# Patient Record
Sex: Female | Born: 1955 | Race: Black or African American | Hispanic: No | Marital: Single | Smoking: Former smoker
Health system: Southern US, Community
[De-identification: ages and names within clinical notes are randomized; demographics above are authoritative.]

## PROBLEM LIST (undated history)

## (undated) DIAGNOSIS — M1990 Unspecified osteoarthritis, unspecified site: Secondary | ICD-10-CM

## (undated) DIAGNOSIS — Z87898 Personal history of other specified conditions: Secondary | ICD-10-CM

## (undated) DIAGNOSIS — I509 Heart failure, unspecified: Secondary | ICD-10-CM

## (undated) DIAGNOSIS — I10 Essential (primary) hypertension: Secondary | ICD-10-CM

## (undated) DIAGNOSIS — I4891 Unspecified atrial fibrillation: Secondary | ICD-10-CM

## (undated) DIAGNOSIS — J449 Chronic obstructive pulmonary disease, unspecified: Secondary | ICD-10-CM

## (undated) DIAGNOSIS — G5600 Carpal tunnel syndrome, unspecified upper limb: Secondary | ICD-10-CM

## (undated) DIAGNOSIS — E876 Hypokalemia: Secondary | ICD-10-CM

## (undated) DIAGNOSIS — F329 Major depressive disorder, single episode, unspecified: Secondary | ICD-10-CM

## (undated) DIAGNOSIS — D696 Thrombocytopenia, unspecified: Secondary | ICD-10-CM

## (undated) HISTORY — DX: Carpal tunnel syndrome, unspecified upper limb: G56.00

## (undated) HISTORY — DX: Unspecified osteoarthritis, unspecified site: M19.90

## (undated) HISTORY — DX: Personal history of other specified conditions: Z87.898

## (undated) HISTORY — PX: CARDIAC CATHETERIZATION: SHX172

## (undated) HISTORY — DX: Chronic obstructive pulmonary disease, unspecified: J44.9

## (undated) HISTORY — DX: Thrombocytopenia, unspecified: D69.6

## (undated) HISTORY — DX: Major depressive disorder, single episode, unspecified: F32.9

## (undated) HISTORY — DX: Unspecified atrial fibrillation: I48.91

## (undated) HISTORY — PX: PITUITARY SURGERY: SHX203

## (undated) HISTORY — DX: Hypokalemia: E87.6

## (undated) HISTORY — DX: Heart failure, unspecified: I50.9

## (undated) HISTORY — DX: Essential (primary) hypertension: I10

## (undated) HISTORY — PX: UTERINE FIBROID SURGERY: SHX826

---

## 2002-02-27 LAB — HM MAMMOGRAPHY

## 2004-07-15 ENCOUNTER — Emergency Department: Payer: Self-pay | Admitting: Emergency Medicine

## 2004-07-24 ENCOUNTER — Emergency Department: Payer: Self-pay | Admitting: Emergency Medicine

## 2006-05-26 ENCOUNTER — Inpatient Hospital Stay: Payer: Self-pay | Admitting: Internal Medicine

## 2006-05-26 ENCOUNTER — Other Ambulatory Visit: Payer: Self-pay

## 2007-10-03 ENCOUNTER — Inpatient Hospital Stay: Payer: Self-pay | Admitting: *Deleted

## 2007-10-03 ENCOUNTER — Other Ambulatory Visit: Payer: Self-pay

## 2007-10-16 ENCOUNTER — Ambulatory Visit: Payer: Self-pay | Admitting: Family

## 2007-10-29 ENCOUNTER — Other Ambulatory Visit: Payer: Self-pay

## 2007-10-29 ENCOUNTER — Emergency Department: Payer: Self-pay | Admitting: Emergency Medicine

## 2008-04-22 ENCOUNTER — Ambulatory Visit: Payer: Self-pay

## 2008-09-20 ENCOUNTER — Ambulatory Visit: Payer: Self-pay

## 2008-12-31 ENCOUNTER — Ambulatory Visit: Payer: Self-pay | Admitting: Cardiology

## 2009-01-01 ENCOUNTER — Ambulatory Visit: Payer: Self-pay | Admitting: Cardiology

## 2009-01-16 ENCOUNTER — Ambulatory Visit: Payer: Self-pay | Admitting: Specialist

## 2009-01-23 ENCOUNTER — Ambulatory Visit: Payer: Self-pay | Admitting: Specialist

## 2009-08-22 ENCOUNTER — Ambulatory Visit: Payer: Self-pay

## 2009-09-21 ENCOUNTER — Ambulatory Visit: Payer: Self-pay

## 2009-10-03 ENCOUNTER — Ambulatory Visit: Payer: Self-pay

## 2009-10-10 ENCOUNTER — Ambulatory Visit: Payer: Self-pay

## 2010-06-13 ENCOUNTER — Emergency Department: Payer: Self-pay | Admitting: Emergency Medicine

## 2010-09-14 ENCOUNTER — Inpatient Hospital Stay: Payer: Self-pay | Admitting: Internal Medicine

## 2010-09-21 ENCOUNTER — Inpatient Hospital Stay: Payer: Self-pay | Admitting: Specialist

## 2010-11-20 ENCOUNTER — Ambulatory Visit: Payer: Self-pay | Admitting: Specialist

## 2011-02-09 ENCOUNTER — Ambulatory Visit: Payer: Self-pay | Admitting: Internal Medicine

## 2011-02-15 ENCOUNTER — Ambulatory Visit: Payer: Self-pay | Admitting: Internal Medicine

## 2011-02-22 ENCOUNTER — Ambulatory Visit: Payer: Self-pay | Admitting: Internal Medicine

## 2012-01-07 DIAGNOSIS — Z8742 Personal history of other diseases of the female genital tract: Secondary | ICD-10-CM | POA: Insufficient documentation

## 2012-01-07 DIAGNOSIS — R9389 Abnormal findings on diagnostic imaging of other specified body structures: Secondary | ICD-10-CM | POA: Insufficient documentation

## 2012-01-07 DIAGNOSIS — Z9889 Other specified postprocedural states: Secondary | ICD-10-CM | POA: Insufficient documentation

## 2012-01-07 DIAGNOSIS — I429 Cardiomyopathy, unspecified: Secondary | ICD-10-CM | POA: Insufficient documentation

## 2012-02-07 ENCOUNTER — Ambulatory Visit: Payer: Self-pay | Admitting: Cardiology

## 2012-02-07 LAB — APTT: Activated PTT: 32 secs (ref 23.6–35.9)

## 2012-02-07 LAB — BASIC METABOLIC PANEL
Anion Gap: 7 (ref 7–16)
BUN: 5 mg/dL — ABNORMAL LOW (ref 7–18)
Calcium, Total: 8.7 mg/dL (ref 8.5–10.1)
EGFR (African American): >60
EGFR (Non-African Amer.): >60
Glucose: 91 mg/dL (ref 65–99)
Potassium: 4.1 mmol/L (ref 3.5–5.1)
Sodium: 134 mmol/L — ABNORMAL LOW (ref 136–145)

## 2012-02-07 LAB — URINALYSIS, COMPLETE
Bacteria: NONE SEEN
Glucose,UR: NEGATIVE mg/dL (ref 0–75)
Leukocyte Esterase: NEGATIVE
Nitrite: NEGATIVE
RBC,UR: 1 /HPF (ref 0–5)
Specific Gravity: 1.005 (ref 1.003–1.030)
WBC UR: NONE SEEN /HPF (ref 0–5)

## 2012-02-07 LAB — CBC WITH DIFFERENTIAL/PLATELET
Basophil %: 0.6 %
Eosinophil #: 0.1 10*3/uL (ref 0.0–0.7)
Eosinophil %: 3.6 %
HGB: 14.6 g/dL (ref 12.0–16.0)
MCH: 36.7 pg — ABNORMAL HIGH (ref 26.0–34.0)
MCV: 108 fL — ABNORMAL HIGH (ref 80–100)
Monocyte #: 0.5 x10 3/mm (ref 0.2–0.9)
Neutrophil #: 1.9 10*3/uL (ref 1.4–6.5)
Neutrophil %: 47.8 %
RBC: 3.99 10*6/uL (ref 3.80–5.20)

## 2012-02-07 LAB — PROTIME-INR: Prothrombin Time: 15.7 secs — ABNORMAL HIGH (ref 11.5–14.7)

## 2012-03-25 ENCOUNTER — Observation Stay: Payer: Self-pay | Admitting: Internal Medicine

## 2012-03-25 LAB — TROPONIN I
Troponin-I: 0.08 ng/mL — ABNORMAL HIGH
Troponin-I: 0.08 ng/mL — ABNORMAL HIGH

## 2012-03-25 LAB — COMPREHENSIVE METABOLIC PANEL
Albumin: 3.7 g/dL (ref 3.4–5.0)
Alkaline Phosphatase: 71 U/L (ref 50–136)
Bilirubin,Total: 0.6 mg/dL (ref 0.2–1.0)
Calcium, Total: 8.7 mg/dL (ref 8.5–10.1)
Chloride: 107 mmol/L (ref 98–107)
Co2: 24 mmol/L (ref 21–32)
Creatinine: 0.59 mg/dL — ABNORMAL LOW (ref 0.60–1.30)
EGFR (Non-African Amer.): >60
Potassium: 4.2 mmol/L (ref 3.5–5.1)
SGOT(AST): 29 U/L (ref 15–37)
SGPT (ALT): 16 U/L (ref 12–78)
Sodium: 141 mmol/L (ref 136–145)
Total Protein: 7.8 g/dL (ref 6.4–8.2)

## 2012-03-25 LAB — DRUG SCREEN, URINE
Amphetamines, Ur Screen: NEGATIVE (ref ?–1000)
Barbiturates, Ur Screen: NEGATIVE (ref ?–200)
MDMA (Ecstasy)Ur Screen: NEGATIVE (ref ?–500)
Methadone, Ur Screen: NEGATIVE (ref ?–300)
Opiate, Ur Screen: NEGATIVE (ref ?–300)
Tricyclic, Ur Screen: NEGATIVE (ref ?–1000)

## 2012-03-25 LAB — CBC
MCH: 37.7 pg — ABNORMAL HIGH (ref 26.0–34.0)
MCHC: 34.4 g/dL (ref 32.0–36.0)
MCV: 110 fL — ABNORMAL HIGH (ref 80–100)
Platelet: 167 10*3/uL (ref 150–440)
RDW: 16.4 % — ABNORMAL HIGH (ref 11.5–14.5)

## 2012-03-25 LAB — CK TOTAL AND CKMB (NOT AT ARMC)
CK, Total: 91 U/L (ref 21–215)
CK-MB: 2.4 ng/mL (ref 0.5–3.6)

## 2012-03-25 LAB — DIGOXIN LEVEL: Digoxin: 1.22 ng/mL

## 2012-03-25 LAB — URINALYSIS, COMPLETE
Bilirubin,UR: NEGATIVE
Blood: NEGATIVE
Leukocyte Esterase: NEGATIVE
Nitrite: NEGATIVE
Ph: 5 (ref 4.5–8.0)
Protein: NEGATIVE
RBC,UR: NONE SEEN /HPF (ref 0–5)
Squamous Epithelial: 26

## 2012-03-25 LAB — ETHANOL
Ethanol %: 0.287 % — ABNORMAL HIGH (ref 0.000–0.080)
Ethanol: 287 mg/dL

## 2012-03-25 LAB — LIPASE, BLOOD: Lipase: 109 U/L

## 2012-03-26 LAB — CBC WITH DIFFERENTIAL/PLATELET
Basophil %: 0.6 %
Eosinophil #: 0.1 10*3/uL (ref 0.0–0.7)
Eosinophil %: 2.6 %
HGB: 14.5 g/dL (ref 12.0–16.0)
Lymphocyte #: 2.2 10*3/uL (ref 1.0–3.6)
Lymphocyte %: 48.6 %
Monocyte #: 0.4 x10 3/mm (ref 0.2–0.9)
Monocyte %: 9.4 %
Neutrophil #: 1.8 10*3/uL (ref 1.4–6.5)
Neutrophil %: 38.8 %
Platelet: 136 10*3/uL — ABNORMAL LOW (ref 150–440)
RBC: 3.95 10*6/uL (ref 3.80–5.20)

## 2012-03-26 LAB — BASIC METABOLIC PANEL
Anion Gap: 8 (ref 7–16)
BUN: 9 mg/dL (ref 7–18)
Calcium, Total: 8.7 mg/dL (ref 8.5–10.1)
Chloride: 109 mmol/L — ABNORMAL HIGH (ref 98–107)
Creatinine: 0.63 mg/dL (ref 0.60–1.30)
EGFR (African American): >60
EGFR (Non-African Amer.): >60
Glucose: 90 mg/dL (ref 65–99)
Potassium: 3.6 mmol/L (ref 3.5–5.1)

## 2012-03-26 LAB — CK TOTAL AND CKMB (NOT AT ARMC): CK-MB: 0.9 ng/mL (ref 0.5–3.6)

## 2012-03-26 LAB — MAGNESIUM: Magnesium: 1.6 mg/dL — ABNORMAL LOW

## 2012-03-26 LAB — LIPID PANEL
Cholesterol: 161 mg/dL (ref 0–200)
Ldl Cholesterol, Calc: 73 mg/dL (ref 0–100)
Triglycerides: 149 mg/dL (ref 0–200)

## 2013-03-02 ENCOUNTER — Emergency Department: Payer: Self-pay | Admitting: Emergency Medicine

## 2013-03-02 LAB — CBC
HGB: 14.1 g/dL (ref 12.0–16.0)
MCH: 36.8 pg — ABNORMAL HIGH (ref 26.0–34.0)
MCHC: 34.2 g/dL (ref 32.0–36.0)
RBC: 3.84 10*6/uL (ref 3.80–5.20)

## 2013-03-02 LAB — BASIC METABOLIC PANEL
Calcium, Total: 8.9 mg/dL (ref 8.5–10.1)
Chloride: 106 mmol/L (ref 98–107)
Co2: 23 mmol/L (ref 21–32)
Creatinine: 0.53 mg/dL — ABNORMAL LOW (ref 0.60–1.30)
EGFR (African American): >60
Osmolality: 272 (ref 275–301)

## 2013-09-21 ENCOUNTER — Ambulatory Visit
Admit: 2013-09-21 | Disposition: A | Discharge status: Home / Self Care | Payer: Self-pay | Attending: Nurse Practitioner | Admitting: Nurse Practitioner

## 2013-09-21 ENCOUNTER — Ambulatory Visit: Payer: Self-pay | Admitting: Internal Medicine

## 2013-09-25 ENCOUNTER — Inpatient Hospital Stay: Payer: Self-pay | Admitting: Internal Medicine

## 2013-09-25 LAB — URINALYSIS, COMPLETE
BILIRUBIN, UR: NEGATIVE
Blood: NEGATIVE
Glucose,UR: NEGATIVE mg/dL (ref 0–75)
Hyaline Cast: 42
Ketone: NEGATIVE
Leukocyte Esterase: NEGATIVE
Nitrite: NEGATIVE
Ph: 5 (ref 4.5–8.0)
Protein: 100
SPECIFIC GRAVITY: 1.015 (ref 1.003–1.030)
Squamous Epithelial: 2
WBC UR: 4 /HPF (ref 0–5)

## 2013-09-25 LAB — TROPONIN I
Troponin-I: 0.08 ng/mL — ABNORMAL HIGH
Troponin-I: 0.09 ng/mL — ABNORMAL HIGH
Troponin-I: 0.06 ng/mL — ABNORMAL HIGH

## 2013-09-25 LAB — COMPREHENSIVE METABOLIC PANEL
ALT: 289 U/L — AB (ref 12–78)
ANION GAP: 12 (ref 7–16)
AST: 463 U/L — AB (ref 15–37)
Albumin: 4.1 g/dL (ref 3.4–5.0)
Alkaline Phosphatase: 118 U/L — ABNORMAL HIGH
BUN: 25 mg/dL — AB (ref 7–18)
Bilirubin,Total: 5.6 mg/dL — ABNORMAL HIGH (ref 0.2–1.0)
CALCIUM: 9.6 mg/dL (ref 8.5–10.1)
CHLORIDE: 88 mmol/L — AB (ref 98–107)
CREATININE: 1.57 mg/dL — AB (ref 0.60–1.30)
Co2: 19 mmol/L — ABNORMAL LOW (ref 21–32)
EGFR (African American): 41 — ABNORMAL LOW
EGFR (Non-African Amer.): 35 — ABNORMAL LOW
Glucose: 74 mg/dL (ref 65–99)
Osmolality: 243 (ref 275–301)
POTASSIUM: 4.4 mmol/L (ref 3.5–5.1)
SODIUM: 119 mmol/L — AB (ref 136–145)
Total Protein: 7.5 g/dL (ref 6.4–8.2)

## 2013-09-25 LAB — CBC
HCT: 40.9 % (ref 35.0–47.0)
HGB: 13.6 g/dL (ref 12.0–16.0)
MCH: 36 pg — ABNORMAL HIGH (ref 26.0–34.0)
MCHC: 33.3 g/dL (ref 32.0–36.0)
MCV: 108 fL — ABNORMAL HIGH (ref 80–100)
Platelet: 77 10*3/uL — ABNORMAL LOW (ref 150–440)
RBC: 3.79 10*6/uL — AB (ref 3.80–5.20)
RDW: 18.2 % — ABNORMAL HIGH (ref 11.5–14.5)
WBC: 4.4 10*3/uL (ref 3.6–11.0)

## 2013-09-25 LAB — SODIUM
Sodium: 121 mmol/L — ABNORMAL LOW (ref 136–145)
Sodium: 120 mmol/L — CL (ref 136–145)

## 2013-09-26 LAB — TSH: THYROID STIMULATING HORM: 1.73 u[IU]/mL

## 2013-09-26 LAB — COMPREHENSIVE METABOLIC PANEL
ALK PHOS: 98 U/L
Albumin: 3.2 g/dL — ABNORMAL LOW (ref 3.4–5.0)
Anion Gap: 10 (ref 7–16)
BUN: 23 mg/dL — ABNORMAL HIGH (ref 7–18)
Bilirubin,Total: 4.2 mg/dL — ABNORMAL HIGH (ref 0.2–1.0)
CALCIUM: 9 mg/dL (ref 8.5–10.1)
CHLORIDE: 92 mmol/L — AB (ref 98–107)
Co2: 20 mmol/L — ABNORMAL LOW (ref 21–32)
Creatinine: 1.54 mg/dL — ABNORMAL HIGH (ref 0.60–1.30)
EGFR (African American): 42 — ABNORMAL LOW
GFR CALC NON AF AMER: 36 — AB
Glucose: 104 mg/dL — ABNORMAL HIGH (ref 65–99)
OSMOLALITY: 250 (ref 275–301)
Potassium: 3.7 mmol/L (ref 3.5–5.1)
SGOT(AST): 258 U/L — ABNORMAL HIGH (ref 15–37)
SGPT (ALT): 200 U/L — ABNORMAL HIGH (ref 12–78)
Sodium: 122 mmol/L — ABNORMAL LOW (ref 136–145)
TOTAL PROTEIN: 6.2 g/dL — AB (ref 6.4–8.2)

## 2013-09-26 LAB — PROTIME-INR
INR: 1.7
Prothrombin Time: 19.9 secs — ABNORMAL HIGH (ref 11.5–14.7)

## 2013-09-26 LAB — CBC WITH DIFFERENTIAL/PLATELET
BASOS ABS: 0 10*3/uL (ref 0.0–0.1)
BASOS PCT: 1.1 %
EOS PCT: 1.8 %
Eosinophil #: 0.1 10*3/uL (ref 0.0–0.7)
HCT: 37.6 % (ref 35.0–47.0)
HGB: 12.6 g/dL (ref 12.0–16.0)
Lymphocyte #: 0.4 10*3/uL — ABNORMAL LOW (ref 1.0–3.6)
Lymphocyte %: 10.8 %
MCH: 36.1 pg — AB (ref 26.0–34.0)
MCHC: 33.6 g/dL (ref 32.0–36.0)
MCV: 108 fL — AB (ref 80–100)
MONOS PCT: 10.9 %
Monocyte #: 0.4 x10 3/mm (ref 0.2–0.9)
NEUTROS ABS: 2.5 10*3/uL (ref 1.4–6.5)
Neutrophil %: 75.4 %
Platelet: 63 10*3/uL — ABNORMAL LOW (ref 150–440)
RBC: 3.49 10*6/uL — ABNORMAL LOW (ref 3.80–5.20)
RDW: 17.8 % — AB (ref 11.5–14.5)
WBC: 3.3 10*3/uL — ABNORMAL LOW (ref 3.6–11.0)

## 2013-09-26 LAB — SODIUM
Sodium: 123 mmol/L — ABNORMAL LOW (ref 136–145)
Sodium: 125 mmol/L — ABNORMAL LOW (ref 136–145)

## 2013-09-26 LAB — MAGNESIUM: MAGNESIUM: 1.6 mg/dL — AB

## 2013-09-27 LAB — COMPREHENSIVE METABOLIC PANEL
ALK PHOS: 95 U/L
ALT: 160 U/L — AB (ref 12–78)
ANION GAP: 9 (ref 7–16)
Albumin: 3 g/dL — ABNORMAL LOW (ref 3.4–5.0)
BILIRUBIN TOTAL: 2.9 mg/dL — AB (ref 0.2–1.0)
BUN: 25 mg/dL — ABNORMAL HIGH (ref 7–18)
CO2: 20 mmol/L — AB (ref 21–32)
Calcium, Total: 8.7 mg/dL (ref 8.5–10.1)
Chloride: 93 mmol/L — ABNORMAL LOW (ref 98–107)
Creatinine: 1.52 mg/dL — ABNORMAL HIGH (ref 0.60–1.30)
EGFR (African American): 43 — ABNORMAL LOW
EGFR (Non-African Amer.): 37 — ABNORMAL LOW
Glucose: 100 mg/dL — ABNORMAL HIGH (ref 65–99)
OSMOLALITY: 250 (ref 275–301)
Potassium: 3.6 mmol/L (ref 3.5–5.1)
SGOT(AST): 149 U/L — ABNORMAL HIGH (ref 15–37)
Sodium: 122 mmol/L — ABNORMAL LOW (ref 136–145)
Total Protein: 6.2 g/dL — ABNORMAL LOW (ref 6.4–8.2)

## 2013-09-27 LAB — URINE CULTURE

## 2013-09-27 LAB — SODIUM: SODIUM: 122 mmol/L — AB (ref 136–145)

## 2013-09-28 LAB — BASIC METABOLIC PANEL
ANION GAP: 8 (ref 7–16)
Anion Gap: 11 (ref 7–16)
BUN: 24 mg/dL — AB (ref 7–18)
BUN: 23 mg/dL — ABNORMAL HIGH (ref 7–18)
CALCIUM: 9 mg/dL (ref 8.5–10.1)
CO2: 21 mmol/L (ref 21–32)
CREATININE: 1.51 mg/dL — AB (ref 0.60–1.30)
Calcium, Total: 8.7 mg/dL (ref 8.5–10.1)
Chloride: 91 mmol/L — ABNORMAL LOW (ref 98–107)
Chloride: 92 mmol/L — ABNORMAL LOW (ref 98–107)
Co2: 24 mmol/L (ref 21–32)
Creatinine: 1.34 mg/dL — ABNORMAL HIGH (ref 0.60–1.30)
EGFR (African American): 43 — ABNORMAL LOW
EGFR (Non-African Amer.): 43 — ABNORMAL LOW
EGFR (Non-African Amer.): 37 — ABNORMAL LOW
GFR CALC AF AMER: 50 — AB
Glucose: 88 mg/dL (ref 65–99)
Glucose: 107 mg/dL — ABNORMAL HIGH (ref 65–99)
Osmolality: 251 (ref 275–301)
Osmolality: 254 (ref 275–301)
POTASSIUM: 3.5 mmol/L (ref 3.5–5.1)
Potassium: 3.7 mmol/L (ref 3.5–5.1)
SODIUM: 123 mmol/L — AB (ref 136–145)
Sodium: 124 mmol/L — ABNORMAL LOW (ref 136–145)

## 2013-09-28 LAB — SODIUM: SODIUM: 124 mmol/L — AB (ref 136–145)

## 2013-09-29 LAB — BASIC METABOLIC PANEL
ANION GAP: 11 (ref 7–16)
BUN: 25 mg/dL — AB (ref 7–18)
CALCIUM: 8.8 mg/dL (ref 8.5–10.1)
CO2: 23 mmol/L (ref 21–32)
CREATININE: 1.31 mg/dL — AB (ref 0.60–1.30)
Chloride: 93 mmol/L — ABNORMAL LOW (ref 98–107)
EGFR (African American): 51 — ABNORMAL LOW
EGFR (Non-African Amer.): 44 — ABNORMAL LOW
GLUCOSE: 94 mg/dL (ref 65–99)
Osmolality: 259 (ref 275–301)
Potassium: 3.4 mmol/L — ABNORMAL LOW (ref 3.5–5.1)
SODIUM: 127 mmol/L — AB (ref 136–145)

## 2013-09-29 LAB — CBC WITH DIFFERENTIAL/PLATELET
Basophil #: 0 10*3/uL (ref 0.0–0.1)
Basophil %: 0.6 %
EOS PCT: 1.9 %
Eosinophil #: 0.1 10*3/uL (ref 0.0–0.7)
HCT: 40.1 % (ref 35.0–47.0)
HGB: 13.1 g/dL (ref 12.0–16.0)
LYMPHS ABS: 0.8 10*3/uL — AB (ref 1.0–3.6)
LYMPHS PCT: 15.2 %
MCH: 35.4 pg — ABNORMAL HIGH (ref 26.0–34.0)
MCHC: 32.8 g/dL (ref 32.0–36.0)
MCV: 108 fL — ABNORMAL HIGH (ref 80–100)
MONO ABS: 0.8 x10 3/mm (ref 0.2–0.9)
MONOS PCT: 14.2 %
NEUTROS ABS: 3.7 10*3/uL (ref 1.4–6.5)
Neutrophil %: 68.1 %
PLATELETS: 70 10*3/uL — AB (ref 150–440)
RBC: 3.71 10*6/uL — AB (ref 3.80–5.20)
RDW: 18.8 % — AB (ref 11.5–14.5)
WBC: 5.4 10*3/uL (ref 3.6–11.0)

## 2013-09-29 LAB — SODIUM: Sodium: 127 mmol/L — ABNORMAL LOW (ref 136–145)

## 2013-09-30 LAB — BASIC METABOLIC PANEL
Anion Gap: 9 (ref 7–16)
BUN: 24 mg/dL — ABNORMAL HIGH (ref 7–18)
CALCIUM: 8.6 mg/dL (ref 8.5–10.1)
Chloride: 94 mmol/L — ABNORMAL LOW (ref 98–107)
Co2: 26 mmol/L (ref 21–32)
Creatinine: 1.01 mg/dL (ref 0.60–1.30)
EGFR (African American): >60
EGFR (Non-African Amer.): >60
Glucose: 136 mg/dL — ABNORMAL HIGH (ref 65–99)
Osmolality: 265 (ref 275–301)
Potassium: 2.8 mmol/L — ABNORMAL LOW (ref 3.5–5.1)
Sodium: 129 mmol/L — ABNORMAL LOW (ref 136–145)

## 2013-09-30 LAB — CULTURE, BLOOD (SINGLE)

## 2013-09-30 LAB — SODIUM
SODIUM: 128 mmol/L — AB (ref 136–145)
SODIUM: 128 mmol/L — AB (ref 136–145)

## 2013-10-01 LAB — BASIC METABOLIC PANEL
ANION GAP: 7 (ref 7–16)
BUN: 26 mg/dL — AB (ref 7–18)
CALCIUM: 8.9 mg/dL (ref 8.5–10.1)
CREATININE: 0.92 mg/dL (ref 0.60–1.30)
Chloride: 94 mmol/L — ABNORMAL LOW (ref 98–107)
Co2: 26 mmol/L (ref 21–32)
Glucose: 92 mg/dL (ref 65–99)
Osmolality: 260 (ref 275–301)
POTASSIUM: 4.7 mmol/L (ref 3.5–5.1)
SODIUM: 127 mmol/L — AB (ref 136–145)

## 2013-10-01 LAB — PROTEIN ELECTROPHORESIS(ARMC)

## 2013-10-01 LAB — SODIUM: SODIUM: 126 mmol/L — AB (ref 136–145)

## 2013-10-01 LAB — UR PROT ELECTROPHORESIS, URINE RANDOM

## 2013-10-02 LAB — BASIC METABOLIC PANEL
Anion Gap: 6 — ABNORMAL LOW (ref 7–16)
BUN: 27 mg/dL — AB (ref 7–18)
CREATININE: 1.28 mg/dL (ref 0.60–1.30)
Calcium, Total: 8.9 mg/dL (ref 8.5–10.1)
Chloride: 94 mmol/L — ABNORMAL LOW (ref 98–107)
Co2: 26 mmol/L (ref 21–32)
EGFR (African American): 53 — ABNORMAL LOW
GFR CALC NON AF AMER: 45 — AB
GLUCOSE: 106 mg/dL — AB (ref 65–99)
Osmolality: 259 (ref 275–301)
POTASSIUM: 5.1 mmol/L (ref 3.5–5.1)
Sodium: 126 mmol/L — ABNORMAL LOW (ref 136–145)

## 2013-10-02 LAB — CBC WITH DIFFERENTIAL/PLATELET
Basophil #: 0 10*3/uL (ref 0.0–0.1)
Basophil %: 0.4 %
EOS ABS: 0.1 10*3/uL (ref 0.0–0.7)
EOS PCT: 1.4 %
HCT: 38.4 % (ref 35.0–47.0)
HGB: 12.9 g/dL (ref 12.0–16.0)
Lymphocyte #: 1.1 10*3/uL (ref 1.0–3.6)
Lymphocyte %: 23 %
MCH: 36.2 pg — AB (ref 26.0–34.0)
MCHC: 33.6 g/dL (ref 32.0–36.0)
MCV: 108 fL — ABNORMAL HIGH (ref 80–100)
MONO ABS: 0.6 x10 3/mm (ref 0.2–0.9)
MONOS PCT: 13.3 %
Neutrophil #: 2.8 10*3/uL (ref 1.4–6.5)
Neutrophil %: 61.9 %
PLATELETS: 74 10*3/uL — AB (ref 150–440)
RBC: 3.56 10*6/uL — ABNORMAL LOW (ref 3.80–5.20)
RDW: 18.8 % — ABNORMAL HIGH (ref 11.5–14.5)
WBC: 4.6 10*3/uL (ref 3.6–11.0)

## 2013-10-02 LAB — SODIUM: Sodium: 127 mmol/L — ABNORMAL LOW (ref 136–145)

## 2013-10-03 LAB — BASIC METABOLIC PANEL
Anion Gap: 6 — ABNORMAL LOW (ref 7–16)
BUN: 30 mg/dL — AB (ref 7–18)
CO2: 26 mmol/L (ref 21–32)
Calcium, Total: 8.9 mg/dL (ref 8.5–10.1)
Chloride: 93 mmol/L — ABNORMAL LOW (ref 98–107)
Creatinine: 1.2 mg/dL (ref 0.60–1.30)
EGFR (African American): 57 — ABNORMAL LOW
GFR CALC NON AF AMER: 49 — AB
Glucose: 86 mg/dL (ref 65–99)
Osmolality: 257 (ref 275–301)
POTASSIUM: 5.3 mmol/L — AB (ref 3.5–5.1)
Sodium: 125 mmol/L — ABNORMAL LOW (ref 136–145)

## 2013-10-04 LAB — BASIC METABOLIC PANEL
Anion Gap: 7 (ref 7–16)
BUN: 33 mg/dL — AB (ref 7–18)
CREATININE: 1.23 mg/dL (ref 0.60–1.30)
Calcium, Total: 9 mg/dL (ref 8.5–10.1)
Chloride: 92 mmol/L — ABNORMAL LOW (ref 98–107)
Co2: 26 mmol/L (ref 21–32)
EGFR (African American): 55 — ABNORMAL LOW
EGFR (Non-African Amer.): 48 — ABNORMAL LOW
Glucose: 99 mg/dL (ref 65–99)
Osmolality: 259 (ref 275–301)
POTASSIUM: 4.5 mmol/L (ref 3.5–5.1)
SODIUM: 125 mmol/L — AB (ref 136–145)

## 2013-10-05 LAB — BASIC METABOLIC PANEL
Anion Gap: 7 (ref 7–16)
BUN: 34 mg/dL — AB (ref 7–18)
CALCIUM: 9.1 mg/dL (ref 8.5–10.1)
Chloride: 93 mmol/L — ABNORMAL LOW (ref 98–107)
Co2: 26 mmol/L (ref 21–32)
Creatinine: 1.2 mg/dL (ref 0.60–1.30)
EGFR (African American): 57 — ABNORMAL LOW
EGFR (Non-African Amer.): 49 — ABNORMAL LOW
GLUCOSE: 84 mg/dL (ref 65–99)
Osmolality: 260 (ref 275–301)
POTASSIUM: 4.4 mmol/L (ref 3.5–5.1)
SODIUM: 126 mmol/L — AB (ref 136–145)

## 2013-10-06 LAB — BASIC METABOLIC PANEL
Anion Gap: 6 — ABNORMAL LOW (ref 7–16)
BUN: 36 mg/dL — ABNORMAL HIGH (ref 7–18)
CALCIUM: 9.3 mg/dL (ref 8.5–10.1)
CHLORIDE: 93 mmol/L — AB (ref 98–107)
CREATININE: 1.34 mg/dL — AB (ref 0.60–1.30)
Co2: 28 mmol/L (ref 21–32)
EGFR (Non-African Amer.): 43 — ABNORMAL LOW
GFR CALC AF AMER: 50 — AB
GLUCOSE: 93 mg/dL (ref 65–99)
Osmolality: 263 (ref 275–301)
POTASSIUM: 4.1 mmol/L (ref 3.5–5.1)
Sodium: 127 mmol/L — ABNORMAL LOW (ref 136–145)

## 2013-10-07 LAB — BASIC METABOLIC PANEL
Anion Gap: 8 (ref 7–16)
BUN: 37 mg/dL — AB (ref 7–18)
CHLORIDE: 93 mmol/L — AB (ref 98–107)
CO2: 28 mmol/L (ref 21–32)
CREATININE: 1.34 mg/dL — AB (ref 0.60–1.30)
Calcium, Total: 9.3 mg/dL (ref 8.5–10.1)
EGFR (African American): 50 — ABNORMAL LOW
GFR CALC NON AF AMER: 43 — AB
Glucose: 70 mg/dL (ref 65–99)
Osmolality: 266 (ref 275–301)
Potassium: 3.8 mmol/L (ref 3.5–5.1)
SODIUM: 129 mmol/L — AB (ref 136–145)

## 2013-10-07 LAB — CLOSTRIDIUM DIFFICILE(ARMC)

## 2013-10-08 LAB — BASIC METABOLIC PANEL
Anion Gap: 7 (ref 7–16)
BUN: 39 mg/dL — ABNORMAL HIGH (ref 7–18)
CREATININE: 1.35 mg/dL — AB (ref 0.60–1.30)
Calcium, Total: 9.2 mg/dL (ref 8.5–10.1)
Chloride: 93 mmol/L — ABNORMAL LOW (ref 98–107)
Co2: 30 mmol/L (ref 21–32)
EGFR (African American): 49 — ABNORMAL LOW
EGFR (Non-African Amer.): 43 — ABNORMAL LOW
Glucose: 98 mg/dL (ref 65–99)
Osmolality: 270 (ref 275–301)
Potassium: 3.8 mmol/L (ref 3.5–5.1)
Sodium: 130 mmol/L — ABNORMAL LOW (ref 136–145)

## 2013-10-09 LAB — CBC WITH DIFFERENTIAL/PLATELET
BASOS PCT: 1 %
Basophil #: 0 10*3/uL (ref 0.0–0.1)
EOS ABS: 0.1 10*3/uL (ref 0.0–0.7)
Eosinophil %: 2.7 %
HCT: 33.6 % — AB (ref 35.0–47.0)
HGB: 11.4 g/dL — ABNORMAL LOW (ref 12.0–16.0)
Lymphocyte #: 0.9 10*3/uL — ABNORMAL LOW (ref 1.0–3.6)
Lymphocyte %: 21.6 %
MCH: 36.4 pg — AB (ref 26.0–34.0)
MCHC: 34 g/dL (ref 32.0–36.0)
MCV: 107 fL — ABNORMAL HIGH (ref 80–100)
MONOS PCT: 8.7 %
Monocyte #: 0.4 x10 3/mm (ref 0.2–0.9)
NEUTROS PCT: 66 %
Neutrophil #: 2.7 10*3/uL (ref 1.4–6.5)
PLATELETS: 68 10*3/uL — AB (ref 150–440)
RBC: 3.13 10*6/uL — AB (ref 3.80–5.20)
RDW: 18 % — AB (ref 11.5–14.5)
WBC: 4.1 10*3/uL (ref 3.6–11.0)

## 2013-10-09 LAB — BASIC METABOLIC PANEL
Anion Gap: 4 — ABNORMAL LOW (ref 7–16)
BUN: 38 mg/dL — ABNORMAL HIGH (ref 7–18)
CREATININE: 1.17 mg/dL (ref 0.60–1.30)
Calcium, Total: 9.2 mg/dL (ref 8.5–10.1)
Chloride: 92 mmol/L — ABNORMAL LOW (ref 98–107)
Co2: 33 mmol/L — ABNORMAL HIGH (ref 21–32)
EGFR (African American): 59 — ABNORMAL LOW
GFR CALC NON AF AMER: 51 — AB
GLUCOSE: 81 mg/dL (ref 65–99)
OSMOLALITY: 267 (ref 275–301)
POTASSIUM: 4.2 mmol/L (ref 3.5–5.1)
SODIUM: 129 mmol/L — AB (ref 136–145)

## 2013-10-09 LAB — ALBUMIN: Albumin: 4.2 g/dL (ref 3.4–5.0)

## 2013-10-11 LAB — URINALYSIS, COMPLETE
BILIRUBIN, UR: NEGATIVE
GLUCOSE, UR: NEGATIVE mg/dL (ref 0–75)
KETONE: NEGATIVE
Nitrite: NEGATIVE
Ph: 6 (ref 4.5–8.0)
Protein: NEGATIVE
Specific Gravity: 1.008 (ref 1.003–1.030)

## 2013-10-11 LAB — CBC WITH DIFFERENTIAL/PLATELET
BASOS ABS: 0 10*3/uL (ref 0.0–0.1)
BASOS PCT: 1.1 %
EOS PCT: 2.4 %
Eosinophil #: 0.1 10*3/uL (ref 0.0–0.7)
HCT: 31.9 % — AB (ref 35.0–47.0)
HGB: 10.8 g/dL — ABNORMAL LOW (ref 12.0–16.0)
LYMPHS ABS: 0.8 10*3/uL — AB (ref 1.0–3.6)
Lymphocyte %: 19.7 %
MCH: 35.9 pg — AB (ref 26.0–34.0)
MCHC: 33.7 g/dL (ref 32.0–36.0)
MCV: 107 fL — ABNORMAL HIGH (ref 80–100)
Monocyte #: 0.4 x10 3/mm (ref 0.2–0.9)
Monocyte %: 8.9 %
NEUTROS ABS: 2.9 10*3/uL (ref 1.4–6.5)
Neutrophil %: 67.9 %
Platelet: 64 10*3/uL — ABNORMAL LOW (ref 150–440)
RBC: 3 10*6/uL — ABNORMAL LOW (ref 3.80–5.20)
RDW: 18.3 % — ABNORMAL HIGH (ref 11.5–14.5)
WBC: 4.3 10*3/uL (ref 3.6–11.0)

## 2013-10-11 LAB — BASIC METABOLIC PANEL
ANION GAP: 8 (ref 7–16)
BUN: 38 mg/dL — ABNORMAL HIGH (ref 7–18)
CO2: 32 mmol/L (ref 21–32)
CREATININE: 1.07 mg/dL (ref 0.60–1.30)
Calcium, Total: 9.4 mg/dL (ref 8.5–10.1)
Chloride: 95 mmol/L — ABNORMAL LOW (ref 98–107)
EGFR (Non-African Amer.): 56 — ABNORMAL LOW
GLUCOSE: 79 mg/dL (ref 65–99)
OSMOLALITY: 278 (ref 275–301)
Potassium: 3.5 mmol/L (ref 3.5–5.1)
Sodium: 135 mmol/L — ABNORMAL LOW (ref 136–145)

## 2013-10-12 LAB — CBC WITH DIFFERENTIAL/PLATELET
Basophil #: 0.1 10*3/uL (ref 0.0–0.1)
Basophil %: 1.5 %
Eosinophil #: 0.1 10*3/uL (ref 0.0–0.7)
Eosinophil %: 1.7 %
HCT: 34.7 % — ABNORMAL LOW (ref 35.0–47.0)
HGB: 11.4 g/dL — ABNORMAL LOW (ref 12.0–16.0)
Lymphocyte #: 0.7 10*3/uL — ABNORMAL LOW (ref 1.0–3.6)
Lymphocyte %: 14.1 %
MCH: 35.4 pg — ABNORMAL HIGH (ref 26.0–34.0)
MCHC: 32.8 g/dL (ref 32.0–36.0)
MCV: 108 fL — ABNORMAL HIGH (ref 80–100)
MONOS PCT: 9 %
Monocyte #: 0.4 x10 3/mm (ref 0.2–0.9)
Neutrophil #: 3.5 10*3/uL (ref 1.4–6.5)
Neutrophil %: 73.7 %
Platelet: 79 10*3/uL — ABNORMAL LOW (ref 150–440)
RBC: 3.22 10*6/uL — AB (ref 3.80–5.20)
RDW: 17.9 % — AB (ref 11.5–14.5)
WBC: 4.8 10*3/uL (ref 3.6–11.0)

## 2013-10-12 LAB — BASIC METABOLIC PANEL
Anion Gap: 6 — ABNORMAL LOW (ref 7–16)
BUN: 34 mg/dL — ABNORMAL HIGH (ref 7–18)
CHLORIDE: 99 mmol/L (ref 98–107)
CREATININE: 1.18 mg/dL (ref 0.60–1.30)
Calcium, Total: 9.1 mg/dL (ref 8.5–10.1)
Co2: 32 mmol/L (ref 21–32)
EGFR (African American): 58 — ABNORMAL LOW
EGFR (Non-African Amer.): 50 — ABNORMAL LOW
Glucose: 72 mg/dL (ref 65–99)
Osmolality: 280 (ref 275–301)
POTASSIUM: 3.6 mmol/L (ref 3.5–5.1)
SODIUM: 137 mmol/L (ref 136–145)

## 2013-10-13 LAB — URINE CULTURE

## 2013-10-16 LAB — HM PAP SMEAR

## 2013-10-22 ENCOUNTER — Ambulatory Visit
Admit: 2013-10-22 | Disposition: A | Discharge status: Home / Self Care | Payer: Self-pay | Attending: Nurse Practitioner | Admitting: Nurse Practitioner

## 2013-10-22 ENCOUNTER — Ambulatory Visit: Payer: Self-pay | Admitting: Internal Medicine

## 2013-10-31 IMAGING — CR DG CHEST 2V
1 series · 2 of 2 positions shown · non-contrast
Comparison: none

REASON FOR EXAM: Chest Pain
COMMENTS:

PROCEDURE:     DXR - DXR CHEST PA (OR AP) AND LATERAL  - March 02, 2013  [DATE]
RESULT:     Comparison: None

[Series 1: w chest pa · 0.14mm/px · 2 of 2 slices shown]
[im 1/2]
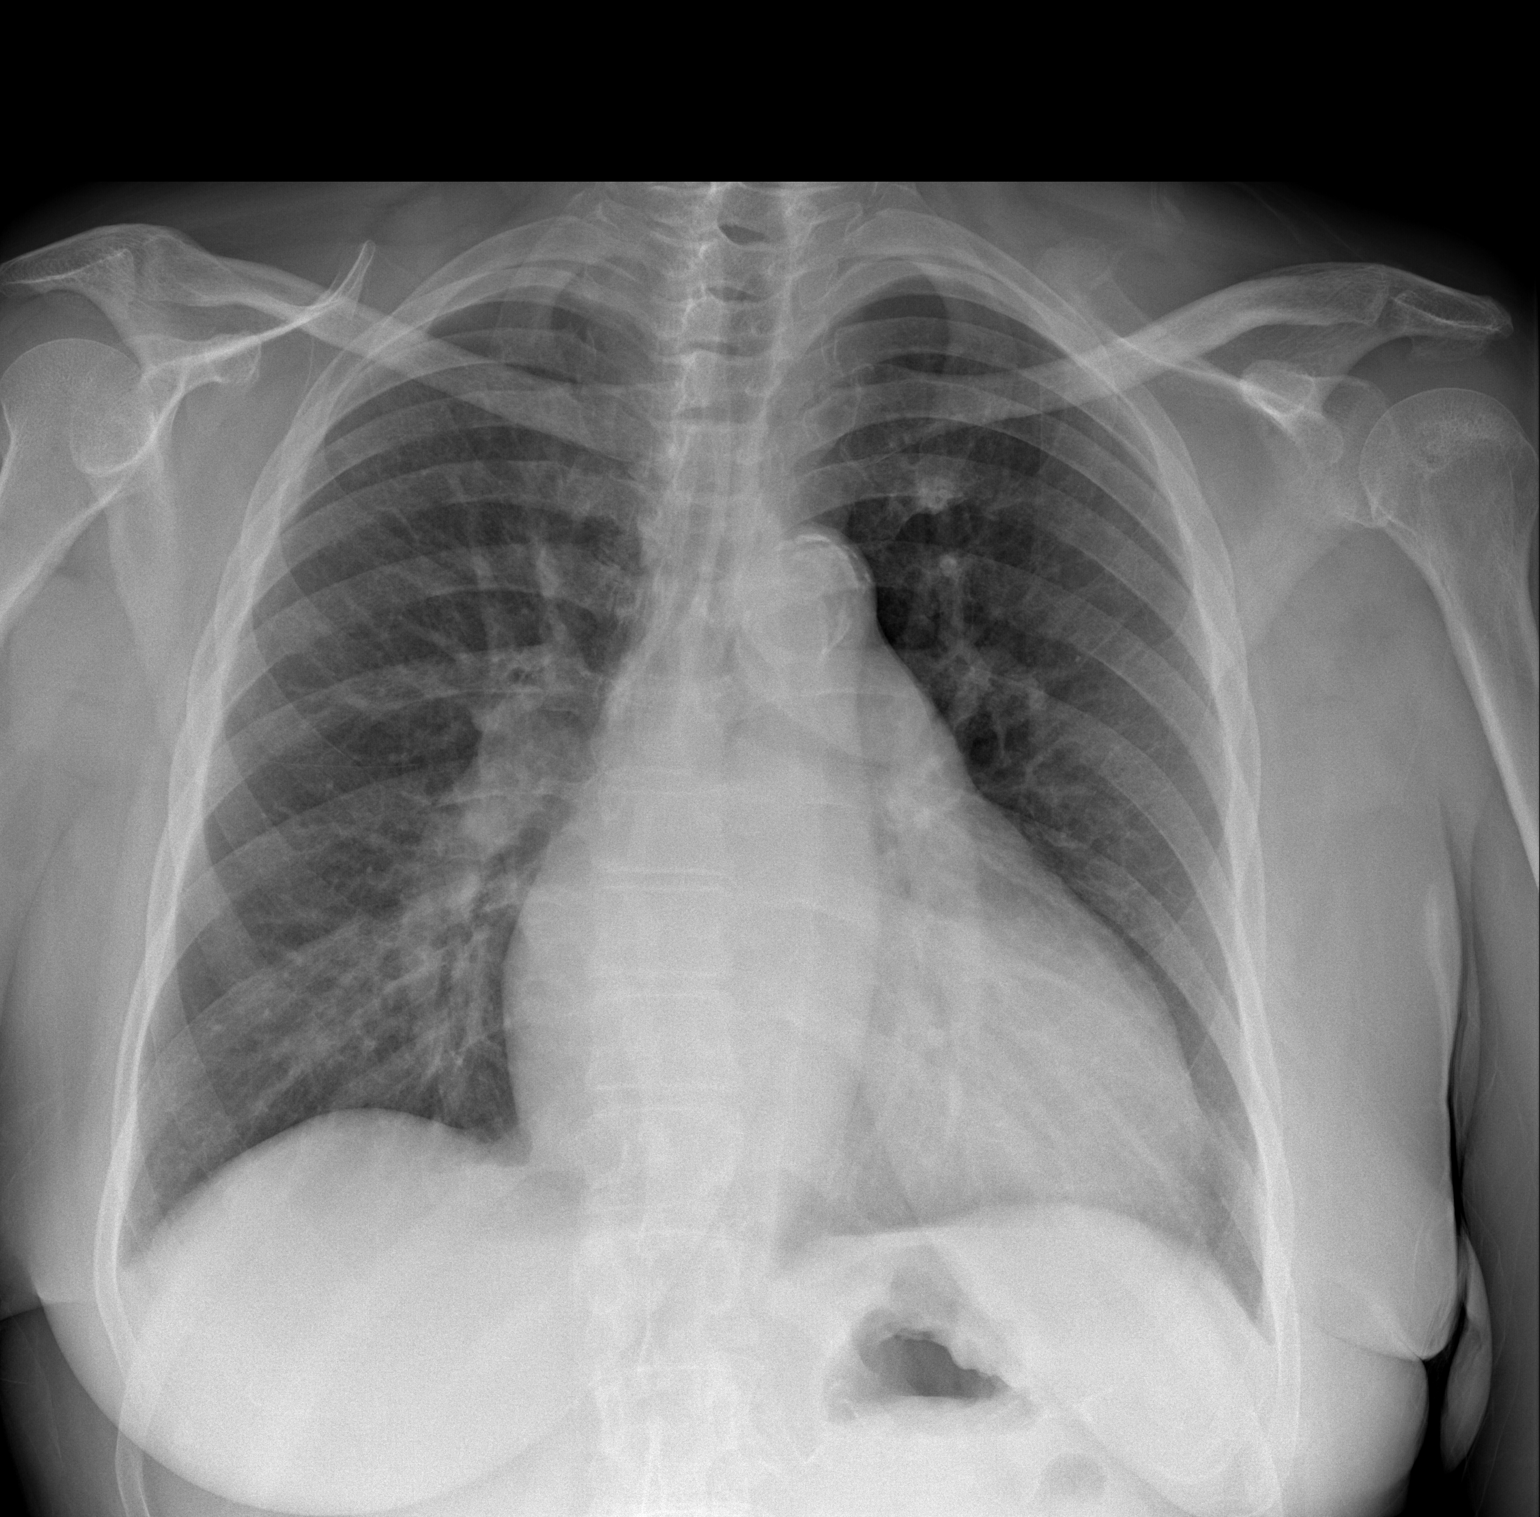
[im 2/2]
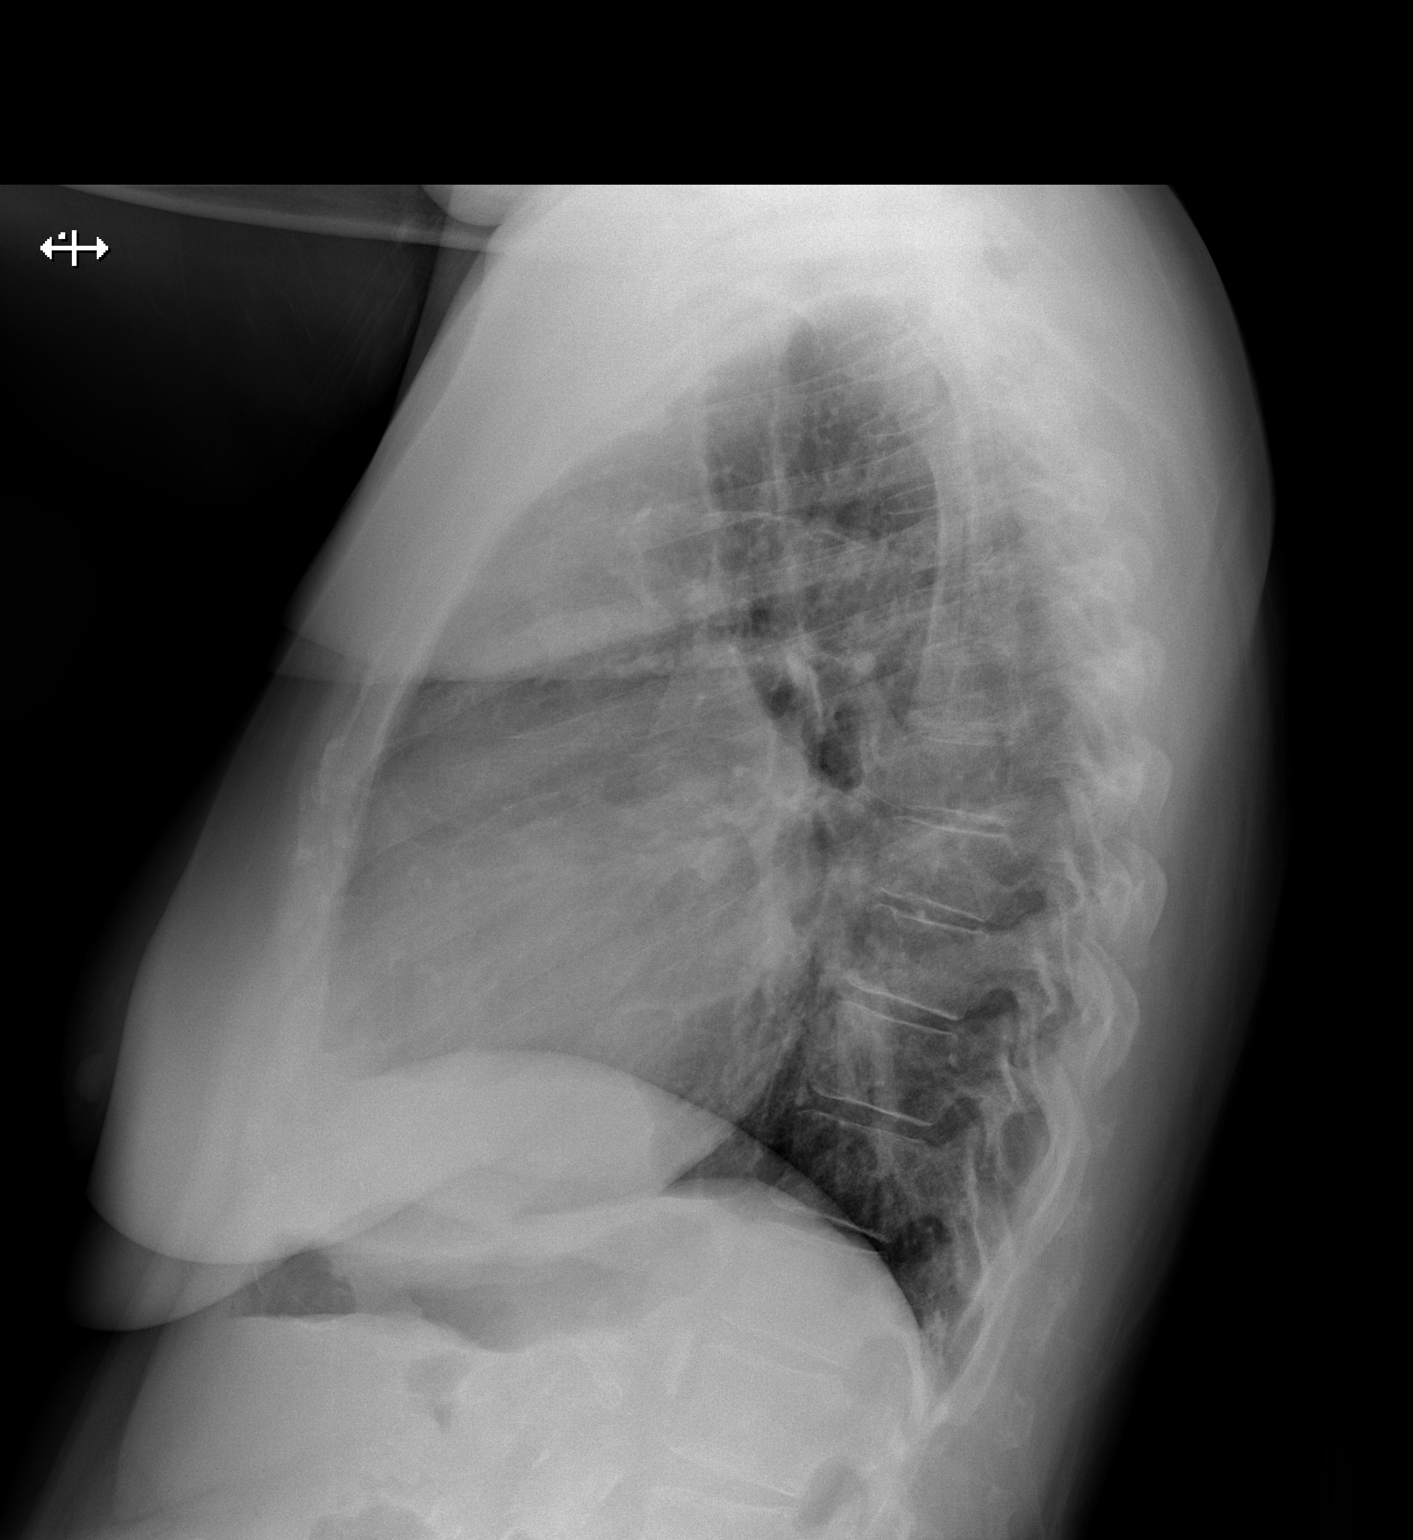

[2 of 2 positions shown; findings below may reference images not displayed]

FINDINGS: PA and lateral chest radiographs are provided.  There is no focal
parenchymal opacity, pleural effusion, or pneumothorax. The heart size is
enlarged..  The osseous structures are unremarkable.
IMPRESSION: No acute disease of the che[REDACTED]

## 2014-07-16 DIAGNOSIS — I480 Paroxysmal atrial fibrillation: Secondary | ICD-10-CM | POA: Diagnosis not present

## 2014-07-16 DIAGNOSIS — I10 Essential (primary) hypertension: Secondary | ICD-10-CM | POA: Diagnosis not present

## 2014-07-16 DIAGNOSIS — I429 Cardiomyopathy, unspecified: Secondary | ICD-10-CM | POA: Diagnosis not present

## 2014-08-07 DIAGNOSIS — E876 Hypokalemia: Secondary | ICD-10-CM | POA: Diagnosis not present

## 2014-08-07 DIAGNOSIS — J449 Chronic obstructive pulmonary disease, unspecified: Secondary | ICD-10-CM | POA: Diagnosis not present

## 2014-08-07 DIAGNOSIS — I10 Essential (primary) hypertension: Secondary | ICD-10-CM | POA: Diagnosis not present

## 2014-08-07 DIAGNOSIS — I4891 Unspecified atrial fibrillation: Secondary | ICD-10-CM | POA: Diagnosis not present

## 2014-08-07 LAB — BASIC METABOLIC PANEL
BUN: 10 mg/dL (ref 4–21)
CREATININE: 0.6 mg/dL (ref 0.5–1.1)
Glucose: 88 mg/dL
POTASSIUM: 4 mmol/L (ref 3.4–5.3)
Sodium: 140 mmol/L (ref 137–147)

## 2014-08-07 LAB — CBC AND DIFFERENTIAL
HEMATOCRIT: 37 % (ref 36–46)
Hemoglobin: 12.2 g/dL (ref 12.0–16.0)
PLATELETS: 152 10*3/uL (ref 150–399)
WBC: 6.7 10^3/mL

## 2014-08-07 LAB — HEPATIC FUNCTION PANEL
ALT: 5 U/L — AB (ref 7–35)
AST: 13 U/L (ref 13–35)

## 2014-08-07 LAB — LIPID PANEL
CHOLESTEROL: 192 mg/dL (ref 0–200)
HDL: 43 mg/dL (ref 35–70)
LDL Cholesterol: 108 mg/dL
Triglycerides: 207 mg/dL — AB (ref 40–160)

## 2014-08-07 LAB — TSH: TSH: 2.92 u[IU]/mL (ref 0.41–5.90)

## 2014-09-10 NOTE — Discharge Summary (Signed)
PATIENT NAME:  Angelica, Strickland MR#:  [id number] DATE OF BIRTH:  May 13, 1956  DATE OF ADMISSION:  03/25/2012 DATE OF DISCHARGE:  03/26/2012  PRIMARY CARE PHYSICIAN:  Dr. Venia Minks CARDIOLOGIST: Dr. Ubaldo Glassing   FINAL DIAGNOSES:  1. Elevated troponin. This is not a myocardial infarction.  2. Cardiomyopathy with no signs of congestive heart failure on this admission.  3. Atrial fibrillation.  4. Alcohol abuse.  5. Tobacco abuse.  6. Hypomagnesemia.   MEDICATIONS ON DISCHARGE:  1. Aldactone 25 mg daily.  2. Aspirin 325 mg daily.  3. Lanoxin 250 mcg daily.  4. Lasix 20 mg, 1/2 tablet twice a day.  5. Lisinopril 5 mg daily.  6. Coreg 6.25 mg, 1/2 tablet twice a day. 7. K-Dur 20 mEq daily.  8. Magnesium oxide 400 mg daily.  9. Nicotine patch,14 mg/24 hour transdermal film, one patch daily.   DIET: Low sodium diet, regular consistency.   ACTIVITY: Activity as tolerated.   FOLLOWUP: 1. Follow up with Dr. Ubaldo Glassing in 3 to 4 weeks. 2. Follow up with Dr. Venia Minks in 1 to 2 weeks.  REASON FOR ADMISSION: The patient was admitted as an observation 03/25/2012 and discharged 03/26/2012. The patient came in with a fall.   HISTORY OF PRESENT ILLNESS: 59 year old female with history of cardiomyopathy, hypertension, alcohol abuse, tobacco abuse, and osteoarthritis presented with a fall. She had 4 to 5 beers, 2 drinks of vodka with Ruby Red cranberry juice.  She had a fall after a feeling of weakness in her knees. She had an elevated alcohol level and an elevated troponin at 0.07. Because of the elevated troponin, hospitalist services were contacted for evaluation. The patient had no chest pain or shortness of breath. The patient was admitted as an observation and had no complaints the entire hospital stay.   LABORATORY AND RADIOLOGICAL DATA: TSH 1.32. Digoxin 1.22. Urine toxicology negative. Urinalysis negative, troponin 0.07, lipase 109. White blood cell count 6.1, hemoglobin and hematocrit 15.7 and 45.6, platelet  count 167. Ethanol level 287, glucose 88, BUN 5, creatinine 0.59, sodium 141, potassium 4.2, chloride 107, CO2 24, calcium 8.7. Liver function tests normal range. Chest x-ray:  Stable cardiomegaly. Troponin 0.08. Next troponin 0.08.   Echocardiogram showed an ejection fraction of 35% to 45%. No thrombus. Left ventricular systolic function moderately reduced. Global hypokinesis of the left ventricle.   LDL 73, HDL 58, triglycerides 149. Magnesium upon discharge 1.6, platelets on discharge 136, hemoglobin 14.5. White count 4.5, glucose 90, BUN 9, creatinine 0.63, sodium 143, potassium 3.6, chloride 109, CO2 26.  HOSPITAL COURSE PER PROBLEM LIST:  1. Elevated troponin: This was watched serially. This is not a myocardial infarction. The patient does not have any signs of a heart attack. No chest pain or shortness of breath. Unclear why a troponin was drawn in the first place in the Emergency Room. The patient was watched on telemetry and discharged home in stable condition. Elevated troponin was most likely secondary to the patient's cardiomyopathy.  2. Alcoholic cardiomyopathy: There were no signs of congestive heart failure on this admission. Again, I repeat that. No signs of congestive heart failure on this admission. She was kept on her usual cardiac medications- aspirin, Coreg, lisinopril, and Lasix.  3. Atrial fibrillation: Her rate was controlled with Lanoxin and Coreg. Since the patient does drink alcohol we will not anticoagulate with Coumadin, Xarelto, or Pradaxa. Too high of a risk. Aspirin only for anticoagulation.  4. Alcohol abuse: She was put on CIWA protocol initially. No signs  of tremor during the hospitalization. Advised to stop drinking at all costs. This could be the problem with her balance and falls.  5. Tobacco abuse: Smoking cessation counseling done three minutes by me during the hospital course. Given a nicotine patch.  6. Hypomagnesemia: This was replaced IV prior to discharge and  orally upon discharge.  TIME SPENT ON DISCHARGE: 35 minutes.    ____________________________ Tana Conch. Leslye Peer, MD rjw:bjt D: 03/26/2012 13:39:53 ET T: 03/27/2012 10:55:09 ET JOB#: [id number]  cc: Tana Conch. Leslye Peer, MD, <Dictator> Javier Docker. Ubaldo Glassing, MD Jerrell Belfast, MD Marisue Brooklyn MD ELECTRONICALLY SIGNED 04/01/2012 17:51

## 2014-09-10 NOTE — H&P (Signed)
Past Med/Surgical Hx:  Cardiomyopathy:   Palpitations:   Thrombocytopenia:   congestive heart failure:   arthritis:   Atrial Fibrillation:   hypertension:   Surgery for Pituitary Tumor:   fibroid tumor removed from uterus:   ALLERGIES:  Clonidine: Rash  Coumadin: Bleeding  Alpha 2 Adrenergic Agonists: Unknown    Assessment/Admission Diagnosis 59 yo female with PMHx of HTN, EtOH abuse, OA, Afib, had accidental fall, now with elevated troponin  Elevated troponin - patient with no chest pain, sob, or significant doe - mild elevation 0.07, given her hx of continued EtOH abuse, I suspect that she has trop leak and this maybe her new basline - trend CEs - cont with beta blocker, asa and bp control - consult cards, she does have an appt on 11/19 for pacemaker evaluation (given EF <25%)  EtOh abuse - elevated etoh level - MVI bag, thiamine, CIWA  - counseled pateint on etoh cessation  Cardiomyopathy - non ischemic, most likely alcoholic (given significant history) - last ECHO 08/2010 with moderate LA\RA dilation, LV dilation, Mod MR, and EF <25% - patient currently being evaluated for ICD placement  HTN - cont with lasix, coreg, aldactone - follow up lytes  Tobacco abuse - patient counseled on tobacco cessation  FULL CODE PMD - Dr. Venia Minks  Time spent evaluating patient = 45 minutes [id number]   Electronic Signatures: Vilinda Boehringer (MD)  (Signed [phone] 06:15)  Authored: PAST MEDICAL/SURGIAL HISTORY, ALLERGIES, HOME MEDICATIONS, ASSESSMENT AND PLAN   Last Updated: 02-Nov-13 06:15 by Vilinda Boehringer (MD)

## 2014-09-10 NOTE — H&P (Signed)
PATIENT NAME:  Angelica Strickland, Angelica Strickland MR#:  [id number] DATE OF BIRTH:  February 05, 1956  DATE OF ADMISSION:  03/25/2012  ED REFERRING PHYSICIAN: Dr. Conni Slipper.  PRIMARY CARE PHYSICIAN: Dr. Venia Minks.  PRIMARY CARDIOLOGIST: Dr. Ubaldo Glassing.  CHIEF COMPLAINT: I fell.   INDICATION FOR ADMISSION: Accidental fall with elevated troponins.   HISTORY OF PRESENT ILLNESS:  This is a 59 year old African American female with past medical history significant for cardiomyopathy, ejection fraction less than 25%, hypertension, alcohol abuse, tobacco abuse, and osteoarthritis that has presented with the above chief complaint. The patient stated earlier tonight she had about 4 to 5 beers, two drinks of vodka and Ruby Red cranberry juice. She was around her house walking around and she just kind of felt weak in her knees and had an accidental controlled fall to the ground associated with some lightheadedness. At that point she decided to come to the ED for further work-up and evaluation. In the ED she had alcohol level checked that was 0.287. Her troponin was noted to be at 0.07, elevated from 0.04. She did not have any imaging studies done, but because of the elevated troponin and low ejection fraction, hospitalist services were consulted for further inpatient work-up and management.   PAST MEDICAL HISTORY:  1. Nonischemic cardiomyopathy with ejection fraction 25%. The patient was admitted April of last year for acute systolic congestive heart failure, nonsustained ventricular tachycardia, demand ischemia. She had a cardiac catheterization done that did not show any evidence of coronary artery disease on 09/18/2010. Her echocardiogram showed ejection fraction of less than 25%, moderate mitral regurgitation, right atrium moderately dilated, left atrium moderately dilated and mild to moderate tricuspid regurgitation with mild to moderate LV dilation.  2. History of pituitary tumor status post resection.  3. History of uterine  fibroids. 4. Osteoarthritis.  PAST SURGICAL HISTORY: As above.   ALLERGIES: Clonidine.   HOME MEDICATIONS:  1. Aldactone 25 mg, one tab daily. 2. Aspirin 325, one tab daily. 3. Coreg 6.25, 0.5 tablet b.i.d.  4. K-Dur 1 tab once a day. 5. Lanoxin 250 mcg once a day in the morning. 6. Lasix 20 mg, 1/2 tablet twice a day. 7. Lisinopril 5 mg once a day in the morning.   FAMILY HISTORY: Mother died with Alzheimer's dementia. Father had diabetes.  SOCIAL HISTORY: She lives in [place] with a friend. Currently still smoking tobacco about 1/2 pack a day since she was a teenager. She is also still drinking 4 to 5 beers per day since she was a teenager. She does have a history of alcoholism. She denies any other drugs, such as heroin, meth or cocaine. She is currently not employed as she is on disability. She used to work as a Regulatory affairs officer and developed carpal tunnel syndrome.   REVIEW OF SYSTEMS: No fever or chills. She reports just some mild weakness in her knees tonight. EYES: No inflammation, glaucoma or cataracts. ENT: No ear pain, hearing loss, or epistaxis. RESPIRATORY: Chronic cough and intermittent wheezing, nothing changed from baseline. No hemoptysis or shortness of breath, some mild dyspnea on exertion after moderate amount of walking. CARDIOVASCULAR: No chest pain, orthopnea, or edema. She endorses intermittent palpitations at times. GASTROINTESTINAL: No nausea, vomiting or diarrhea. No abdominal pain. GU: No dysuria or hematuria. ENDOCRINE: No heat or cold intolerance. HEMATOLOGIC: No easy bleeding, bruising or swollen glands. SKIN: No rash or ulcers. MUSCULOSKELETAL: She has occasional back pain and joint pain. Currently, she has some weakness in her knees. NEUROLOGICAL: No weakness or numbness. No  history of strokes or seizures. PSYCH: Denies any suicidal ideation, but does endorse some depression since the loss of her job.   PHYSICAL EXAMINATION:    VITAL SIGNS IN THE ED: Temperature  98.1, pulse 78, respirations 17, blood pressure 104/68.   GENERAL: No acute distress, sitting up in bed, giving appropriate history.   HEENT: Normocephalic, atraumatic. Pupils equal, round, and symmetric to light. Nares are without discharge. Oropharynx is without erythema or exudate. She has moist mucous membranes.   NECK: Soft and supple. No adenopathy. No JVD.   CARDIOVASCULAR: S1 and S2 auscultated. No murmurs. Irregularly, irregular. No rubs, gallops or clicks.   LUNGS: No use of accessory muscles. No increased respiratory effort. No crackles or wheezing on exam.   ABDOMEN: Soft, nontender, nondistended. Positive bowel sounds. No masses appreciated.   EXTREMITIES:  Some trace pitting edema up to the ankles. Otherwise, pulses are intact.   MUSCULOSKELETAL: No joint effusion. Good range of motion.   SKIN: No rash, ulcers or nodules.   NEUROLOGIC: No dysarthria or aphasia. Symmetrical strength. Cranial nerves II to XII intact.   PSYCH: She is alert and oriented. The patient is cooperative.  LABORATORY, RADIOLOGICAL AND DIAGNOSTIC DATA: Sodium 141 potassium 4.2, chloride 107, bicarbonate 24, BUN 5, creatinine 0.59, troponin 0.07, AST 29, ALT 16. Digoxin level 1.22. Negative urinalysis. White cell count 6.1, hemoglobin 15.7, hematocrit 45.6, platelet count 167, MCV 110. EKG showed normal sinus rhythm, rate of about 80. No acute ST-T wave changes.   ASSESSMENT AND PLAN: 59 year old female with past medical history of hypertension, alcohol abuse, osteoarthritis, atrial fibrillation, accidental fall, tobacco abuse, now an elevated troponin.  1. Elevated troponin. The patient is with no chest pain or significant dyspnea on exertion. She does not endorse any chest tightness or pressure at this time. At this time she does have a mild elevation of 0.07. Given her history of continued alcohol abuse I suspect she does have some troponin leak and this may be her new baseline. We will continue to  trend her cardiac enzymes. Continue beta blocker, aspirin, and blood pressure control. Will consult cardiology. She does have an appointment on 11/19 for pacemaker evaluation given that she has ejection fraction of 25% with Dr. Ubaldo Glassing.  2. Alcohol abuse, elevated alcohol level at 0.287. We will start her on a multivitamin bag, thiamin, CIWA protocol. Counsel the patient on alcohol cessation also.  3. Cardiomyopathy, nonischemic, most likely alcoholic cardiomyopathy given significant history of alcoholic abuse. Last echo on April 2012 showed moderate LA/RA dilation, LV dilation, moderate MR and ejection fraction of less than 25%. The patient currently is being evaluated for implantable cardiac defibrillator placement.  4. Hypertension. Continue with Lasix, Coreg, Aldactone. Follow up on lytes, monitor blood pressure closely at this time.  5. Atrial fibrillation. She is currently on Coreg and digoxin. Her level is at 1.22. She is rate controlled right now also. Given her alcoholic abuse and elevated alcohol level, will hold her current dose of digoxin and restart this afternoon.   The patient is a FULL CODE.   TIME SPENT DICTATING AND EVALUATING THE PATIENT: 45 minutes.   ____________________________ Vilinda Boehringer, MD vm:ap D: 03/25/2012 06:15:00 ET T: 03/25/2012 10:39:50 ET JOB#: [id number]  cc: Vilinda Boehringer, MD, <Dictator> Jerrell Belfast, MD Vilinda Boehringer MD ELECTRONICALLY SIGNED 03/25/2012 13:09

## 2014-09-10 NOTE — Consult Note (Signed)
PATIENT NAME:  Angelica Strickland, Angelica Strickland MR#:  [id number] DATE OF BIRTH:  1955-09-12  DATE OF CONSULTATION:  03/25/2012  REFERRING PHYSICIAN:  Margarita Rana, MD CONSULTING PHYSICIAN:  Dwayne D. Clayborn Bigness, MD  PRIMARY CARDIOLOGIST: Bartholome Bill, MD  INDICATION: Elevated troponins, possible non-Q-wave myocardial infarction.   HISTORY OF PRESENT ILLNESS: Angelica Strickland is a 59 year old African American female with past history of cardiomyopathy with ejection fraction of 25%, hypertension, alcohol abuse, tobacco abuse, and osteoarthritis. She had been drinking beer and vodka. She was walking around her house and felt weak in her knees and fell. She was lightheaded. She was brought to the Emergency Room and found to have an elevated alcohol level of 0.29. Troponins were elevated at 0.07. Because of her depressed LV function, cardiomyopathy, and elevated troponin cardiology consultation was recommended.   REVIEW OF SYSTEMS: She has had occasional weakness, fatigue, and blackout spells. Occasional syncope. Alcohol abuse. No chest pain. No weight loss or weight gain. No hemoptysis or hematemesis. No bright red blood per rectum.   PAST MEDICAL HISTORY:  1. Nonischemic cardiomyopathy.  2. Pituitary tumor resection. 3. Uterine fibroids. 4. Alcohol abuse. 5. Osteoarthritis.   PAST SURGICAL HISTORY:  1. AICD placement. 2. Pituitary tumor resection.   ALLERGIES: Clonidine.     MEDICATIONS: 1. Aldactone 25 mg a day.  2. Aspirin 325 mg a day.  3. Coreg 6.25 mg half tablet twice a day.  4. K-Dur one daily. 5. Lanoxin 0.25 mg once a day.  6. Lasix 20 mg one half twice a day.  7. Lisinopril 5 mg a day.   FAMILY HISTORY: Alzheimer's and diabetes.   SOCIAL HISTORY: Lives in [place]. Still smoking. Still actively abusing alcohol. Denies any drug use. Unemployed and on disability.   PHYSICAL EXAMINATION:   VITAL SIGNS: Blood pressure 105/70, pulse 75, respiratory rate 16, and afebrile.   HEENT:  Normocephalic, atraumatic. Pupils are equally round and reactive to light.   NECK: Supple. No significant JVD, bruits, or adenopathy.   PULMONARY: Clear to auscultation and percussion. No significant wheezing, rhonchi, or rales.   CARDIOVASCULAR: Positive S3. Positive S4. Systolic ejection murmur at left sternal border. PMI nondisplaced.   ABDOMEN: Examination is benign.   EXTREMITIES: Examination is within normal limits.   NEURO: Examination is intact.  SKIN: Examination is normal.  LABORATORY/DIAGNOSTIC: Sodium 141, potassium 4.2, chloride 107, bicarbonate 24, BUN 5, and creatinine 0.59. LFTs negative. Digoxin 1.22.  Urinalysis negative.   White count 6.1, hemoglobin 15.7, hematocrit 45.6, and platelet count 167.   EKG: Normal sinus rhythm, rate of 80, nonspecific findings.   ASSESSMENT:  1. Cardiomyopathy. 2. History of congestive heart failure.  3. Alcohol abuse. 4. Falls.  5. Possible near syncope. 6. Elevated troponin. 7. History of atrial fibrillation.  8. Hypertension.   PLAN: Agree with admit. Rule out for myocardial infarction. Place on telemetry. Follow up cardiac enzymes. Agree with followup of heart function. We will continue current therapy for cardiomyopathy and hypertension. I do not recommend invasive cardiac evaluation. I suspect most of her symptoms are related to alcoholism and that should be addressed with CIWA protocol and possibly treatment. Advised the patient to quit smoking. Continue blood pressure control. She should be evaluated for defibrillator as well because of the cardiomyopathy. Atrial fibrillation should be continued be controlled with Coreg and digoxin. Do not recommend long-term anticoagulation because of her fall risk. ____________________________ Loran Senters. Clayborn Bigness, MD ddc:slb D: 03/26/2012 07:53:00 ET T: 03/27/2012 08:38:27 ET JOB#: [id number]  cc: Dwayne D.  Clayborn Bigness, MD, <Dictator> Yolonda Kida MD ELECTRONICALLY SIGNED 05/04/2012  11:21

## 2014-09-14 NOTE — Consult Note (Signed)
Brief Consult Note: Diagnosis: CHF.   Patient was seen by consultant.   Consult note dictated.   Recommend further assessment or treatment.   Comments: No evidence of acute chole, pt has never had abd pain. Has gallstones and thickened GB wall likely sec to anasarca. Also is thrombocytopenic. No surgical plans, will follow.  Electronic Signatures: Florene Glen (MD)  (Signed 06-May-15 20:47)  Authored: Brief Consult Note   Last Updated: 06-May-15 20:47 by Florene Glen (MD)

## 2014-09-14 NOTE — Consult Note (Signed)
   Comments   I called and spoke with pt's sister. She seems to understand that significance of pt's health issues. Sister says she thinks patient is drinking herself to death and understands that her health problems could be life limiting. We talked about hospice involvement once patient returns home. She would be interested in helping to take care of her sister but feels that pt will first need to go to rehab and be ambulatory.  requests that she meet with [place] on Friday and will plan on meeting at [place]: Borders, Kirt Boys (NP)  (Signed 20-May-15 16:01)  Authored: Palliative Care Phifer, Izora Gala (MD)  (Signed 20-May-15 16:49)  Authored: Palliative Care   Last Updated: 20-May-15 16:49 by Phifer, Izora Gala (MD)

## 2014-09-14 NOTE — Consult Note (Signed)
PATIENT NAME:  Angelica Strickland, PUTNAM MR#:  [id number] DATE OF BIRTH:  [birth year]  DATE OF CONSULTATION:  09/28/2013  REFERRING PHYSICIAN:  Epifanio Lesches, MD CONSULTING PHYSICIAN:  Hiram Mciver Lilian Kapur, MD  REASON FOR CONSULTATION: Acute renal failure in the setting of hyponatremia and severe congestive heart failure.   HISTORY OF PRESENT ILLNESS: The patient is a 59 year old African American female with past medical history of nonischemic cardiomyopathy, chronic systolic heart failure with ejection fraction less than 20%, thrombocytopenia, paroxysmal atrial fibrillation, hypertension, and history of pituitary tumor who presented to [facility] with weakness, shortness of breath and increasing lower extremity edema. The patient states that over the past several weeks she has been having increasing shortness of breath, orthopnea and increasing lower extremity edema. She had a 2-D echocardiogram performed this admission which showed a very low ejection fraction of less than 20%. We are asked to see her for the evaluation and management of acute renal failure and hyponatremia. Upon presentation, serum sodium was quite low at 119. With diuresis serum sodium has come up to 124. Serum potassium is normal at 3.7. Creatinine is currently 1.54. The patient has a normal baseline creatinine of 0.53 from October of 2014. She reports that it has been some time since she has had physician follow-up. She has improving LFTs at present. She also is noted as having chronic thrombocytopenia and platelet count is currently 63,000. Urinalysis shows urine protein of 100 mg/dL, 1 RBC per high-power field, and 4 WBCs per high-power field.   PAST MEDICAL HISTORY: 1.  Nonischemic cardiomyopathy.  2.  Thrombocytopenia.  3.  Chronic systolic heart failure with ejection fraction less than 20%.  4.  Paroxysmal atrial fibrillation.  5.  Hypertension.  6.  Pituitary tumor status post surgical intervention.  7.   Fibroid tumor of the uterus.   ALLERGIES: ALPHA-2 ADRENERGIC AGONIST, CLONIDINE, COUMADIN.  CURRENT INPATIENT MEDICATIONS: Include:  1.  Acetaminophen 650 mg p.o. q. 4 hours p.r.n.  2.  Lasix 40 mg IV q. 12 hours. 3.  Lisinopril 5 mg p.o. daily.  4.  Magnesium oxide 400 mg p.o. daily. 5.  Zofran 4 mg IV q. 4 hours p.r.n.  6.  Pantoprazole 40 mg p.o. daily.  7.  Senna 1 tablet p.o. b.i.d.  8.  Coreg 3.125 mg p.o. b.i.d.   FAMILY HISTORY: Significant for diabetes mellitus and Alzheimer's dementia.   SOCIAL HISTORY: The patient lives in [place]. She reports that her husband is currently in a nursing home. She smokes at least 1/2 pack of cigarettes a day sometimes a whole pack. She also has a significant history of alcohol abuse and drinks 4 to 5 beers per day. She used to work as a Regulatory affairs officer in the past.   REVIEW OF SYSTEMS: CONSTITUTIONAL: The patient denies fevers, chills, or weight loss.  EYES: Denies diplopia or blurry vision.  HEENT: Denies headaches or hearing loss. Denies epistaxis.  CARDIOVASCULAR: Denies chest pain. Has history of known congestive heart failure and has increasing lower extremity edema as well as orthopnea.  RESPIRATORY: Denies cough or hemoptysis, but does endorse shortness of breath particularly with exertion.  GASTROINTESTINAL: Denies nausea, vomiting or bloody stools.  GENITOURINARY: Denies frequency, urgency, or dysuria.  MUSCULOSKELETAL: Denies joint pain, swelling or redness.  INTEGUMENTARY: Denies skin rashes or lesions.  NEUROLOGIC: Denies focal extremity numbness, weakness or tingling.  PSYCHIATRIC: Denies depression, bipolar disorder. Has history of alcoholism.  ENDOCRINE: Denies polyuria, polydipsia.  HEMATOLOGIC AND LYMPHATIC: Denies easy bruisability, bleeding, or  swollen lymph nodes.  ALLERGY AND IMMUNOLOGIC: Denies seasonal allergies or history of immunodeficiency.   PHYSICAL EXAMINATION: VITAL SIGNS: Temperature 98.2, pulse 64,  respirations 16, blood pressure 90/74, pulse ox 96%.  GENERAL: Well-developed, well-nourished African American female who appears her stated age, currently in no acute distress.  HEENT: Normocephalic, atraumatic. Extraocular movements are intact. There was some pupillary asymmetry with the left pupil being larger than the right but both were found to be reactive. There was slight scleral icterus noted. Conjunctivae are pink. No epistaxis noted. Gross hearing intact. Oral mucosa moist.  NECK: Supple and without lymphadenopathy.  LUNGS: Demonstrate bibasilar rales with normal respiratory effort.  HEART: S1 and S2, 2/6 systolic ejection murmur heard.  ABDOMEN: Soft, nontender, nondistended. Bowel sounds positive. No rebound or guarding. No gross organomegaly appreciated.  EXTREMITIES: 3+ pitting edema noted in bilateral lower extremities.  NEUROLOGIC: The patient is awake, alert, and oriented to time, person, and place. Strength is 5/5 in both upper and lower extremities.  GENITOURINARY: No suprapubic tenderness noted at this time.  PSYCHIATRIC: The patient with appropriate affect and appears to have some insight into her current illness.  SKIN: Warm and dry. No rashes noted.  MUSCULOSKELETAL: No joint redness, swelling or tenderness appreciated   DIAGNOSTIC DATA: Sodium 124, potassium 3.7, chloride 92, CO2 24, BUN 23, creatinine 1.5, glucose 107. LFTs shows total protein 6.2, albumin 3, total bilirubin 2.9, alkaline phosphatase 95, AST 149, ALT 160. TSH 1.73. CBC shows WBC 3.3, hemoglobin 12.6, hematocrit 37, platelets 63,000. INR 1.7. Urinalysis shows urine protein 100 mg/dL, 1 RBC per high-power field, 4 WBCs per high-power field.  A 2-D echocardiogram reveals ejection fraction less than 20%, severely decreased global left ventricular systolic function with moderately increased left ventricular internal cavity size.   IMPRESSION: This is a 59 year old African American female with past medical  history of nonischemic cardiomyopathy, thrombocytopenia, chronic systolic heart failure with ejection fraction less than 20%, paroxysmal atrial fibrillation, and hypertension who presented to [facility] with shortness of breath, orthopnea and found to have acute systolic heart failure exacerbation.  1.  Acute renal failure, likely due to cardiorenal syndrome with contributions from contrast.  2.  Hyponatremia.  3.  Acute systolic heart failure exacerbation.  4.  Alcohol abuse.   PLAN: The patient presents primarily with an acute systolic heart failure exacerbation, which is likely due to nonadherence with medications and ongoing alcohol abuse. The patient was counseled extensively on the dangers of continued alcohol abuse. In regards to her renal failure, this is multifactorial as she was exposed to contrast and likely is also suffering from a cardiorenal syndrome. I agree with administering Lasix 40 mg IV every 12 hours for now. We could potentially consider conivaptan; however, we will hold off for now as the sodium has come up with diuresis. We recommend ongoing monitoring of renal function and serum sodium over the next several days. We will proceed with further work-up including SPEP, UPEP and ANA. No need to obtain renal ultrasound as CT scan of the abdomen and pelvis was performed and there is no sign of hydronephrosis based upon that study. I would like to thank Dr. Vianne Bulls for this kind referral. Further plan as the patient progresses.  ____________________________ Tama High, MD mnl:sb D: 09/28/2013 15:44:37 ET T: 09/28/2013 16:19:24 ET JOB#: [id number]  cc: Tama High, MD, <Dictator> Mariah Milling Raisha Brabender MD ELECTRONICALLY SIGNED 10/11/2013 9:50

## 2014-09-14 NOTE — Discharge Summary (Signed)
PATIENT NAME:  Angelica Strickland, FIRESTINE MR#:  [id number] DATE OF BIRTH:  [birth year]  DATE OF ADMISSION:  09/25/2013 DATE OF DISCHARGE:  10/12/2013  DISCHARGE DIAGNOSES: 1. 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 She was diuresed very well on Lasix drip.  2.  Severe hyponatremia due to heart failure and cardiorenal syndrome, now resolved.  3.  Acute renal failure, likely prerenal etiology, now resolved.  4. Atrial fibrillation with rapid ventricular response, rate well controlled. She is auto-anticoagulated due to cirrhosis of liver with elevated INR of 1.7. She will be on full dose aspirin. SHE IS ALLERGIC TO COUMADIN, so would not need to start any other anticoagulation at this time.  5. Vaginal bleeding, postmenopausal, with pelvic ultrasound showing fibroids. Will need outpatient OB/GYN followup.  6.  Generalized weakness, likely multifactorial, needs rehab.  7.  Mild urinary tract infection, on antibiotics.  8.  Diaper dermatitis on zinc ointment.     SECONDARY DIAGNOSES: 1.  Nonischemic cardiomyopathy.  2.  Thrombocytopenia.  3.  Paroxysmal atrial fibrillation.  4.  Hypertension.   CONSULTATIONS:  1.  Cardiology, Dr. Bartholome Bill.  2.  Physical therapy.  3.  Surgery, Dr. Phoebe Perch.  4.  Nephrology, Dr. Candiss Norse.  5.  Palliative care, Dr. Ermalinda Memos.   PROCEDURES AND RADIOLOGY:  Please refer to interim discharge summary dictated on 12th of May by Dr. Tressia Miners. Please note there were no new procedures or radiology obtained after the last interim discharge summary on the 12th of May by Dr. Tressia Miners.   HISTORY AND SHORT HOSPITAL COURSE: The patient is a 59 year old female with the above-mentioned medical problems who was admitted for acute on chronic systolic heart failure with severe cardiomyopathy with EF of 20%. She also had severe hyponatremia with sodium of 119 on admission. Please see Dr. Trena Platt dictated history and physical for  further details. Please note Dr. Wyatt Portela interim discharge summary on 12th of May for detailed course of admission until 12th of May. The patient was started on Lasix and was continued on Lasix drip with close monitoring of sodium and fluid status by nephrology. She was diuresed very well and her sodium had normalized by the 22nd of May with a value of 137. Her creatinine was back to normal. She was feeling significantly better, although she remained weak. Was followed by physical therapy and was recommended rehab placement, for which she was not quite agreeable, but after a long discussion with family and palliative care, she was in agreement to go to rehab with palliative care following there.  She will be discharged today in fair condition to rehab with palliative care following there with the hope that if she does end up going home and continues to be weak and worse, she may be followed by hospice at home.   PERTINENT PHYSICAL EXAMINATION ON THE DATE OF DISCHARGE:  VITAL SIGNS:  Temperature 98.6, heart rate 101, pulmonary respirations 20 per minute, blood pressure 106/75 mmHg.  She is saturating 98% on room air.  CARDIOVASCULAR: S1, S2 normal. No murmurs, rubs or gallop.  LUNGS: Clear to auscultation bilaterally. No wheezing, rales, rhonchi or crepitation.  ABDOMEN: Soft, benign.  NEUROLOGIC: Nonfocal examination. All other physical examination remained at baseline.   DISCHARGE MEDICATIONS: 1.  Aldactone 25 mg p.o. daily.  2.  Aspirin 325 mg p.o. daily.  3.  Lanoxin 250 mcg p.o. daily.  4.  Lisinopril 2.5 mg p.o. daily.  5.  Coreg 3.125 mg p.o. b.i.d.  6.  Vitamin A and D topical to affected area twice a day for 7 more days.  7.  Levaquin 250 mg p.o. daily for 3 more days.  8.  Torsemide 20 mg 2 tablets p.o. daily.   DISCHARGE DIET:  Renal diet.   DISCHARGE ACTIVITY:  As tolerated.   DISCHARGE INSTRUCTIONS AND FOLLOWUP: The patient was instructed to follow up with her primary care  physician, Dr. Margarita Rana, in 1 to 2 weeks. She will need followup with Dr. Murlean Iba from nephrology in 2 to 4 weeks, with [facility] cardiology, Dr. Bartholome Bill, in 4 to 6 weeks, and OB/GYN doctors in [place] in 2 to 4 weeks for her uterine fibroids and vaginal bleed. Please note, her vaginal bleed has stopped. She is hemodynamically stable. She was instructed to get palliative care to follow her at [facility] where she is being discharged, and if she continues to get worse, consider involving hospice services. May be home with hospice from there.   She remains at very high risk for readmission and worsening clinical condition. Please note, her code status is DO NOT RESUSCITATE, and this was confirmed with the patient and her sister, who are in agreement.   TOTAL TIME DISCHARGING THIS PATIENT:  55 minutes.    ____________________________ Lucina Mellow. Manuella Ghazi, MD vss:dmm D: 10/12/2013 11:36:14 ET T: 10/12/2013 11:59:08 ET JOB#: [id number]  cc: Dalila Arca S. Manuella Ghazi, MD, <Dictator> Jerrell Belfast, MD Javier Docker. Ubaldo Glassing, MD Tama High, MD Jerrol Banana. Burt Knack, MD Efraim Kaufmann, MD Zimmerman MD ELECTRONICALLY SIGNED 10/16/2013 11:47

## 2014-09-14 NOTE — Consult Note (Signed)
   Present Illness patient is a 59 year old female with history with an ejection fraction of approximately 25%. She had been treated with lisinopril, furosemide, Lanoxin, carvedilol and Aldactone. She has beennnoncompliant with her medications for at least 6 months. She cannot remember the last time she took them. She denies chest pain. She is a mild serum troponin elevation. This is likely secondary to demand ischemia. She states she is has shortness of breath her sometime in this regard to the point where she no longer stay at home and presented to the hospital.   Physical Exam:  GEN obese   HEENT PERRL, hearing intact to voice   NECK supple   RESP no use of accessory muscles  rhonchi  crackles   CARD Normal, S1, S2  Murmur   Murmur Systolic   Systolic Murmur axilla   ABD denies tenderness  no hernia  normal BS   LYMPH negative neck, negative axillae   EXTR negative cyanosis/clubbing, positive edema   SKIN normal to palpation   NEURO cranial nerves intact, motor/sensory function intact   PSYCH A+O to time, place, person   Review of Systems:  Subjective/Chief Complaint shortness of breath and swelling   General: Fatigue  Weakness   Skin: No Complaints   ENT: No Complaints   Eyes: No Complaints   Respiratory: Short of breath  Wheezing   Cardiovascular: Dyspnea  Edema   Gastrointestinal: No Complaints   Genitourinary: No Complaints   Vascular: No Complaints   Musculoskeletal: No Complaints   Neurologic: No Complaints   Hematologic: No Complaints   Endocrine: No Complaints   Psychiatric: No Complaints   Review of Systems: All other systems were reviewed and found to be negative   Medications/Allergies Reviewed Medications/Allergies reviewed   Family & Social History:  Family and Social History:  Family History Non-Contributory   Social History negative tobacco, negative Illicit drugs   Place of Living Home    Clonidine: Rash  Coumadin:  Bleeding  Alpha 2 Adrenergic Agonists: Unknown   Impression 59-year-old female withhas a cardiomyopathy was admitted with this acute on chronic systolic heart failure secondary to medication noncompliance. Patient has taken none of her medications for the last 6 months. She has noted weight gain peripheral edema and shortness of breath. Chest x-ray revealed pulmonary edema. She is a mild serum troponin elevation which appears to be secondary to demand ischemia she had had unremarkable coronary disease by cardiac catheterization in the past. Will resume her home medications Vanceril importance of compliance and placed on low-sodium diet.   Plan 1. Will resume Aldactone 25 mg daily, carvedilol 12.5 mg twice daily, Lanoxin, Lasix and lisinopril 2. Importance of medication compliance with stress 3. We not proceed left cardiac catheterization  given the patient's history of idiopathic current myopathy and most likely demand ischemia 4. Will closely follow her acute on chronic systolic heart failure. She has New York Heart Association class IV symptoms.   Electronic Signatures: Teodoro Spray (MD)  (Signed [phone] 15:45)  Authored: General Aspect/Present Illness, History and Physical Exam, Review of System, Family & Social History, Allergies, Impression/Plan   Last Updated: 05-May-15 15:45 by Teodoro Spray (MD)

## 2014-09-14 NOTE — Consult Note (Signed)
PATIENT NAME:  Angelica Strickland, Angelica Strickland MR#:  [id number] DATE OF BIRTH:  02/21/1956  DATE OF CONSULTATION:  09/26/2013  REFERRING PHYSICIAN:   CONSULTING PHYSICIAN:  Jerrol Banana. Burt Knack, MD  CHIEF COMPLAINT:  Congestive heart failure.   HISTORY OF PRESENT ILLNESS:  This patient was admitted to the hospital with a diagnosis of severe CHF with anasarca and hyponatremia and weakness secondary to medical noncompliance. Her workup in the Emergency Room demonstrated elevated liver function tests, and with those findings, an ultrasound and CT scan were obtained, which showed the presence of gallstones and a thickened gallbladder wall.   I was asked to see the patient for possible gallbladder disease in a patient with known gallstones.   The patient denies any abdominal pain. She has not had any abdominal pain today, has not had any abdominal pain in the prior several weeks, and in fact has never had abdominal pain; eats whatever she wants, although she has had a poor appetite recently. She denies nausea, vomiting; no fatty food intolerance; no diarrhea.   PAST MEDICAL HISTORY:  Cardiomyopathy with CHF, thrombocytopenia, atrial fibrillation, hypertension.   PAST SURGICAL HISTORY:  Pituitary tumor excision and fibroid tumor.   ALLERGIES:  CLONIDINE and COUMADIN.   MEDICATIONS:  Multiple; see chart. Note that the patient has been noncompliant with her medications and has not taken them for several weeks.   FAMILY HISTORY:  Noncontributory.   SOCIAL HISTORY:  The patient continues to smoke and drinks alcohol, and is currently unemployed.   REVIEW OF SYSTEMS:  A 10-system review was performed and negative, and is documented in the chart.   PHYSICAL EXAMINATION:  GENERAL:  Morbidly obese female patient with a BMI of 35. Temperature 97.3, pulse of 53, respirations 16, blood pressure 84/60, and 97% room air sat.  HEENT:  No scleral icterus.  NECK:  No palpable neck nodes.  CHEST:  Clear to auscultation with mild  rhonchi bilaterally.  CARDIAC:  Regular rate and rhythm.  ABDOMEN:  Soft, nondistended. Multiple ventral hernias on the midline are noted. There is an infraumbilical paramedian incision present. The right upper quadrant is completely nontender with a negative Murphy's sign.  EXTREMITIES:  Moderate edema.  NEUROLOGIC:  Grossly intact.  INTEGUMENT:  No jaundice.   LABORATORY VALUES:  Elevated transaminases with a normal alkaline phosphatase and an elevated bilirubin of 4.2. Serum sodium is 122, was 119; with a creatinine of 1.5. White blood cell count is 3, H and H of 12 and 37, and a platelet count of 63.   CT scan is personally reviewed, as is ultrasound. Gallstones are present, mild gallbladder wall thickening. There is a positive sonographic Murphy's sign (this is not confirmed with physical exam findings).   ASSESSMENT AND PLAN:  This is a patient, who I was asked to see for gallbladder disease. She has no clinical findings of acute cholecystitis. She does have gallstones, but denies any abdominal pain and is completely nontender in the right upper quadrant. She does not warrant surgical intervention, but can be observed at this point. She is profoundly thrombocytopenic and has anasarca with a low albumin and low sodium level. I would recommend observation in this patient only.     ____________________________ Jerrol Banana Burt Knack, MD rec:ms D: 09/26/2013 20:53:41 ET T: 09/26/2013 23:03:34 ET JOB#: [id number]  cc: Jerrol Banana. Burt Knack, MD, <Dictator> Florene Glen MD ELECTRONICALLY SIGNED 09/26/2013 23:25

## 2014-09-14 NOTE — H&P (Signed)
PATIENT NAME:  Angelica Strickland, Angelica Strickland MR#:  [id number] DATE OF BIRTH:  1956/03/01  DATE OF ADMISSION:  09/25/2013  PRIMARY CARE PHYSICIAN: Margarita Rana, MD  CARDIOLOGIST: Bartholome Bill, MD  REQUESTING PHYSICIAN: Carrie Mew, MD  CHIEF COMPLAINT: Weakness.   HISTORY OF PRESENT ILLNESS: The patient is a 59 year old female with a known history of nonischemic cardiomyopathy status post cardiac catheterization in 2012, has not been taking her medications for several months, at least 4 to 6 months. The last she can recall is anywhere from November or December of last year. She has not seen her doctors either. She has been unable to get up for the last 3 to 4 weeks and having difficulty walking. Her friend who lives across the street was helping her to get around some with her cane. That has also been getting gradually difficult and she cannot hold the cane. She has not been eating or drinking well and has been very hungry. She fell last week and feels like she is going to fall more often because of weakness. While in the ED, she was found to have sodium of 119 and creatinine 1.57. Her LFTs were also increased along with troponin, and she is being admitted for further evaluation and management. She also reports feeling very short of breath along with getting her legs being more and more swollen, and her abdominal ultrasound was performed as she was complaining of minimal abdominal pain, which showed acute cholecystitis. She is being admitted for further evaluation and management.   PAST MEDICAL HISTORY: 1.  Nonischemic cardiomyopathy.  2.  Thrombocytopenia.  3.  CHF. 4.  Paroxysmal atrial fibrillation.  5.  Hypertension.   PAST SURGICAL HISTORY: 1.  Surgery for pituitary tumor.  2.  Fibroid tumor removed from uterus.   ALLERGIES: ALPHA AGONIST, CLONIDINE AND COUMADIN.   HOME MEDICATIONS: She has not taken any medications for at least the last 4 to 6 months, could be longer. She is not a good  historian. Before that she asked to take the following medications: 1.  Aldactone 25 mg p.o. daily.  2.  Aspirin 325 mg p.o. daily.  3.  Coreg 6.25 mg p.o. b.i.d.  4.  Lasix 20 mg 2 tablets p.o. daily. 5.  K-Dur 20 once daily.  6.  Lanoxin 0.25 mg p.o. daily.  7.  Lisinopril 5 mg p.o. daily.  8.  Magnesium oxide 400 mg p.o. daily.   FAMILY HISTORY: Mother died of Alzheimer's dementia. Father had diabetes.   SOCIAL HISTORY: She lives in [place] with her friend who lives nearby. She is a former smoker. I think she still smokes about 1/2 pack a day since her teenage years. Drinks about 4 to 5 beers a day since she was a teenager. She does have a history of alcoholism. No drugs. She is unemployed, is on disability. She used to work as a Regulatory affairs officer and developed carpal tunnel syndrome.   REVIEW OF SYSTEMS: CONSTITUTIONAL: No fever. Positive for fatigue and weakness. No weight loss or weight gain. EYES: No blurred or double vision.  ENT: No tinnitus or ear pain.  RESPIRATORY: No cough, wheezing, hemoptysis.  CARDIOVASCULAR: Positive for dyspnea on exertion, intermittent palpitations. No chest pain. Positive for lower extremity edema.  GENITOURINARY: No dysuria or hematuria.  ENDOCRINE: No polyuria or nocturia.  HEMATOLOGY: No anemia or easy bruising.  SKIN: No rash or lesion.  MUSCULOSKELETAL: Occasional back pain, joint pain, and also pain in the knees. Also limited activity, mainly uses cane but that is  also getting difficult recently.  NEUROLOGIC: No tingling, numbness. Positive for difficulty walking with generalized weakness.  PSYCHIATRY: No history of anxiety. Positive for depression. No suicidal ideation.   PHYSICAL EXAMINATION: VITAL SIGNS: Temperature 97.4, heart rate 122 per minute, respiration 30 per minute, blood pressure 118/81 mmHg and she is saturating 98% on 2 liters oxygen via nasal cannula.  GENERAL: The patient is a 59 year old female lying in the bed in no acute  distress.  EYES: Pupils equal, round, and reactive to light and accommodation. She does seem to have some scleral icterus and/or pallor. NECK: Supple. No jugular venous distention. No thyroid enlargement or tenderness.  LUNGS: Decreased breath sounds at bases. No wheezing, rales, rhonchi, or crepitation.  CARDIOVASCULAR: S1, S2 normal. No murmurs, rubs, or gallops.  ABDOMEN: Soft, minimally tender in the right upper quadrant. Also has some tenderness around the mid umbilical region and I was able to feel some kind of cord in the middle of the abdomen around periumbilical region, something underlying tumor kind of feeling. She does have midline surgical vertical scar which is well healed. No signs of infection.  MUSCULOSKELETAL: No joint effusion. Good range of motion. SKIN: No obvious rash, lesion, ulcer. NEUROLOGIC: Cranial nerves III through XII intact. Muscle strength 4/5 in all extremities. Sensation is intact. Gait not checked.  PSYCHIATRIC: The patient seems alert and oriented x3. She is cooperative but at times feels very tired and does not want to communicate.   DIAGNOSTIC DATA: CBC within normal limits, except platelet count 77. BMP showed sodium 119, potassium 4.4, chloride 88, CO2 19, BUN 25, creatinine 1.57. LFTs showed AST of 463, ALT 289, alkaline phosphorous 118, total bilirubin 5.6. Cardiac enzymes show troponin of 0.08. UA showed trace bacteria, 4 WBCs, otherwise negative.   Chest x-ray in the ED showed moderate to markedly enlarged cardiac silhouette as well as pulmonary vascular congestion.   Abdominal ultrasound showed findings consistent with cholelithiasis and probable acute cholecystitis. No choledocholithiasis. Visualized pancreas was unremarkable.  EKG showed atrial fibrillation with rapid ventricular response, rate of 132 per minute. No major ST-T changes.   IMPRESSION AND PLAN: 1.  Acute on chronic systolic heart failure exacerbation. We will consult cardiology. Obtain  strict ins and outs and daily weights. Start her on IV Lasix along with ACE inhibitor. Recheck her echo. Her last echo showed ejection fraction of 35% to 45% and had a nonischemic cardiomyopathy. We will consult cardiology for further evaluation.  2.  Acute cholecystitis based on the abdominal ultrasound. Will get surgical consult. We will also obtain CT of the abdomen and pelvis for further evaluation.  3.  Severe hyponatremia, likely from dehydration. We will start her on gentle hydration and monitor her sodium level. 4.  Elevated troponin, likely due to supply/demand ischemia. We will consult cardiology. Start on aspirin and beta blocker. Obtain serial cardiac enzymes and monitor on telemetry.  5.  Elevated liver function tests, likely from underlying abdominal process. Will obtain an abdominal CT for further evaluation and recheck her LFTs. Avoid any hepatotoxins. 6.  Acute renal failure, likely prerenal etiology. Will monitor her on IV fluids. Recheck her creatinine. 7.  Atrial fibrillation with rapid ventricular response. We will continue her Coreg and digoxin and consider moderate controlling agents if needed. Will leave anticoagulation decision up to cardiology. She is allergic to Coumadin.   We will consult physical therapy for gait evaluation and strength as she may need placement. We will consult social worker and care management also.  TOTAL TIME TAKING CARE OF THIS PATIENT: 55 minutes.   CODE STATUS: FULL.  ____________________________ Kalyna Paolella S. Manuella Ghazi, MD vss:sb D: 09/25/2013 14:00:25 ET T: 09/25/2013 14:48:35 ET JOB#: [id number]  cc: Shakiara Lukic S. Manuella Ghazi, MD, <Dictator> Jerrell Belfast, MD Javier Docker. Ubaldo Glassing, [place] MD ELECTRONICALLY SIGNED 10/01/2013 0:38

## 2014-10-17 DIAGNOSIS — Z01419 Encounter for gynecological examination (general) (routine) without abnormal findings: Secondary | ICD-10-CM | POA: Diagnosis not present

## 2014-10-17 DIAGNOSIS — Z124 Encounter for screening for malignant neoplasm of cervix: Secondary | ICD-10-CM | POA: Diagnosis not present

## 2014-10-17 DIAGNOSIS — Z0000 Encounter for general adult medical examination without abnormal findings: Secondary | ICD-10-CM | POA: Diagnosis not present

## 2014-11-07 ENCOUNTER — Telehealth: Payer: Self-pay

## 2014-11-07 ENCOUNTER — Other Ambulatory Visit: Payer: Self-pay

## 2014-11-07 NOTE — Telephone Encounter (Signed)
Pt scheduled for a colonoscopy at [facility] on 01-21-15. Instructs/rx mailed.

## 2014-11-07 NOTE — Telephone Encounter (Signed)
-----   Message from Otilio Jefferson sent at 10/28/2014 11:09 AM EDT ----- Regarding: triage From: Ulyses Southward Sent: 10/18/2014  please contact for colonoscopy triage

## 2014-11-11 ENCOUNTER — Telehealth: Payer: Self-pay

## 2014-11-11 DIAGNOSIS — F329 Major depressive disorder, single episode, unspecified: Secondary | ICD-10-CM

## 2014-11-11 DIAGNOSIS — M1990 Unspecified osteoarthritis, unspecified site: Secondary | ICD-10-CM

## 2014-11-11 DIAGNOSIS — I10 Essential (primary) hypertension: Secondary | ICD-10-CM

## 2014-11-11 DIAGNOSIS — F32A Depression, unspecified: Secondary | ICD-10-CM

## 2014-11-11 DIAGNOSIS — J449 Chronic obstructive pulmonary disease, unspecified: Secondary | ICD-10-CM

## 2014-11-11 DIAGNOSIS — G5600 Carpal tunnel syndrome, unspecified upper limb: Secondary | ICD-10-CM

## 2014-11-11 DIAGNOSIS — I4891 Unspecified atrial fibrillation: Secondary | ICD-10-CM

## 2014-11-11 DIAGNOSIS — E876 Hypokalemia: Secondary | ICD-10-CM

## 2014-11-11 DIAGNOSIS — D696 Thrombocytopenia, unspecified: Secondary | ICD-10-CM

## 2014-11-11 DIAGNOSIS — Z87898 Personal history of other specified conditions: Secondary | ICD-10-CM

## 2014-11-11 DIAGNOSIS — I509 Heart failure, unspecified: Secondary | ICD-10-CM | POA: Insufficient documentation

## 2014-11-11 HISTORY — DX: Hypokalemia: E87.6

## 2014-11-11 HISTORY — DX: Personal history of other specified conditions: Z87.898

## 2014-11-11 HISTORY — DX: Essential (primary) hypertension: I10

## 2014-11-11 HISTORY — DX: Carpal tunnel syndrome, unspecified upper limb: G56.00

## 2014-11-11 HISTORY — DX: Unspecified osteoarthritis, unspecified site: M19.90

## 2014-11-11 HISTORY — DX: Unspecified atrial fibrillation: I48.91

## 2014-11-11 HISTORY — DX: Chronic obstructive pulmonary disease, unspecified: J44.9

## 2014-11-11 HISTORY — DX: Heart failure, unspecified: I50.9

## 2014-11-11 HISTORY — DX: Thrombocytopenia, unspecified: D69.6

## 2014-11-11 HISTORY — DX: Depression, unspecified: F32.A

## 2014-11-11 NOTE — Telephone Encounter (Signed)
Gastroenterology Pre-Procedure Review  Request Date: 01-21-15 Requesting Physician: Dr. Margarita Rana  PATIENT REVIEW QUESTIONS: The patient responded to the following health history questions as indicated:    1. Are you having any GI issues? no 2. Do you have a personal history of Polyps? no 3. Do you have a family history of Colon Cancer or Polyps? no 4. Diabetes Mellitus? no 5. Joint replacements in the past 12 months?no 6. Major health problems in the past 3 months?no 7. Any artificial heart valves, MVP, or defibrillator?no    MEDICATIONS & ALLERGIES:    Patient reports the following regarding taking any anticoagulation/antiplatelet therapy:   Plavix, Coumadin, Eliquis, Xarelto, Lovenox, Pradaxa, Brilinta, or Effient? no Aspirin? yes (81mg  daily)  Patient confirms/reports the following medications:  Current Outpatient Prescriptions  Medication Sig Dispense Refill  . aspirin EC 81 MG tablet Take 81 mg by mouth daily.    . carvedilol (COREG) 12.5 MG tablet Take 12.5 mg by mouth 2 (two) times daily with a meal.    . digoxin (LANOXIN) 0.25 MG tablet Take 0.25 mg by mouth daily.    Marland Kitchen lisinopril (PRINIVIL,ZESTRIL) 2.5 MG tablet Take 2.5 mg by mouth daily.    . potassium chloride SA (K-DUR,KLOR-CON) 20 MEQ tablet Take 20 mEq by mouth daily.    Marland Kitchen spironolactone (ALDACTONE) 25 MG tablet Take 25 mg by mouth daily.    Marland Kitchen torsemide (DEMADEX) 20 MG tablet Take 20 mg by mouth daily.     No current facility-administered medications for this visit.    Patient confirms/reports the following allergies:  Allergies  Allergen Reactions  . Clonidine Derivatives Rash    No orders of the defined types were placed in this encounter.    AUTHORIZATION INFORMATION Primary Insurance: 1D#: Group #:  Secondary Insurance: 1D#: Group #:  SCHEDULE INFORMATION: Date: 01-21-15 Time: Location: [place]

## 2015-01-21 ENCOUNTER — Encounter: Admission: RE | Payer: Self-pay | Source: Ambulatory Visit

## 2015-01-21 ENCOUNTER — Ambulatory Visit: Admission: RE | Admit: 2015-01-21 | Payer: Self-pay | Source: Ambulatory Visit | Admitting: Gastroenterology

## 2015-01-21 SURGERY — COLONOSCOPY WITH PROPOFOL
Anesthesia: Choice

## 2015-04-10 DIAGNOSIS — I10 Essential (primary) hypertension: Secondary | ICD-10-CM | POA: Insufficient documentation

## 2015-04-10 DIAGNOSIS — Z6826 Body mass index (BMI) 26.0-26.9, adult: Secondary | ICD-10-CM | POA: Insufficient documentation

## 2015-04-10 DIAGNOSIS — I509 Heart failure, unspecified: Secondary | ICD-10-CM | POA: Insufficient documentation

## 2015-04-10 DIAGNOSIS — Z87898 Personal history of other specified conditions: Secondary | ICD-10-CM | POA: Insufficient documentation

## 2015-04-14 ENCOUNTER — Encounter: Payer: Self-pay | Admitting: Family Medicine

## 2015-04-14 ENCOUNTER — Ambulatory Visit (INDEPENDENT_AMBULATORY_CARE_PROVIDER_SITE_OTHER): Payer: Medicare Other | Admitting: Family Medicine

## 2015-04-14 VITALS — BP 110/70 | HR 54 | Temp 98.0°F | Resp 16 | Wt 177.6 lb

## 2015-04-14 DIAGNOSIS — J449 Chronic obstructive pulmonary disease, unspecified: Secondary | ICD-10-CM

## 2015-04-14 DIAGNOSIS — I4891 Unspecified atrial fibrillation: Secondary | ICD-10-CM | POA: Diagnosis not present

## 2015-04-14 DIAGNOSIS — I10 Essential (primary) hypertension: Secondary | ICD-10-CM | POA: Diagnosis not present

## 2015-04-14 DIAGNOSIS — I509 Heart failure, unspecified: Secondary | ICD-10-CM | POA: Diagnosis not present

## 2015-04-14 DIAGNOSIS — Z23 Encounter for immunization: Secondary | ICD-10-CM | POA: Diagnosis not present

## 2015-04-14 MED ORDER — DIGOXIN 250 MCG PO TABS: 0.2500 mg | ORAL_TABLET | Freq: Every day | ORAL | Status: DC

## 2015-04-14 NOTE — Progress Notes (Signed)
       Patient: Angelica Strickland Female    DOB: 09-01-1955   59 y.o.   MRN: [id number] Visit Date: 04/14/2015  Today's Provider: Margarita Rana, MD   Chief Complaint  Patient presents with  . Follow-up     CHF   Subjective:    HPI  Angelica Strickland is a 59 year old here to follow-up on CHF. Patient next Cardiologist appointment is February 2017.  She is feeling very well.  Takes her medication without any difficulty. No edema. Does not have any concerns today.  Has been doing remarkably well. Cardiologist very pleased with her last visit. Last labs stable.  Does not need anything refilled today.  Mood good also.        Allergies  Allergen Reactions  . 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 Clonidine Derivatives Rash   Previous Medications   ASPIRIN EC 325 MG TABLET    Take by mouth.   CARVEDILOL (COREG) 3.125 MG TABLET       DIGOXIN (LANOXIN) 0.25 MG TABLET    Take 0.25 mg by mouth daily.   LISINOPRIL (PRINIVIL,ZESTRIL) 2.5 MG TABLET    Take 2.5 mg by mouth daily.   POTASSIUM CHLORIDE SA (K-DUR,KLOR-CON) 20 MEQ TABLET    Take 20 mEq by mouth daily.   SPIRONOLACTONE (ALDACTONE) 25 MG TABLET    Take 25 mg by mouth daily.   TORSEMIDE (DEMADEX) 20 MG TABLET    Take 20 mg by mouth daily.    Review of Systems  Respiratory: Negative for chest tightness and shortness of breath.   Cardiovascular: Negative for chest pain and leg swelling.  Neurological: Negative for dizziness, syncope, light-headedness, numbness and headaches.    Social History  Substance Use Topics  . Smoking status: Former Smoker    Quit date: 08/23/2010  . Smokeless tobacco: Not on file  . Alcohol Use: No   Objective:   BP 110/70 mmHg  Pulse 54  Temp(Src) 98 F (36.7 C) (Oral)  Resp 16  Wt 177 lb 9.6 oz (80.559 kg)  Physical Exam  Constitutional: She is oriented to person, place, and time. She appears well-developed and well-nourished.  Cardiovascular:  Normal rate.  An irregularly irregular rhythm present.  Pulmonary/Chest: Effort normal and breath sounds normal.  Musculoskeletal: She exhibits no edema.  Neurological: She is alert and oriented to person, place, and time.  Psychiatric: She has a normal mood and affect. Her behavior is normal. Judgment and thought content normal.      Assessment & Plan:      1. Atrial fibrillation, unspecified type ([place]) Condition is stable. Please continue current medication and  plan of care as noted.    2. Chronic congestive heart failure, unspecified congestive heart failure type ([place]) Stable. Rate controlled. Dig level normal last check.   - digoxin (LANOXIN) 0.25 MG tablet; Take 1 tablet (0.25 mg total) by mouth daily.  Dispense: 90 tablet; Refill: 3  3. Essential hypertension Condition is stable. Please continue current medication and  plan of care as noted.    4. Chronic obstructive pulmonary disease, unspecified COPD type ([place]) Stable. No change.    5. Need for influenza vaccination Given today.   - Flu Vaccine QUAD 36+ mos IM        Margarita Rana, MD  [facility]

## 2015-10-21 ENCOUNTER — Encounter: Payer: Self-pay | Admitting: Family Medicine

## 2015-10-21 ENCOUNTER — Ambulatory Visit (INDEPENDENT_AMBULATORY_CARE_PROVIDER_SITE_OTHER): Payer: Medicare Other | Admitting: Family Medicine

## 2015-10-21 ENCOUNTER — Encounter: Payer: Medicare Other | Admitting: Family Medicine

## 2015-10-21 VITALS — BP 100/58 | HR 80 | Temp 98.1°F | Resp 16 | Ht 67.0 in | Wt 184.0 lb

## 2015-10-21 DIAGNOSIS — I10 Essential (primary) hypertension: Secondary | ICD-10-CM | POA: Diagnosis not present

## 2015-10-21 DIAGNOSIS — Z1239 Encounter for other screening for malignant neoplasm of breast: Secondary | ICD-10-CM

## 2015-10-21 DIAGNOSIS — I4891 Unspecified atrial fibrillation: Secondary | ICD-10-CM

## 2015-10-21 DIAGNOSIS — Z1211 Encounter for screening for malignant neoplasm of colon: Secondary | ICD-10-CM

## 2015-10-21 DIAGNOSIS — Z0000 Encounter for general adult medical examination without abnormal findings: Secondary | ICD-10-CM

## 2015-10-21 NOTE — Patient Instructions (Signed)
Please call the [facility] at [facility] to schedule this at [phone]   

## 2015-10-21 NOTE — Progress Notes (Signed)
Patient ID: Angelica Strickland, female   DOB: 11-08-1955, 60 y.o.   MRN: [id number]       Patient: Angelica Strickland, Female    DOB: Jan 01, 1956, 60 y.o.   MRN: [id number] Visit Date: 10/21/2015  Today's Provider: Margarita Rana, MD   Chief Complaint  Patient presents with  . Annual Exam   Subjective:    Annual physical exam Angelica Strickland is a 60 y.o. female who presents today for health maintenance and complete physical. She feels well. She reports exercising active with daily activities. She reports she is sleeping well. 10/17/14 AWE 10/17/14 Pap-WNL 02/27/02 Mammogram-BI-RADS 2  -----------------------------------------------------------------   Review of Systems  Constitutional: Negative.   HENT: Negative.   Eyes: Negative.   Respiratory: Negative.   Cardiovascular: Negative.   Gastrointestinal: Negative.   Endocrine: Negative.   Genitourinary: Negative.   Musculoskeletal: Negative.   Skin: Negative.   Allergic/Immunologic: Negative.   Neurological: Negative.   Hematological: Negative.   Psychiatric/Behavioral: Negative.     Social History      She  reports that she quit smoking about 5 years ago. She has never used smokeless tobacco. She reports that she does not drink alcohol or use illicit drugs.       Social History   Social History  . Marital Status: Single    Spouse Name: N/A  . Number of Children: N/A  . Years of Education: N/A   Social History Main Topics  . Smoking status: Former Smoker    Quit date: 08/23/2010  . Smokeless tobacco: Never Used  . Alcohol Use: No  . Drug Use: No  . Sexual Activity: Not Asked   Other Topics Concern  . None   Social History Narrative    Past Medical History  Diagnosis Date  . Congestive heart failure ([place]) 11/11/2014  . Atrial fibrillation ([place]) 11/11/2014  . Hypopotassemia 11/11/2014  . Depression 11/11/2014  . Essential hypertension 11/11/2014  . COPD (chronic obstructive pulmonary disease) ([place]) 11/11/2014  .  Osteoarthritis 11/11/2014  . Thrombocytopenia ([place]) 11/11/2014  . Carpal tunnel syndrome 11/11/2014  . History of pituitary tumor 11/11/2014     Patient Active Problem List   Diagnosis Date Noted  . A-fib ([place]) 04/10/2015  . Body mass index (BMI) of 26.0-26.9 in adult 04/10/2015  . CCF (congestive cardiac failure) ([place]) 04/10/2015  . CAFL (chronic airflow limitation) ([place]) 04/10/2015  . H/O neoplasm 04/10/2015  . Arthritis, degenerative 04/10/2015  . Change in blood platelet count 04/10/2015  . Atrial fibrillation ([place]) 11/11/2014  . Hypopotassemia 11/11/2014  . Depression 11/11/2014  . Essential hypertension 11/11/2014  . COPD (chronic obstructive pulmonary disease) ([place]) 11/11/2014  . Osteoarthritis 11/11/2014  . Thrombocytopenia ([place]) 11/11/2014  . Carpal tunnel syndrome 11/11/2014  . History of pituitary tumor 11/11/2014  . Abnormal CAT scan 01/07/2012  . Cardiomyopathy ([place]) 01/07/2012  . Personal history of other specified conditions 01/07/2012  . 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 History of cardiac catheterization 01/07/2012    Past Surgical History  Procedure Laterality Date  . Uterine fibroid surgery    . Cardiac catheterization    . Pituitary surgery      Family History        Family Status  Relation Status Death Age  . Mother Deceased   . Father Deceased   . Sister Alive   . Sister Alive   . Sister Alive         Her family history includes Alzheimer's disease in  her mother; Breast cancer in her sister; CAD in her father; Healthy in her sister and sister; Hypertension in her father and mother; Stroke in her mother.    Allergies  Allergen Reactions  . 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 Clonidine Derivatives Rash    Previous Medications   ASPIRIN EC 325 MG TABLET    Take by mouth.   CARVEDILOL (COREG) 3.125 MG TABLET       LISINOPRIL (PRINIVIL,ZESTRIL) 2.5 MG TABLET    Take 2.5 mg by mouth  daily.   POTASSIUM CHLORIDE SA (K-DUR,KLOR-CON) 20 MEQ TABLET    Take 20 mEq by mouth daily.   SPIRONOLACTONE (ALDACTONE) 25 MG TABLET    Take 25 mg by mouth daily.   TORSEMIDE (DEMADEX) 20 MG TABLET    Take 20 mg by mouth daily.    Patient Care Team: Margarita Rana, MD as PCP - General (Family Medicine)     Objective:   Vitals: BP 100/58 mmHg  Pulse 80  Temp(Src) 98.1 F (36.7 C) (Oral)  Resp 16  Ht 5\' 7"  (1.702 m)  Wt 184 lb (83.462 kg)  BMI 28.81 kg/m2   Physical Exam  Constitutional: She is oriented to person, place, and time. She appears well-developed and well-nourished.  HENT:  Head: Normocephalic and atraumatic.  Right Ear: Tympanic membrane, external ear and ear canal normal.  Left Ear: Tympanic membrane, external ear and ear canal normal.  Nose: Nose normal.  Mouth/Throat: Uvula is midline, oropharynx is clear and moist and mucous membranes are normal.  Eyes: Conjunctivae, EOM and lids are normal. Pupils are equal, round, and reactive to light.  Neck: Trachea normal and normal range of motion. Neck supple. Carotid bruit is not present. No thyroid mass and no thyromegaly present.  Cardiovascular: Normal rate, regular rhythm and normal heart sounds.   Pulmonary/Chest: Effort normal and breath sounds normal.  Abdominal: Soft. Normal appearance and bowel sounds are normal. There is no hepatosplenomegaly. There is no tenderness.  Genitourinary: No breast swelling, tenderness or discharge.  Musculoskeletal: Normal range of motion.  Lymphadenopathy:    She has no cervical adenopathy.    She has no axillary adenopathy.  Neurological: She is alert and oriented to person, place, and time. She has normal strength. No cranial nerve deficit.  Skin: Skin is warm, dry and intact.  Psychiatric: She has a normal mood and affect. Her speech is normal and behavior is normal. Judgment and thought content normal. Cognition and memory are normal.   Functional Status Survey: Is the  patient deaf or have difficulty hearing?: No Does the patient have difficulty seeing, even when wearing glasses/contacts?: No Does the patient have difficulty concentrating, remembering, or making decisions?: No Does the patient have difficulty walking or climbing stairs?: No Does the patient have difficulty dressing or bathing?: No Does the patient have difficulty doing errands alone such as visiting a doctor's office or shopping?: No  Fall Risk  10/21/2015  Falls in the past year? No   Depression Screen PHQ 2/9 Scores 10/21/2015  PHQ - 2 Score 0   Cognitive Testing - 6-CIT  Correct? Score   What year is it? yes 0 0 or 4  What month is it? yes 0 0 or 3  Memorize:    Pia Mau,  40,  [place],      What time is it? (within 1 hour) yes 0 0 or 3  Count  backwards from 20 yes 0 0, 2, or 4  Name the months of the year no 1 0, 2, or 4  Repeat name & address above no 2 0, 2, 4, 6, 8, or 10       TOTAL SCORE  3/28   Interpretation:  Normal  Normal (0-7) Abnormal (8-28)      Assessment & Plan:     Routine Health Maintenance and Physical Exam  Exercise Activities and Dietary recommendations Goals    . Exercise 150 minutes per week (moderate activity)       Immunization History  Administered Date(s) Administered  . Influenza,inj,Quad PF,36+ Mos 04/14/2015       1. Medicare annual wellness visit, subsequent Stable. Patient advised to continue eating healthy and exercise daily.  2. Breast cancer screening - MM DIGITAL SCREENING BILATERAL; Future  3. Colon cancer screening - Ambulatory referral to Gastroenterology  4. Essential hypertension Stable. F/U pending lab report. - CBC with Differential/Platelet - Comprehensive metabolic panel - Lipid Panel With LDL/HDL Ratio  5. Atrial fibrillation, unspecified type ([place]) Stable. Patient advised to continue current medication and plan of care. Patient was just seen by Dr. Ubaldo Glassing cardiologist, he D/C Digoxin due to  bradycardia. - TSH     Patient seen and examined by Dr. Jerrell Belfast, and note scribed by Philbert Riser. Dim[place], CMA.  I have reviewed the document for accuracy and completeness and I agree with above. Jerrell Belfast, MD   Margarita Rana, MD   --------------------------------------------------------------------

## 2015-10-22 ENCOUNTER — Telehealth: Payer: Self-pay

## 2015-10-22 ENCOUNTER — Other Ambulatory Visit: Payer: Self-pay | Admitting: Family Medicine

## 2015-10-22 DIAGNOSIS — I509 Heart failure, unspecified: Secondary | ICD-10-CM

## 2015-10-22 LAB — COMPREHENSIVE METABOLIC PANEL
ALT: 6 IU/L (ref 0–32)
AST: 11 IU/L (ref 0–40)
Albumin/Globulin Ratio: 1.6 (ref 1.2–2.2)
Albumin: 4.5 g/dL (ref 3.6–4.8)
Alkaline Phosphatase: 81 IU/L (ref 39–117)
BILIRUBIN TOTAL: 0.3 mg/dL (ref 0.0–1.2)
BUN/Creatinine Ratio: 22 (ref 12–28)
BUN: 19 mg/dL (ref 8–27)
CALCIUM: 9.7 mg/dL (ref 8.7–10.3)
CO2: 26 mmol/L (ref 18–29)
Chloride: 101 mmol/L (ref 96–106)
Creatinine, Ser: 0.86 mg/dL (ref 0.57–1.00)
GFR, EST AFRICAN AMERICAN: 84 mL/min/{1.73_m2} (ref >59)
GFR, EST NON AFRICAN AMERICAN: 73 mL/min/{1.73_m2} (ref >59)
GLUCOSE: 82 mg/dL (ref 65–99)
Globulin, Total: 2.8 g/dL (ref 1.5–4.5)
Potassium: 4.2 mmol/L (ref 3.5–5.2)
Sodium: 144 mmol/L (ref 134–144)
TOTAL PROTEIN: 7.3 g/dL (ref 6.0–8.5)

## 2015-10-22 LAB — CBC WITH DIFFERENTIAL/PLATELET
BASOS: 1 %
Basophils Absolute: 0 10*3/uL (ref 0.0–0.2)
EOS (ABSOLUTE): 0.3 10*3/uL (ref 0.0–0.4)
EOS: 4 %
HEMATOCRIT: 39 % (ref 34.0–46.6)
HEMOGLOBIN: 12.7 g/dL (ref 11.1–15.9)
Immature Grans (Abs): 0 10*3/uL (ref 0.0–0.1)
Immature Granulocytes: 0 %
LYMPHS ABS: 3.3 10*3/uL — AB (ref 0.7–3.1)
Lymphs: 50 %
MCH: 31.6 pg (ref 26.6–33.0)
MCHC: 32.6 g/dL (ref 31.5–35.7)
MCV: 97 fL (ref 79–97)
MONOCYTES: 7 %
MONOS ABS: 0.5 10*3/uL (ref 0.1–0.9)
NEUTROS ABS: 2.5 10*3/uL (ref 1.4–7.0)
Neutrophils: 38 %
PLATELETS: 144 10*3/uL — AB (ref 150–379)
RBC: 4.02 x10E6/uL (ref 3.77–5.28)
RDW: 14.5 % (ref 12.3–15.4)
WBC: 6.6 10*3/uL (ref 3.4–10.8)

## 2015-10-22 LAB — LIPID PANEL WITH LDL/HDL RATIO
Cholesterol, Total: 169 mg/dL (ref 100–199)
HDL: 44 mg/dL (ref >39)
LDL CALC: 95 mg/dL (ref 0–99)
LDL/HDL RATIO: 2.2 ratio (ref 0.0–3.2)
TRIGLYCERIDES: 148 mg/dL (ref 0–149)
VLDL CHOLESTEROL CAL: 30 mg/dL (ref 5–40)

## 2015-10-22 LAB — TSH: TSH: 3 u[IU]/mL (ref 0.450–4.500)

## 2015-10-22 NOTE — Telephone Encounter (Signed)
-----   Message from Margarita Rana, MD sent at 10/22/2015 10:31 AM EDT ----- Labs stable. Please notify patient. Thanks.

## 2015-10-22 NOTE — Telephone Encounter (Signed)
Pt advised as directed below.   Thanks,   -Laura  

## 2015-10-30 ENCOUNTER — Other Ambulatory Visit: Payer: Self-pay | Admitting: Family Medicine

## 2015-11-05 ENCOUNTER — Telehealth: Payer: Self-pay

## 2015-11-05 ENCOUNTER — Other Ambulatory Visit: Payer: Self-pay

## 2015-11-05 NOTE — Telephone Encounter (Signed)
Gastroenterology Pre-Procedure Review  Request Date: 12/23/15 Requesting Physician: Dr. Venia Minks  PATIENT REVIEW QUESTIONS: The patient responded to the following health history questions as indicated:    1. Are you having any GI issues? no 2. Do you have a personal history of Polyps? no 3. Do you have a family history of Colon Cancer or Polyps? no 4. Diabetes Mellitus? no 5. Joint replacements in the past 12 months?no 6. Major health problems in the past 3 months?no 7. Any artificial heart valves, MVP, or defibrillator?no    MEDICATIONS & ALLERGIES:    Patient reports the following regarding taking any anticoagulation/antiplatelet therapy:   Plavix, Coumadin, Eliquis, Xarelto, Lovenox, Pradaxa, Brilinta, or Effient? no Aspirin? yes (ASA 81mg )  Patient confirms/reports the following medications:  Current Outpatient Prescriptions  Medication Sig Dispense Refill  . aspirin EC 325 MG tablet Take by mouth.    . carvedilol (COREG) 3.125 MG tablet     . lisinopril (PRINIVIL,ZESTRIL) 2.5 MG tablet Take 2.5 mg by mouth daily.    . potassium chloride SA (K-DUR,KLOR-CON) 20 MEQ tablet Take 20 mEq by mouth daily.    Marland Kitchen spironolactone (ALDACTONE) 25 MG tablet TAKE ONE TABLET BY MOUTH ONCE DAILY 90 tablet 1  . torsemide (DEMADEX) 20 MG tablet TAKE ONE TABLET BY MOUTH ONCE DAILY 90 tablet 1   No current facility-administered medications for this visit.    Patient confirms/reports the following allergies:  Allergies  Allergen Reactions  . 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 Clonidine Derivatives Rash    No orders of the defined types were placed in this encounter.    AUTHORIZATION INFORMATION Primary Insurance: 1D#: Group #:  Secondary Insurance: 1D#: Group #:  SCHEDULE INFORMATION: Date: 12/23/15 Time: Location: [place]

## 2015-11-20 ENCOUNTER — Encounter: Payer: Medicare Other | Admitting: Family Medicine

## 2015-12-18 ENCOUNTER — Telehealth: Payer: Self-pay

## 2015-12-18 NOTE — Telephone Encounter (Signed)
Patient is feeling sick so she canceled her EGD appointment. Patient will call back to reschedule at a later time.

## 2015-12-23 ENCOUNTER — Encounter: Admission: RE | Payer: Self-pay | Source: Ambulatory Visit

## 2015-12-23 ENCOUNTER — Ambulatory Visit: Admission: RE | Admit: 2015-12-23 | Payer: Medicare Other | Source: Ambulatory Visit | Admitting: Gastroenterology

## 2015-12-23 SURGERY — COLONOSCOPY WITH PROPOFOL
Anesthesia: General

## 2015-12-24 ENCOUNTER — Other Ambulatory Visit: Payer: Self-pay | Admitting: Family Medicine

## 2015-12-29 ENCOUNTER — Encounter: Payer: Self-pay | Admitting: Family Medicine

## 2016-02-02 NOTE — Telephone Encounter (Signed)
error 

## 2016-02-27 ENCOUNTER — Encounter: Payer: Self-pay | Admitting: Physician Assistant

## 2016-02-27 ENCOUNTER — Ambulatory Visit (INDEPENDENT_AMBULATORY_CARE_PROVIDER_SITE_OTHER): Payer: Medicare Other | Admitting: Physician Assistant

## 2016-02-27 VITALS — BP 114/60 | HR 94 | Temp 98.6°F | Resp 20 | Wt 185.0 lb

## 2016-02-27 DIAGNOSIS — J441 Chronic obstructive pulmonary disease with (acute) exacerbation: Secondary | ICD-10-CM | POA: Diagnosis not present

## 2016-02-27 DIAGNOSIS — J449 Chronic obstructive pulmonary disease, unspecified: Secondary | ICD-10-CM

## 2016-02-27 DIAGNOSIS — R05 Cough: Secondary | ICD-10-CM

## 2016-02-27 DIAGNOSIS — R059 Cough, unspecified: Secondary | ICD-10-CM

## 2016-02-27 MED ORDER — ALBUTEROL SULFATE HFA 108 (90 BASE) MCG/ACT IN AERS
1.0000 | INHALATION_SPRAY | Freq: Four times a day (QID) | RESPIRATORY_TRACT | 2 refills | Status: AC | PRN
Start: 2016-02-27 — End: ?

## 2016-02-27 MED ORDER — PREDNISONE 10 MG (21) PO TBPK: ORAL_TABLET | ORAL | 0 refills | Status: DC

## 2016-02-27 MED ORDER — DOXYCYCLINE HYCLATE 100 MG PO TABS: 100.0000 mg | ORAL_TABLET | Freq: Two times a day (BID) | ORAL | 0 refills | Status: AC

## 2016-02-27 MED ORDER — IPRATROPIUM-ALBUTEROL 0.5-2.5 (3) MG/3ML IN SOLN
3.0000 mL | Freq: Once | RESPIRATORY_TRACT | Status: AC
  Administered 2016-02-27: 3 mL via RESPIRATORY_TRACT

## 2016-02-27 NOTE — Patient Instructions (Signed)
Chronic Obstructive Pulmonary Disease Chronic obstructive pulmonary disease (COPD) is a common lung condition in which airflow from the lungs is limited. COPD is a general term that can be used to describe many different lung problems that limit airflow, including both chronic bronchitis and emphysema. If you have COPD, your lung function will probably never return to normal, but there are measures you can take to improve lung function and make yourself feel better. CAUSES   Smoking (common).  Exposure to secondhand smoke.  Genetic problems.  Chronic inflammatory lung diseases or recurrent infections. SYMPTOMS  Shortness of breath, especially with physical activity.  Deep, persistent (chronic) cough with a large amount of thick mucus.  Wheezing.  Rapid breaths (tachypnea).  Gray or bluish discoloration (cyanosis) of the skin, especially in your fingers, toes, or lips.  Fatigue.  Weight loss.  Frequent infections or episodes when breathing symptoms become much worse (exacerbations).  Chest tightness. DIAGNOSIS Your health care provider will take a medical history and perform a physical examination to diagnose COPD. Additional tests for COPD may include:  Lung (pulmonary) function tests.  Chest X-ray.  CT scan.  Blood tests. TREATMENT  Treatment for COPD may include:  Inhaler and nebulizer medicines. These help manage the symptoms of COPD and make your breathing more comfortable.  Supplemental oxygen. Supplemental oxygen is only helpful if you have a low oxygen level in your blood.  Exercise and physical activity. These are beneficial for nearly all people with COPD.  Lung surgery or transplant.  Nutrition therapy to gain weight, if you are underweight.  Pulmonary rehabilitation. This may involve working with a team of health care providers and specialists, such as respiratory, occupational, and physical therapists. HOME CARE INSTRUCTIONS  Take all medicines  (inhaled or pills) as directed by your health care provider.  Avoid over-the-counter medicines or cough syrups that dry up your airway (such as antihistamines) and slow down the elimination of secretions unless instructed otherwise by your health care provider.  If you are a smoker, the most important thing that you can do is stop smoking. Continuing to smoke will cause further lung damage and breathing trouble. Ask your health care provider for help with quitting smoking. He or she can direct you to community resources or hospitals that provide support.  Avoid exposure to irritants such as smoke, chemicals, and fumes that aggravate your breathing.  Use oxygen therapy and pulmonary rehabilitation if directed by your health care provider. If you require home oxygen therapy, ask your health care provider whether you should purchase a pulse oximeter to measure your oxygen level at home.  Avoid contact with individuals who have a contagious illness.  Avoid extreme temperature and humidity changes.  Eat healthy foods. Eating smaller, more frequent meals and resting before meals may help you maintain your strength.  Stay active, but balance activity with periods of rest. Exercise and physical activity will help you maintain your ability to do things you want to do.  Preventing infection and hospitalization is very important when you have COPD. Make sure to receive all the vaccines your health care provider recommends, especially the pneumococcal and influenza vaccines. Ask your health care provider whether you need a pneumonia vaccine.  Learn and use relaxation techniques to manage stress.  Learn and use controlled breathing techniques as directed by your health care provider. Controlled breathing techniques include:  Pursed lip breathing. Start by breathing in (inhaling) through your nose for 1 second. Then, purse your lips as if you were   going to whistle and breathe out (exhale) through the  pursed lips for 2 seconds.  Diaphragmatic breathing. Start by putting one hand on your abdomen just above your waist. Inhale slowly through your nose. The hand on your abdomen should move out. Then purse your lips and exhale slowly. You should be able to feel the hand on your abdomen moving in as you exhale.  Learn and use controlled coughing to clear mucus from your lungs. Controlled coughing is a series of short, progressive coughs. The steps of controlled coughing are: 1. Lean your head slightly forward. 2. Breathe in deeply using diaphragmatic breathing. 3. Try to hold your breath for 3 seconds. 4. Keep your mouth slightly open while coughing twice. 5. Spit any mucus out into a tissue. 6. Rest and repeat the steps once or twice as needed. SEEK MEDICAL CARE IF:  You are coughing up more mucus than usual.  There is a change in the color or thickness of your mucus.  Your breathing is more labored than usual.  Your breathing is faster than usual. SEEK IMMEDIATE MEDICAL CARE IF:  You have shortness of breath while you are resting.  You have shortness of breath that prevents you from:  Being able to talk.  Performing your usual physical activities.  You have chest pain lasting longer than 5 minutes.  Your skin color is more cyanotic than usual.  You measure low oxygen saturations for longer than 5 minutes with a pulse oximeter. MAKE SURE YOU:  Understand these instructions.  Will watch your condition.  Will get help right away if you are not doing well or get worse.   This information is not intended to replace advice given to you by your health care provider. Make sure you discuss any questions you have with your health care provider.   Document Released: 02/17/2005 Document Revised: 05/31/2014 Document Reviewed: 01/04/2013 Elsevier Interactive Patient Education 2016 [employer]  

## 2016-02-27 NOTE — Progress Notes (Signed)
Patient: Angelica Strickland Female    DOB: [birth year]   60 y.o.   MRN: [id number] Visit Date: 02/27/2016  Today's Provider: Trinna Post, PA-C   Chief Complaint  Patient presents with  . URI   Subjective:    URI   This is a new problem. The current episode started 1 to 4 weeks ago (x 3 weeks). The problem has been waxing and waning (was improving, but has since worsened). There has been no fever. Associated symptoms include congestion, coughing (productive with green/yellow sputum), rhinorrhea, sinus pain, sneezing and wheezing. Pertinent negatives include no abdominal pain, chest pain, ear pain, headaches, nausea, plugged ear sensation, sore throat, swollen glands or vomiting. Associated symptoms comments: Left side pain. She has tried nothing for the symptoms.  Also c/o dyspnea and loss of sleep. Is requesting something to help with sleep. States she has a H/O pneumonia, states it "might" feel similar due to left side pain, but denies fever.  Patient is a 60 year old female with PMH significant for heart failure and COPD presenting today complaining of intermittent congestions, productive cough with increased yellow sputum, and dyspnea. She denies nausea, vomiting, fever, chills. She reports wheezing.   Social History   Social History  . Marital status: Single    Spouse name: N/A  . Number of children: N/A  . Years of education: N/A   Social History Main Topics  . Smoking status: Former Smoker    Quit date: 08/23/2010  . Smokeless tobacco: Never Used  . Alcohol use No  . Drug use: No  . Sexual activity: Not Asked   Other Topics Concern  . None   Social History Narrative  . None       Allergies  Allergen Reactions  . 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 Clonidine Derivatives Rash     Current Outpatient Prescriptions:  .  aspirin EC 325 MG tablet, Take by mouth., Disp: , Rfl:  .  carvedilol (COREG) 12.5 MG  tablet, Take by mouth 2 (two) times daily with a meal. , Disp: , Rfl:  .  KLOR-CON M20 20 MEQ tablet, TAKE ONE TABLET BY MOUTH ONCE DAILY, Disp: 90 tablet, Rfl: 3 .  lisinopril (PRINIVIL,ZESTRIL) 2.5 MG tablet, Take 2.5 mg by mouth daily., Disp: , Rfl:  .  spironolactone (ALDACTONE) 25 MG tablet, TAKE ONE TABLET BY MOUTH ONCE DAILY, Disp: 90 tablet, Rfl: 1 .  torsemide (DEMADEX) 20 MG tablet, TAKE ONE TABLET BY MOUTH ONCE DAILY, Disp: 90 tablet, Rfl: 1  Review of Systems  HENT: Positive for congestion, rhinorrhea and sneezing. Negative for ear pain and sore throat.   Respiratory: Positive for cough (productive with green/yellow sputum), shortness of breath and wheezing.   Cardiovascular: Negative for chest pain.  Gastrointestinal: Negative for abdominal pain, nausea and vomiting.  Neurological: Negative for headaches.    Social History  Substance Use Topics  . Smoking status: Former Smoker    Quit date: 08/23/2010  . Smokeless tobacco: Never Used  . Alcohol use No   Objective:   BP 114/60 (BP Location: Left Arm, Patient Position: Sitting, Cuff Size: Large)   Pulse 94 Comment: irregular  Temp 98.6 F (37 C) (Oral)   Resp 20   Wt 185 lb (83.9 kg)   SpO2 98%   BMI 28.98 kg/m   Physical Exam  Constitutional: She is oriented to person, place, and time. She  appears well-developed and well-nourished. No distress.  HENT:  Right Ear: Tympanic membrane normal.  Left Ear: Tympanic membrane and external ear normal.  Mouth/Throat: Oropharynx is clear and moist. No oropharyngeal exudate.  Eyes: Left eye exhibits no discharge.  Cardiovascular: Normal rate and normal heart sounds.   Pulmonary/Chest: No accessory muscle usage. Tachypnea noted. No respiratory distress. She has wheezes in the right lower field and the left lower field. She has rhonchi in the right upper field, the right middle field, the right lower field, the left upper field, the left middle field and the left lower field. She  has no rales.  Patient SOB in exam room, pausing for breaths in between words.   Musculoskeletal: She exhibits no edema.  Lymphadenopathy:    She has no cervical adenopathy.  Neurological: She is alert and oriented to person, place, and time.  Skin: Skin is warm and dry. She is not diaphoretic.  Psychiatric: She has a normal mood and affect. Her behavior is normal.        Assessment & Plan:     COPD exacerbation (HCC)  Cough - Plan: DG Chest 2 View, doxycycline (VIBRA-TABS) 100 MG tablet, predniSONE (STERAPRED UNI-PAK 21 TAB) 10 MG (21) TBPK tablet, ipratropium-albuterol (DUONEB) 0.5-2.5 (3) MG/3ML nebulizer solution 3 mL  Chronic obstructive pulmonary disease, unspecified COPD type ([place]) - Plan: albuterol (PROVENTIL HFA;VENTOLIN HFA) 108 (90 Base) MCG/ACT inhaler  Patient is 60 year old woman presenting today with a COPD exacerbation, less concerned for sinusitis and pneumonia. Administered in office DuoNeb which resulted in marked improvement in patient's dyspnea, as well as decreased ronchi and wheezing. Gave patient Rx for doxycycline 100 mg BID x 10 days, prednisone 30 mg x 5 days, and albuterol inhaler, 1 puff Q6H PRN. Ordered X-ray for patient to investigate possibility of pneumonia and for follow up purposes, but patient transported here via Medicaid services and they only do one location per trip. Advised patient that the antibiotic she is on now would cover her for pneumonia as well, and she may get her CXR on Monday when she was able to be transported. Advised patient to seek emergency care if severe increase in SOB, nausea, vomiting, fever.   Return in about 6 days (around 03/04/2016).  The entirety of the information documented in the History of Present Illness, Review of Systems and Physical Exam were personally obtained by me. Portions of this information were initially documented by Raquel Sarna and reviewed by me for thoroughness and accuracy.   Patient Instructions  Chronic  Obstructive Pulmonary Disease Chronic obstructive pulmonary disease (COPD) is a common lung condition in which airflow from the lungs is limited. COPD is a general term that can be used to describe many different lung problems that limit airflow, including both chronic bronchitis and emphysema. If you have COPD, your lung function will probably never return to normal, but there are measures you can take to improve lung function and make yourself feel better. CAUSES   Smoking (common).  Exposure to secondhand smoke.  Genetic problems.  Chronic inflammatory lung diseases or recurrent infections. SYMPTOMS  Shortness of breath, especially with physical activity.  Deep, persistent (chronic) cough with a large amount of thick mucus.  Wheezing.  Rapid breaths (tachypnea).  Gray or bluish discoloration (cyanosis) of the skin, especially in your fingers, toes, or lips.  Fatigue.  Weight loss.  Frequent infections or episodes when breathing symptoms become much worse (exacerbations).  Chest tightness. DIAGNOSIS Your health care provider will take a  medical history and perform a physical examination to diagnose COPD. Additional tests for COPD may include:  Lung (pulmonary) function tests.  Chest X-ray.  CT scan.  Blood tests. TREATMENT  Treatment for COPD may include:  Inhaler and nebulizer medicines. These help manage the symptoms of COPD and make your breathing more comfortable.  Supplemental oxygen. Supplemental oxygen is only helpful if you have a low oxygen level in your blood.  Exercise and physical activity. These are beneficial for nearly all people with COPD.  Lung surgery or transplant.  Nutrition therapy to gain weight, if you are underweight.  Pulmonary rehabilitation. This may involve working with a team of health care providers and specialists, such as respiratory, occupational, and physical therapists. HOME CARE INSTRUCTIONS  Take all medicines (inhaled or  pills) as directed by your health care provider.  Avoid over-the-counter medicines or cough syrups that dry up your airway (such as antihistamines) and slow down the elimination of secretions unless instructed otherwise by your health care provider.  If you are a smoker, the most important thing that you can do is stop smoking. Continuing to smoke will cause further lung damage and breathing trouble. Ask your health care provider for help with quitting smoking. He or she can direct you to community resources or hospitals that provide support.  Avoid exposure to irritants such as smoke, chemicals, and fumes that aggravate your breathing.  Use oxygen therapy and pulmonary rehabilitation if directed by your health care provider. If you require home oxygen therapy, ask your health care provider whether you should purchase a pulse oximeter to measure your oxygen level at home.  Avoid contact with individuals who have a contagious illness.  Avoid extreme temperature and humidity changes.  Eat healthy foods. Eating smaller, more frequent meals and resting before meals may help you maintain your strength.  Stay active, but balance activity with periods of rest. Exercise and physical activity will help you maintain your ability to do things you want to do.  Preventing infection and hospitalization is very important when you have COPD. Make sure to receive all the vaccines your health care provider recommends, especially the pneumococcal and influenza vaccines. Ask your health care provider whether you need a pneumonia vaccine.  Learn and use relaxation techniques to manage stress.  Learn and use controlled breathing techniques as directed by your health care provider. Controlled breathing techniques include:  Pursed lip breathing. Start by breathing in (inhaling) through your nose for 1 second. Then, purse your lips as if you were going to whistle and breathe out (exhale) through the pursed lips for 2  seconds.  Diaphragmatic breathing. Start by putting one hand on your abdomen just above your waist. Inhale slowly through your nose. The hand on your abdomen should move out. Then purse your lips and exhale slowly. You should be able to feel the hand on your abdomen moving in as you exhale.  Learn and use controlled coughing to clear mucus from your lungs. Controlled coughing is a series of short, progressive coughs. The steps of controlled coughing are: 1. Lean your head slightly forward. 2. Breathe in deeply using diaphragmatic breathing. 3. Try to hold your breath for 3 seconds. 4. Keep your mouth slightly open while coughing twice. 5. Spit any mucus out into a tissue. 6. Rest and repeat the steps once or twice as needed. SEEK MEDICAL CARE IF:  You are coughing up more mucus than usual.  There is a change in the color or thickness of  your mucus.  Your breathing is more labored than usual.  Your breathing is faster than usual. SEEK IMMEDIATE MEDICAL CARE IF:  You have shortness of breath while you are resting.  You have shortness of breath that prevents you from:  Being able to talk.  Performing your usual physical activities.  You have chest pain lasting longer than 5 minutes.  Your skin color is more cyanotic than usual.  You measure low oxygen saturations for longer than 5 minutes with a pulse oximeter. MAKE SURE YOU:  Understand these instructions.  Will watch your condition.  Will get help right away if you are not doing well or get worse.   This information is not intended to replace advice given to you by your health care provider. Make sure you discuss any questions you have with your health care provider.   Document Released: 02/17/2005 Document Revised: 05/31/2014 Document Reviewed: 01/04/2013 Elsevier Interactive Patient Education 2016 [place], [facility]

## 2016-03-01 ENCOUNTER — Ambulatory Visit
Admission: RE | Admit: 2016-03-01 | Discharge: 2016-03-01 | Disposition: A | Discharge status: Home / Self Care | Payer: Medicare Other | Source: Ambulatory Visit | Attending: Physician Assistant | Admitting: Physician Assistant

## 2016-03-01 DIAGNOSIS — R05 Cough: Secondary | ICD-10-CM

## 2016-03-01 DIAGNOSIS — I517 Cardiomegaly: Secondary | ICD-10-CM | POA: Insufficient documentation

## 2016-03-01 DIAGNOSIS — J449 Chronic obstructive pulmonary disease, unspecified: Secondary | ICD-10-CM | POA: Insufficient documentation

## 2016-03-01 DIAGNOSIS — I700 Atherosclerosis of aorta: Secondary | ICD-10-CM | POA: Diagnosis not present

## 2016-03-01 DIAGNOSIS — R059 Cough, unspecified: Secondary | ICD-10-CM

## 2016-03-02 ENCOUNTER — Telehealth: Payer: Self-pay

## 2016-03-02 NOTE — Telephone Encounter (Signed)
Pt advised.   Thanks,   -Kamara Allan  

## 2016-03-04 ENCOUNTER — Ambulatory Visit (INDEPENDENT_AMBULATORY_CARE_PROVIDER_SITE_OTHER): Payer: Medicare Other | Admitting: Physician Assistant

## 2016-03-04 ENCOUNTER — Encounter: Payer: Self-pay | Admitting: Physician Assistant

## 2016-03-04 VITALS — BP 108/62 | HR 72 | Temp 98.4°F | Resp 16 | Wt 195.0 lb

## 2016-03-04 DIAGNOSIS — J449 Chronic obstructive pulmonary disease, unspecified: Secondary | ICD-10-CM

## 2016-03-04 NOTE — Patient Instructions (Signed)
Chronic Obstructive Pulmonary Disease Chronic obstructive pulmonary disease (COPD) is a common lung condition in which airflow from the lungs is limited. COPD is a general term that can be used to describe many different lung problems that limit airflow, including both chronic bronchitis and emphysema. If you have COPD, your lung function will probably never return to normal, but there are measures you can take to improve lung function and make yourself feel better. CAUSES   Smoking (common).  Exposure to secondhand smoke.  Genetic problems.  Chronic inflammatory lung diseases or recurrent infections. SYMPTOMS  Shortness of breath, especially with physical activity.  Deep, persistent (chronic) cough with a large amount of thick mucus.  Wheezing.  Rapid breaths (tachypnea).  Gray or bluish discoloration (cyanosis) of the skin, especially in your fingers, toes, or lips.  Fatigue.  Weight loss.  Frequent infections or episodes when breathing symptoms become much worse (exacerbations).  Chest tightness. DIAGNOSIS Your health care provider will take a medical history and perform a physical examination to diagnose COPD. Additional tests for COPD may include:  Lung (pulmonary) function tests.  Chest X-ray.  CT scan.  Blood tests. TREATMENT  Treatment for COPD may include:  Inhaler and nebulizer medicines. These help manage the symptoms of COPD and make your breathing more comfortable.  Supplemental oxygen. Supplemental oxygen is only helpful if you have a low oxygen level in your blood.  Exercise and physical activity. These are beneficial for nearly all people with COPD.  Lung surgery or transplant.  Nutrition therapy to gain weight, if you are underweight.  Pulmonary rehabilitation. This may involve working with a team of health care providers and specialists, such as respiratory, occupational, and physical therapists. HOME CARE INSTRUCTIONS  Take all medicines  (inhaled or pills) as directed by your health care provider.  Avoid over-the-counter medicines or cough syrups that dry up your airway (such as antihistamines) and slow down the elimination of secretions unless instructed otherwise by your health care provider.  If you are a smoker, the most important thing that you can do is stop smoking. Continuing to smoke will cause further lung damage and breathing trouble. Ask your health care provider for help with quitting smoking. He or she can direct you to community resources or hospitals that provide support.  Avoid exposure to irritants such as smoke, chemicals, and fumes that aggravate your breathing.  Use oxygen therapy and pulmonary rehabilitation if directed by your health care provider. If you require home oxygen therapy, ask your health care provider whether you should purchase a pulse oximeter to measure your oxygen level at home.  Avoid contact with individuals who have a contagious illness.  Avoid extreme temperature and humidity changes.  Eat healthy foods. Eating smaller, more frequent meals and resting before meals may help you maintain your strength.  Stay active, but balance activity with periods of rest. Exercise and physical activity will help you maintain your ability to do things you want to do.  Preventing infection and hospitalization is very important when you have COPD. Make sure to receive all the vaccines your health care provider recommends, especially the pneumococcal and influenza vaccines. Ask your health care provider whether you need a pneumonia vaccine.  Learn and use relaxation techniques to manage stress.  Learn and use controlled breathing techniques as directed by your health care provider. Controlled breathing techniques include:  Pursed lip breathing. Start by breathing in (inhaling) through your nose for 1 second. Then, purse your lips as if you were   going to whistle and breathe out (exhale) through the  pursed lips for 2 seconds.  Diaphragmatic breathing. Start by putting one hand on your abdomen just above your waist. Inhale slowly through your nose. The hand on your abdomen should move out. Then purse your lips and exhale slowly. You should be able to feel the hand on your abdomen moving in as you exhale.  Learn and use controlled coughing to clear mucus from your lungs. Controlled coughing is a series of short, progressive coughs. The steps of controlled coughing are: 1. Lean your head slightly forward. 2. Breathe in deeply using diaphragmatic breathing. 3. Try to hold your breath for 3 seconds. 4. Keep your mouth slightly open while coughing twice. 5. Spit any mucus out into a tissue. 6. Rest and repeat the steps once or twice as needed. SEEK MEDICAL CARE IF:  You are coughing up more mucus than usual.  There is a change in the color or thickness of your mucus.  Your breathing is more labored than usual.  Your breathing is faster than usual. SEEK IMMEDIATE MEDICAL CARE IF:  You have shortness of breath while you are resting.  You have shortness of breath that prevents you from:  Being able to talk.  Performing your usual physical activities.  You have chest pain lasting longer than 5 minutes.  Your skin color is more cyanotic than usual.  You measure low oxygen saturations for longer than 5 minutes with a pulse oximeter. MAKE SURE YOU:  Understand these instructions.  Will watch your condition.  Will get help right away if you are not doing well or get worse.   This information is not intended to replace advice given to you by your health care provider. Make sure you discuss any questions you have with your health care provider.   Document Released: 02/17/2005 Document Revised: 05/31/2014 Document Reviewed: 01/04/2013 Elsevier Interactive Patient Education 2016 [employer]  

## 2016-03-04 NOTE — Progress Notes (Signed)
Patient: Angelica Strickland Female    DOB: [birth year]   60 y.o.   MRN: [id number] Visit Date: 03/04/2016  Today's Provider: Trinna Post, PA-C   Chief Complaint  Patient presents with  . COPD   Subjective:    Breathing Problem  She complains of cough, difficulty breathing and shortness of breath. There is no wheezing. This is a chronic problem. The problem has been gradually improving. The cough is non-productive. Associated symptoms include dyspnea on exertion. Pertinent negatives include no appetite change, chest pain, ear congestion, ear pain, fever, headaches, heartburn, malaise/fatigue, myalgias, nasal congestion, orthopnea, PND, postnasal drip, rhinorrhea, sneezing, sore throat, sweats, trouble swallowing or weight loss. Her symptoms are alleviated by oral steroids and steroid inhaler (Antibiotic). She reports significant (Pt reports feeling 90% better.) improvement on treatment. Her past medical history is significant for COPD.   Patient with history of COPD and exacerbation on last visit presents to clinic today for followup. She reports completing prednisone and having a few more days left of doxycycline. She reports feeling much better.    Allergies  Allergen Reactions  . 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 Clonidine Derivatives Rash     Current Outpatient Prescriptions:  .  albuterol (PROVENTIL HFA;VENTOLIN HFA) 108 (90 Base) MCG/ACT inhaler, Inhale 1 puff into the lungs every 6 (six) hours as needed for wheezing or shortness of breath., Disp: 1 Inhaler, Rfl: 2 .  aspirin EC 325 MG tablet, Take by mouth., Disp: , Rfl:  .  carvedilol (COREG) 12.5 MG tablet, Take by mouth 2 (two) times daily with a meal. , Disp: , Rfl:  .  doxycycline (VIBRA-TABS) 100 MG tablet, Take 1 tablet (100 mg total) by mouth 2 (two) times daily., Disp: 20 tablet, Rfl: 0 .  KLOR-CON M20 20 MEQ tablet, TAKE ONE TABLET BY MOUTH ONCE DAILY, Disp: 90  tablet, Rfl: 3 .  lisinopril (PRINIVIL,ZESTRIL) 2.5 MG tablet, Take 2.5 mg by mouth daily., Disp: , Rfl:  .  spironolactone (ALDACTONE) 25 MG tablet, TAKE ONE TABLET BY MOUTH ONCE DAILY, Disp: 90 tablet, Rfl: 1 .  torsemide (DEMADEX) 20 MG tablet, TAKE ONE TABLET BY MOUTH ONCE DAILY, Disp: 90 tablet, Rfl: 1  Review of Systems  Constitutional: Negative.  Negative for appetite change, fever, malaise/fatigue and weight loss.  HENT: Negative.  Negative for ear pain, postnasal drip, rhinorrhea, sneezing, sore throat and trouble swallowing.   Eyes: Negative.   Respiratory: Positive for cough and shortness of breath. Negative for apnea, choking, chest tightness, wheezing and stridor.        Pt reports a significant improvement.    Cardiovascular: Positive for dyspnea on exertion. Negative for chest pain, palpitations, leg swelling and PND.  Gastrointestinal: Negative.  Negative for heartburn.  Musculoskeletal: Negative for myalgias.  Neurological: Negative.  Negative for headaches.    Social History  Substance Use Topics  . Smoking status: Former Smoker    Quit date: 08/23/2010  . Smokeless tobacco: Never Used  . Alcohol use No   Objective:   BP 108/62 (BP Location: Left Arm, Patient Position: Sitting, Cuff Size: Large)   Pulse 72   Temp 98.4 F (36.9 C) (Oral)   Resp 16   Wt 195 lb (88.5 kg)   SpO2 98%   BMI 30.54 kg/m   Physical Exam  Constitutional: She is oriented to person, place, and time. She appears well-developed and well-nourished. No  distress.  HENT:  Head: Normocephalic.  Right Ear: External ear normal.  Left Ear: External ear normal.  Nose: Nose normal.  Mouth/Throat: Oropharynx is clear and moist.  Eyes: Conjunctivae are normal. Right eye exhibits no discharge. Left eye exhibits no discharge.  Neck: Neck supple.  Cardiovascular: Normal rate, regular rhythm and normal heart sounds.   Pulmonary/Chest: Effort normal and breath sounds normal. No respiratory distress.  She has no wheezes. She has no rales.  Lymphadenopathy:    She has no cervical adenopathy.  Neurological: She is alert and oriented to person, place, and time.  Skin: Skin is warm and dry. She is not diaphoretic.  Psychiatric: She has a normal mood and affect. Her behavior is normal.        Assessment & Plan:      Problem List Items Addressed This Visit    COPD (chronic obstructive pulmonary disease) ([place]) - Primary    Other Visit Diagnoses   None.    Patient is 60 y/o female following up for COPD exacerbation. She looks much better today. No respiratory distress or increased work of breathing today in office. Her lungs are much improved, no wheezes or ronchi. She got her chest xray earlier this week which did not show pneumonia. I have independently viewed these images. Patient may follow up as needed.      Return if symptoms worsen or fail to improve.  The entirety of the information documented in the History of Present Illness, Review of Systems and Physical Exam were personally obtained by me. Portions of this information were initially documented by Ashley Royalty, CMA and reviewed by me for thoroughness and accuracy.   Patient Instructions  Chronic Obstructive Pulmonary Disease Chronic obstructive pulmonary disease (COPD) is a common lung condition in which airflow from the lungs is limited. COPD is a general term that can be used to describe many different lung problems that limit airflow, including both chronic bronchitis and emphysema. If you have COPD, your lung function will probably never return to normal, but there are measures you can take to improve lung function and make yourself feel better. CAUSES   Smoking (common).  Exposure to secondhand smoke.  Genetic problems.  Chronic inflammatory lung diseases or recurrent infections. SYMPTOMS  Shortness of breath, especially with physical activity.  Deep, persistent (chronic) cough with a large amount of thick  mucus.  Wheezing.  Rapid breaths (tachypnea).  Gray or bluish discoloration (cyanosis) of the skin, especially in your fingers, toes, or lips.  Fatigue.  Weight loss.  Frequent infections or episodes when breathing symptoms become much worse (exacerbations).  Chest tightness. DIAGNOSIS Your health care provider will take a medical history and perform a physical examination to diagnose COPD. Additional tests for COPD may include:  Lung (pulmonary) function tests.  Chest X-ray.  CT scan.  Blood tests. TREATMENT  Treatment for COPD may include:  Inhaler and nebulizer medicines. These help manage the symptoms of COPD and make your breathing more comfortable.  Supplemental oxygen. Supplemental oxygen is only helpful if you have a low oxygen level in your blood.  Exercise and physical activity. These are beneficial for nearly all people with COPD.  Lung surgery or transplant.  Nutrition therapy to gain weight, if you are underweight.  Pulmonary rehabilitation. This may involve working with a team of health care providers and specialists, such as respiratory, occupational, and physical therapists. HOME CARE INSTRUCTIONS  Take all medicines (inhaled or pills) as directed by your health care provider.  Avoid over-the-counter medicines or cough syrups that dry up your airway (such as antihistamines) and slow down the elimination of secretions unless instructed otherwise by your health care provider.  If you are a smoker, the most important thing that you can do is stop smoking. Continuing to smoke will cause further lung damage and breathing trouble. Ask your health care provider for help with quitting smoking. He or she can direct you to community resources or hospitals that provide support.  Avoid exposure to irritants such as smoke, chemicals, and fumes that aggravate your breathing.  Use oxygen therapy and pulmonary rehabilitation if directed by your health care provider.  If you require home oxygen therapy, ask your health care provider whether you should purchase a pulse oximeter to measure your oxygen level at home.  Avoid contact with individuals who have a contagious illness.  Avoid extreme temperature and humidity changes.  Eat healthy foods. Eating smaller, more frequent meals and resting before meals may help you maintain your strength.  Stay active, but balance activity with periods of rest. Exercise and physical activity will help you maintain your ability to do things you want to do.  Preventing infection and hospitalization is very important when you have COPD. Make sure to receive all the vaccines your health care provider recommends, especially the pneumococcal and influenza vaccines. Ask your health care provider whether you need a pneumonia vaccine.  Learn and use relaxation techniques to manage stress.  Learn and use controlled breathing techniques as directed by your health care provider. Controlled breathing techniques include:  Pursed lip breathing. Start by breathing in (inhaling) through your nose for 1 second. Then, purse your lips as if you were going to whistle and breathe out (exhale) through the pursed lips for 2 seconds.  Diaphragmatic breathing. Start by putting one hand on your abdomen just above your waist. Inhale slowly through your nose. The hand on your abdomen should move out. Then purse your lips and exhale slowly. You should be able to feel the hand on your abdomen moving in as you exhale.  Learn and use controlled coughing to clear mucus from your lungs. Controlled coughing is a series of short, progressive coughs. The steps of controlled coughing are: 1. Lean your head slightly forward. 2. Breathe in deeply using diaphragmatic breathing. 3. Try to hold your breath for 3 seconds. 4. Keep your mouth slightly open while coughing twice. 5. Spit any mucus out into a tissue. 6. Rest and repeat the steps once or twice as  needed. SEEK MEDICAL CARE IF:  You are coughing up more mucus than usual.  There is a change in the color or thickness of your mucus.  Your breathing is more labored than usual.  Your breathing is faster than usual. SEEK IMMEDIATE MEDICAL CARE IF:  You have shortness of breath while you are resting.  You have shortness of breath that prevents you from:  Being able to talk.  Performing your usual physical activities.  You have chest pain lasting longer than 5 minutes.  Your skin color is more cyanotic than usual.  You measure low oxygen saturations for longer than 5 minutes with a pulse oximeter. MAKE SURE YOU:  Understand these instructions.  Will watch your condition.  Will get help right away if you are not doing well or get worse.   This information is not intended to replace advice given to you by your health care provider. Make sure you discuss any questions you have with your health  care provider.   Document Released: 02/17/2005 Document Revised: 05/31/2014 Document Reviewed: 01/04/2013 Elsevier Interactive Patient Education 2016 [place], [facility]

## 2016-04-21 ENCOUNTER — Other Ambulatory Visit: Payer: Self-pay | Admitting: Family Medicine

## 2016-04-21 DIAGNOSIS — I509 Heart failure, unspecified: Secondary | ICD-10-CM

## 2016-04-27 ENCOUNTER — Ambulatory Visit (INDEPENDENT_AMBULATORY_CARE_PROVIDER_SITE_OTHER): Payer: Medicare Other | Admitting: Family Medicine

## 2016-04-27 ENCOUNTER — Encounter: Payer: Self-pay | Admitting: Family Medicine

## 2016-04-27 VITALS — BP 100/60 | HR 66 | Temp 98.1°F | Resp 16 | Ht 67.0 in | Wt 195.0 lb

## 2016-04-27 DIAGNOSIS — L723 Sebaceous cyst: Secondary | ICD-10-CM | POA: Diagnosis not present

## 2016-04-27 DIAGNOSIS — Z87891 Personal history of nicotine dependence: Secondary | ICD-10-CM

## 2016-04-27 DIAGNOSIS — Z87898 Personal history of other specified conditions: Secondary | ICD-10-CM | POA: Diagnosis not present

## 2016-04-27 DIAGNOSIS — I4891 Unspecified atrial fibrillation: Secondary | ICD-10-CM

## 2016-04-27 DIAGNOSIS — I10 Essential (primary) hypertension: Secondary | ICD-10-CM | POA: Diagnosis not present

## 2016-04-27 DIAGNOSIS — Z23 Encounter for immunization: Secondary | ICD-10-CM | POA: Diagnosis not present

## 2016-04-27 DIAGNOSIS — J449 Chronic obstructive pulmonary disease, unspecified: Secondary | ICD-10-CM

## 2016-04-27 MED ORDER — TORSEMIDE 20 MG PO TABS: 20.0000 mg | ORAL_TABLET | Freq: Every day | ORAL | 4 refills | Status: DC

## 2016-04-27 NOTE — Patient Instructions (Addendum)
Screening for lung cancer is recommended for people between 55 and 62 years of age who have smoked at 1 pack per day or more for at least 30 years. Please call our office at [phone] to schedule a low dose CT lung scan for lung cancer screening.   

## 2016-04-27 NOTE — Progress Notes (Signed)
Patient: Angelica Strickland Female    DOB: 1956-05-15   60 y.o.   MRN: [id number] Visit Date: 04/27/2016  Today's Provider: Lelon Huh, MD   Chief Complaint  Patient presents with  . Follow-up  . Hypertension   Subjective:    HPI This is a previous patient of Dr. Venia Minks present today as new patient to me to establish care and follow up on chronic medical problems.    Atrial fibrillation, unspecified type ([place]) Is feeling well on current medications. Followed by Dr. Ubaldo Glassing cardiologist, D/C Digoxin due to bradycardia in February . Denies racing heart or palpitations. Is on daily aspirin for anticoagulation. Not on warfarin due to history of thrombocytopenia and history head bleed by patient report.    Hypertension, follow-up:  BP Readings from Last 3 Encounters:  03/04/16 108/62  02/27/16 114/60  10/21/15 (!) 100/58    She was last seen for hypertension 7 months ago.  BP at that visit was 100/58. Management since that visit includes; no changes.She reports good compliance with treatment. She is not having side effects. none She is not exercising. She is adherent to low salt diet.   Outside blood pressures are n/a. She is experiencing none.  Patient denies none.   Cardiovascular risk factors include none.  Use of agents associated with hypertension: none.   ---------------------------------------------------------------- Also complains of lumb on back of scalp that has been there for years but seems to be getting larger. Not painful at all.    Allergies  Allergen Reactions  . 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 Clonidine Derivatives Rash     Current Outpatient Prescriptions:  .  albuterol (PROVENTIL HFA;VENTOLIN HFA) 108 (90 Base) MCG/ACT inhaler, Inhale 1 puff into the lungs every 6 (six) hours as needed for wheezing or shortness of breath., Disp: 1 Inhaler, Rfl: 2 .  aspirin EC 325 MG tablet, Take by mouth.,  Disp: , Rfl:  .  carvedilol (COREG) 12.5 MG tablet, Take by mouth 2 (two) times daily with a meal. , Disp: , Rfl:  .  KLOR-CON M20 20 MEQ tablet, TAKE ONE TABLET BY MOUTH ONCE DAILY, Disp: 90 tablet, Rfl: 3 .  lisinopril (PRINIVIL,ZESTRIL) 2.5 MG tablet, Take 2.5 mg by mouth daily., Disp: , Rfl:  .  spironolactone (ALDACTONE) 25 MG tablet, TAKE ONE TABLET BY MOUTH ONCE DAILY, Disp: 90 tablet, Rfl: 1 .  torsemide (DEMADEX) 20 MG tablet, TAKE ONE TABLET BY MOUTH ONCE DAILY, Disp: 90 tablet, Rfl: 1  Review of Systems  Constitutional: Negative for appetite change, chills, fatigue and fever.  Respiratory: Negative for chest tightness and shortness of breath.   Cardiovascular: Negative for chest pain and palpitations.  Gastrointestinal: Negative for abdominal pain, nausea and vomiting.  Neurological: Negative for dizziness and weakness.    Social History  Substance Use Topics  . Smoking status: Former Smoker    Quit date: 08/23/2010  . Smokeless tobacco: Never Used  . Alcohol use No   Objective:   BP 100/60 (BP Location: Right Arm, Patient Position: Sitting, Cuff Size: Large)   Pulse 66   Temp 98.1 F (36.7 C) (Oral)   Resp 16   Ht 5\' 7"  (1.702 m)   Wt 195 lb (88.5 kg)   SpO2 100%   BMI 30.54 kg/m   Physical Exam   General Appearance:    Alert, cooperative, no distress  Eyes:    PERRL, conjunctiva/corneas  clear, EOM's intact       Lungs:     Clear to auscultation bilaterally, respirations unlabored  Heart:     Irregularly irregular rhythm. Normal rate   Neurologic:   Awake, alert, oriented x 3. No apparent focal neurological           defect.   Skin:  Marble sized non-tender, non-inflamed sebaceous cyst posterior scalp.        Assessment & Plan:     1. Essential hypertension Well controlled.  Continue current medications.    2. History of pituitary tumor   3. Atrial fibrillation, unspecified type ([place]) Asymptomatic. Compliant with medication.  Continue aggressive risk  factor modification.  Continue routine follow up Dr. Ubaldo Glassing.   4. Chronic obstructive pulmonary disease, unspecified COPD type ([place]) currently asymptomatic.   5. History of smoking 30 or more pack years Patient Instructions  Screening for lung cancer is recommended for people between 59 and 82 years of age who have smoked at 1 pack per day or more for at least 41 years. Please call our office at [phone] to schedule a low dose CT lung scan for lung cancer screening.    6. Need for influenza vaccination  - Flu Vaccine QUAD 36+ mos IM  7. Sebaceous cyst No treatment indicated at this time.   Return in about 6 months (around 10/26/2016).       Lelon Huh, MD  [facility]

## 2017-08-11 ENCOUNTER — Telehealth: Payer: Self-pay

## 2017-08-11 NOTE — Telephone Encounter (Signed)
Called pt to schedule AWV. Pts number on file was not working. Called sister [name]) that was listed under pts DPR and LM to CB. Need to get a working number for pt. -MM

## 2017-08-26 NOTE — Telephone Encounter (Signed)
Neither numbers on file work. Unable to contact pt. Closing TE. -MM

## 2017-09-19 DIAGNOSIS — I428 Other cardiomyopathies: Secondary | ICD-10-CM | POA: Diagnosis not present

## 2017-09-19 DIAGNOSIS — I10 Essential (primary) hypertension: Secondary | ICD-10-CM | POA: Diagnosis not present

## 2017-09-19 DIAGNOSIS — I480 Paroxysmal atrial fibrillation: Secondary | ICD-10-CM | POA: Diagnosis not present

## 2017-09-19 DIAGNOSIS — I4891 Unspecified atrial fibrillation: Secondary | ICD-10-CM | POA: Diagnosis not present

## 2017-12-02 ENCOUNTER — Telehealth: Payer: Self-pay | Admitting: Family Medicine

## 2017-12-02 NOTE — Telephone Encounter (Signed)
I called the pt to schedule AWV, but there was no answer and no option to leave a message. VDM (DD)

## 2017-12-08 NOTE — Telephone Encounter (Signed)
Please see previous TE. Pt has not been seen since 04/2016 and I was unable to contact her after several attempts in March this year.

## 2019-08-27 ENCOUNTER — Ambulatory Visit: Payer: Medicare Other | Attending: Internal Medicine

## 2019-08-27 DIAGNOSIS — Z23 Encounter for immunization: Secondary | ICD-10-CM

## 2019-08-27 NOTE — Progress Notes (Signed)
   Covid-19 Vaccination Clinic  Name:  Angelica Strickland    MRN: [id number] DOB: 06/11/1955  08/27/2019  Angelica Strickland was observed post Covid-19 immunization for 15 minutes without incident. She was provided with Vaccine Information Sheet and instruction to access the V-Safe system.   Angelica Strickland was instructed to call 911 with any severe reactions post vaccine: Marland Kitchen Difficulty breathing  . Swelling of face and throat  . A fast heartbeat  . A bad rash all over body  . Dizziness and weakness   Immunizations Administered    Name Date Dose VIS Date Route   Pfizer COVID-19 Vaccine 08/27/2019 10:27 AM 0.3 mL 05/04/2019 Intramuscular   Manufacturer: [place]   Lot: [id number]   [place]: [id number]

## 2019-09-17 ENCOUNTER — Ambulatory Visit: Payer: Medicare Other | Attending: Internal Medicine

## 2019-09-17 DIAGNOSIS — Z23 Encounter for immunization: Secondary | ICD-10-CM

## 2019-09-17 NOTE — Progress Notes (Signed)
   Covid-19 Vaccination Clinic  Name:  KOPELYNN BABEL    MRN: [id number] DOB: 1956/05/11  09/17/2019  Ms. Wess was observed post Covid-19 immunization for 15 minutes without incident. She was provided with Vaccine Information Sheet and instruction to access the V-Safe system.   Ms. Harvill was instructed to call 911 with any severe reactions post vaccine: Marland Kitchen Difficulty breathing  . Swelling of face and throat  . A fast heartbeat  . A bad rash all over body  . Dizziness and weakness   Immunizations Administered    Name Date Dose VIS Date Route   Pfizer COVID-19 Vaccine 09/17/2019 10:11 AM 0.3 mL 07/18/2018 Intramuscular   Manufacturer: [employer], [employer]   Lot: [id number]   [place]: [id number]

## 2020-10-14 DIAGNOSIS — I428 Other cardiomyopathies: Secondary | ICD-10-CM | POA: Diagnosis not present

## 2020-10-14 DIAGNOSIS — I10 Essential (primary) hypertension: Secondary | ICD-10-CM | POA: Diagnosis not present

## 2020-10-14 DIAGNOSIS — I4811 Longstanding persistent atrial fibrillation: Secondary | ICD-10-CM | POA: Diagnosis not present

## 2020-10-14 DIAGNOSIS — Z9889 Other specified postprocedural states: Secondary | ICD-10-CM | POA: Diagnosis not present

## 2020-10-14 DIAGNOSIS — I38 Endocarditis, valve unspecified: Secondary | ICD-10-CM | POA: Diagnosis not present

## 2020-10-17 ENCOUNTER — Other Ambulatory Visit: Payer: Self-pay

## 2020-10-17 ENCOUNTER — Inpatient Hospital Stay (HOSPITAL_COMMUNITY)
Admission: EM | Admit: 2020-10-17 | Discharge: 2020-11-01 | DRG: 064 | Disposition: A | Discharge status: Skilled Nursing Facility | Payer: Medicare Other | Attending: Internal Medicine | Admitting: Internal Medicine

## 2020-10-17 ENCOUNTER — Emergency Department (HOSPITAL_COMMUNITY): Payer: Medicare Other

## 2020-10-17 ENCOUNTER — Encounter (HOSPITAL_COMMUNITY): Payer: Self-pay | Admitting: Emergency Medicine

## 2020-10-17 DIAGNOSIS — I083 Combined rheumatic disorders of mitral, aortic and tricuspid valves: Secondary | ICD-10-CM | POA: Diagnosis present

## 2020-10-17 DIAGNOSIS — I5022 Chronic systolic (congestive) heart failure: Secondary | ICD-10-CM | POA: Diagnosis present

## 2020-10-17 DIAGNOSIS — I459 Conduction disorder, unspecified: Secondary | ICD-10-CM | POA: Diagnosis present

## 2020-10-17 DIAGNOSIS — R54 Age-related physical debility: Secondary | ICD-10-CM | POA: Diagnosis not present

## 2020-10-17 DIAGNOSIS — R0682 Tachypnea, not elsewhere classified: Secondary | ICD-10-CM | POA: Diagnosis not present

## 2020-10-17 DIAGNOSIS — G460 Middle cerebral artery syndrome: Secondary | ICD-10-CM | POA: Diagnosis present

## 2020-10-17 DIAGNOSIS — M1990 Unspecified osteoarthritis, unspecified site: Secondary | ICD-10-CM | POA: Diagnosis present

## 2020-10-17 DIAGNOSIS — I63512 Cerebral infarction due to unspecified occlusion or stenosis of left middle cerebral artery: Secondary | ICD-10-CM | POA: Diagnosis not present

## 2020-10-17 DIAGNOSIS — I10 Essential (primary) hypertension: Secondary | ICD-10-CM | POA: Diagnosis present

## 2020-10-17 DIAGNOSIS — Z20822 Contact with and (suspected) exposure to covid-19: Secondary | ICD-10-CM | POA: Diagnosis present

## 2020-10-17 DIAGNOSIS — I480 Paroxysmal atrial fibrillation: Secondary | ICD-10-CM | POA: Diagnosis present

## 2020-10-17 DIAGNOSIS — Z79899 Other long term (current) drug therapy: Secondary | ICD-10-CM

## 2020-10-17 DIAGNOSIS — I959 Hypotension, unspecified: Secondary | ICD-10-CM | POA: Diagnosis present

## 2020-10-17 DIAGNOSIS — D696 Thrombocytopenia, unspecified: Secondary | ICD-10-CM | POA: Diagnosis present

## 2020-10-17 DIAGNOSIS — Z87891 Personal history of nicotine dependence: Secondary | ICD-10-CM

## 2020-10-17 DIAGNOSIS — Z8249 Family history of ischemic heart disease and other diseases of the circulatory system: Secondary | ICD-10-CM

## 2020-10-17 DIAGNOSIS — I639 Cerebral infarction, unspecified: Secondary | ICD-10-CM | POA: Diagnosis present

## 2020-10-17 DIAGNOSIS — Z820 Family history of epilepsy and other diseases of the nervous system: Secondary | ICD-10-CM

## 2020-10-17 DIAGNOSIS — I4891 Unspecified atrial fibrillation: Secondary | ICD-10-CM | POA: Diagnosis present

## 2020-10-17 DIAGNOSIS — R29722 NIHSS score 22: Secondary | ICD-10-CM | POA: Diagnosis present

## 2020-10-17 DIAGNOSIS — G8191 Hemiplegia, unspecified affecting right dominant side: Secondary | ICD-10-CM | POA: Diagnosis present

## 2020-10-17 DIAGNOSIS — I130 Hypertensive heart and chronic kidney disease with heart failure and stage 1 through stage 4 chronic kidney disease, or unspecified chronic kidney disease: Secondary | ICD-10-CM | POA: Diagnosis present

## 2020-10-17 DIAGNOSIS — J449 Chronic obstructive pulmonary disease, unspecified: Secondary | ICD-10-CM | POA: Diagnosis present

## 2020-10-17 DIAGNOSIS — F32A Depression, unspecified: Secondary | ICD-10-CM | POA: Diagnosis present

## 2020-10-17 DIAGNOSIS — Z823 Family history of stroke: Secondary | ICD-10-CM

## 2020-10-17 DIAGNOSIS — Z7901 Long term (current) use of anticoagulants: Secondary | ICD-10-CM

## 2020-10-17 DIAGNOSIS — Z803 Family history of malignant neoplasm of breast: Secondary | ICD-10-CM

## 2020-10-17 DIAGNOSIS — R29810 Facial weakness: Secondary | ICD-10-CM | POA: Diagnosis not present

## 2020-10-17 DIAGNOSIS — Z8673 Personal history of transient ischemic attack (TIA), and cerebral infarction without residual deficits: Secondary | ICD-10-CM

## 2020-10-17 DIAGNOSIS — R404 Transient alteration of awareness: Secondary | ICD-10-CM | POA: Diagnosis not present

## 2020-10-17 DIAGNOSIS — I499 Cardiac arrhythmia, unspecified: Secondary | ICD-10-CM | POA: Diagnosis not present

## 2020-10-17 DIAGNOSIS — G936 Cerebral edema: Secondary | ICD-10-CM | POA: Diagnosis present

## 2020-10-17 DIAGNOSIS — Z888 Allergy status to other drugs, medicaments and biological substances status: Secondary | ICD-10-CM

## 2020-10-17 DIAGNOSIS — I63412 Cerebral infarction due to embolism of left middle cerebral artery: Secondary | ICD-10-CM | POA: Diagnosis not present

## 2020-10-17 DIAGNOSIS — N1831 Chronic kidney disease, stage 3a: Secondary | ICD-10-CM | POA: Diagnosis present

## 2020-10-17 DIAGNOSIS — Z7982 Long term (current) use of aspirin: Secondary | ICD-10-CM

## 2020-10-17 DIAGNOSIS — M47812 Spondylosis without myelopathy or radiculopathy, cervical region: Secondary | ICD-10-CM | POA: Diagnosis not present

## 2020-10-17 DIAGNOSIS — I509 Heart failure, unspecified: Secondary | ICD-10-CM

## 2020-10-17 DIAGNOSIS — I429 Cardiomyopathy, unspecified: Secondary | ICD-10-CM | POA: Diagnosis present

## 2020-10-17 DIAGNOSIS — R29818 Other symptoms and signs involving the nervous system: Secondary | ICD-10-CM | POA: Diagnosis present

## 2020-10-17 DIAGNOSIS — R58 Hemorrhage, not elsewhere classified: Secondary | ICD-10-CM | POA: Diagnosis not present

## 2020-10-17 DIAGNOSIS — D320 Benign neoplasm of cerebral meninges: Secondary | ICD-10-CM | POA: Diagnosis present

## 2020-10-17 DIAGNOSIS — Z6833 Body mass index (BMI) 33.0-33.9, adult: Secondary | ICD-10-CM

## 2020-10-17 DIAGNOSIS — R0600 Dyspnea, unspecified: Secondary | ICD-10-CM

## 2020-10-17 DIAGNOSIS — R6889 Other general symptoms and signs: Secondary | ICD-10-CM | POA: Diagnosis not present

## 2020-10-17 DIAGNOSIS — R4701 Aphasia: Secondary | ICD-10-CM | POA: Diagnosis present

## 2020-10-17 DIAGNOSIS — Z043 Encounter for examination and observation following other accident: Secondary | ICD-10-CM | POA: Diagnosis not present

## 2020-10-17 DIAGNOSIS — E669 Obesity, unspecified: Secondary | ICD-10-CM | POA: Diagnosis present

## 2020-10-17 DIAGNOSIS — Z743 Need for continuous supervision: Secondary | ICD-10-CM | POA: Diagnosis not present

## 2020-10-17 LAB — URINALYSIS, ROUTINE W REFLEX MICROSCOPIC
Bilirubin Urine: NEGATIVE
Glucose, UA: NEGATIVE mg/dL
Hgb urine dipstick: NEGATIVE
Ketones, ur: NEGATIVE mg/dL
Leukocytes,Ua: NEGATIVE
Nitrite: NEGATIVE
Protein, ur: NEGATIVE mg/dL
Specific Gravity, Urine: 1.015 (ref 1.005–1.030)
pH: 5 (ref 5.0–8.0)

## 2020-10-17 LAB — DIFFERENTIAL
Abs Immature Granulocytes: 0.04 10*3/uL (ref 0.00–0.07)
Basophils Absolute: 0 10*3/uL (ref 0.0–0.1)
Basophils Relative: 1 %
Eosinophils Absolute: 0.1 10*3/uL (ref 0.0–0.5)
Eosinophils Relative: 2 %
Immature Granulocytes: 1 %
Lymphocytes Relative: 23 %
Lymphs Abs: 1.7 10*3/uL (ref 0.7–4.0)
Monocytes Absolute: 0.5 10*3/uL (ref 0.1–1.0)
Monocytes Relative: 7 %
Neutro Abs: 4.9 10*3/uL (ref 1.7–7.7)
Neutrophils Relative %: 66 %

## 2020-10-17 LAB — CBC
HCT: 40.5 % (ref 36.0–46.0)
Hemoglobin: 13.4 g/dL (ref 12.0–15.0)
MCH: 32.8 pg (ref 26.0–34.0)
MCHC: 33.1 g/dL (ref 30.0–36.0)
MCV: 99 fL (ref 80.0–100.0)
Platelets: 144 10*3/uL — ABNORMAL LOW (ref 150–400)
RBC: 4.09 MIL/uL (ref 3.87–5.11)
RDW: 13.2 % (ref 11.5–15.5)
WBC: 7.3 10*3/uL (ref 4.0–10.5)
nRBC: 0 % (ref 0.0–0.2)

## 2020-10-17 LAB — PROTIME-INR
INR: 1.2 (ref 0.8–1.2)
Prothrombin Time: 15.6 seconds — ABNORMAL HIGH (ref 11.4–15.2)

## 2020-10-17 LAB — APTT: aPTT: 30 seconds (ref 24–36)

## 2020-10-17 NOTE — ED Provider Notes (Signed)
[facility] EMERGENCY DEPARTMENT Provider Note   CSN: [id number] Arrival date & time: 10/17/20  2320     History Chief Complaint  Patient presents with  . Weakness    Angelica Strickland is a 65 y.o. female.  HPI     This is a 65 year old female with a history of atrial fibrillation, congestive heart failure, COPD, hypertension who presents by EMS with altered mental status and focal neurologic deficits.  Per EMS, patient was last known normal at 10 PM yesterday evening approximately 25 hours ago.  She reportedly lives alone.  Her family was unable to get up with her all day and someone broke into her house tonight and found her lying on the floor.  EMS noted right-sided deficits, left gaze preference and aphasia.  Patient was found in her own urine.  Noted to be slightly hypotensive in route.  Otherwise her vital signs were stable.  12:41 AM Patient's sister now at the bedside.  She said she spoke to the patient yesterday at 5:30 PM.  Patient spouse recently died 2 months ago.  She was unable to get up with her sister today and found her tonight.    Past Medical History:  Diagnosis Date  . Atrial fibrillation ([place]) 11/11/2014  . Carpal tunnel syndrome 11/11/2014  . Congestive heart failure ([place]) 11/11/2014  . COPD (chronic obstructive pulmonary disease) ([place]) 11/11/2014  . Depression 11/11/2014  . Essential hypertension 11/11/2014  . History of pituitary tumor 11/11/2014  . Hypopotassemia 11/11/2014  . Osteoarthritis 11/11/2014  . Thrombocytopenia ([place]) 11/11/2014    Patient Active Problem List   Diagnosis Date Noted  . History of smoking 30 or more pack years 04/27/2016  . Body mass index (BMI) of 26.0-26.9 in adult 04/10/2015  . CCF (congestive cardiac failure) ([place]) 04/10/2015  . H/O neoplasm 04/10/2015  . Atrial fibrillation ([place]) 11/11/2014  . Depression 11/11/2014  . Essential hypertension 11/11/2014  . COPD (chronic obstructive pulmonary disease) ([place])  11/11/2014  . Osteoarthritis 11/11/2014  . Thrombocytopenia ([place]) 11/11/2014  . Carpal tunnel syndrome 11/11/2014  . History of pituitary tumor 11/11/2014  . Abnormal CAT scan 01/07/2012  . Cardiomyopathy ([place]) 01/07/2012  . 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 History of cardiac catheterization 01/07/2012    Past Surgical History:  Procedure Laterality Date  . CARDIAC CATHETERIZATION    . PITUITARY SURGERY    . UTERINE FIBROID SURGERY       OB History   No obstetric history on file.     Family History  Problem Relation Age of Onset  . Hypertension Mother   . Stroke Mother   . Alzheimer's disease Mother   . CAD Father   . Hypertension Father   . Healthy Sister   . Breast cancer Sister   . Healthy Sister     Social History   Tobacco Use  . Smoking status: Former Smoker    Quit date: 08/23/2010    Years since quitting: 10.1  . Smokeless tobacco: Never Used  . Tobacco comment: Previously <1 ppd for about 4 yearsl.   Substance Use Topics  . Alcohol use: No  . Drug use: No    Home Medications Prior to Admission medications   Medication Sig Start Date End Date Taking? Authorizing Provider  albuterol (PROVENTIL HFA;VENTOLIN HFA) 108 (90 Base) MCG/ACT inhaler Inhale 1 puff into the lungs every 6 (six) hours as needed for wheezing or shortness of breath. 02/27/16   Trinna Post, PA-C  aspirin EC 325 MG tablet Take by mouth.    [provider]  carvedilol (COREG) 12.5 MG tablet Take by mouth 2 (two) times daily with a meal.  01/26/15   [provider]  KLOR-CON M20 20 MEQ tablet TAKE ONE TABLET BY MOUTH ONCE DAILY Patient taking differently: Take 20 mEq by mouth daily. 12/26/15   Birdie Sons, MD  lisinopril (PRINIVIL,ZESTRIL) 2.5 MG tablet Take 2.5 mg by mouth daily.    [provider]  spironolactone (ALDACTONE) 25 MG tablet TAKE ONE TABLET BY MOUTH ONCE DAILY Patient taking differently: Take 25 mg by mouth daily. 04/21/16    Birdie Sons, MD  torsemide (DEMADEX) 20 MG tablet Take 1 tablet (20 mg total) by mouth daily. 04/27/16   Birdie Sons, MD    Allergies    Coumadin  [warfarin] and Clonidine derivatives  Review of Systems   Review of Systems  Unable to perform ROS: Acuity of condition    Physical Exam Updated Vital Signs BP 109/78   Pulse 83   Temp 98.1 F (36.7 C) (Rectal)   Resp (!) 21   SpO2 100%   Physical Exam Vitals and nursing note reviewed.  Constitutional:      Appearance: She is well-developed. She is ill-appearing. She is not toxic-appearing.  HENT:     Head: Normocephalic and atraumatic.     Nose: Nose normal.     Mouth/Throat:     Mouth: Mucous membranes are dry.  Eyes:     Pupils: Pupils are equal, round, and reactive to light.     Comments: Left pupil 8 mm and minimally responsive, right pupil 4 mm and responsive, left gaze preference with right-sided neglect  Cardiovascular:     Rate and Rhythm: Normal rate and regular rhythm.     Heart sounds: Normal heart sounds.  Pulmonary:     Effort: Pulmonary effort is normal. No respiratory distress.     Breath sounds: No wheezing.  Abdominal:     General: Bowel sounds are normal.     Palpations: Abdomen is soft.     Hernia: A hernia is present.     Comments: Incisional hernia noted, slight distention, soft  Musculoskeletal:     Cervical back: Neck supple.     Comments: Trace bilateral lower extremity edema  Skin:    General: Skin is warm and dry.  Neurological:     Mental Status: She is alert.     Comments: Opens eyes to voice, left gaze preference, right-sided neglect, rigid right-sided paralysis with contracture of the right upper extremity, flattening of the right nasolabial fold  Psychiatric:     Comments: Unable to assess     ED Results / Procedures / Treatments   Labs (all labs ordered are listed, but only abnormal results are displayed) Labs Reviewed  PROTIME-INR - Abnormal; Notable for the following  components:      Result Value   Prothrombin Time 15.6 (*)    All other components within normal limits  CBC - Abnormal; Notable for the following components:   Platelets 144 (*)    All other components within normal limits  COMPREHENSIVE METABOLIC PANEL - Abnormal; Notable for the following components:   Glucose, Bld 103 (*)    BUN 28 (*)    Creatinine, Ser 1.21 (*)    Total Bilirubin 1.3 (*)    GFR, Estimated 49 (*)    All other components within normal limits  RESP PANEL BY RT-PCR (FLU  A&B, COVID) ARPGX2  URINE CULTURE  ETHANOL  APTT  DIFFERENTIAL  RAPID URINE DRUG SCREEN, HOSP PERFORMED  URINALYSIS, ROUTINE W REFLEX MICROSCOPIC  LACTIC ACID, PLASMA  I-STAT CHEM 8, ED    EKG None  Radiology CT HEAD WO CONTRAST  Result Date: 10/18/2020 CLINICAL DATA:  Recent fall with right-sided facial droop and right-sided weakness, initial encounter EXAM: CT HEAD WITHOUT CONTRAST CT CERVICAL SPINE WITHOUT CONTRAST TECHNIQUE: Multidetector CT imaging of the head and cervical spine was performed following the standard protocol without intravenous contrast. Multiplanar CT image reconstructions of the cervical spine were also generated. COMPARISON:  09/20/2010 FINDINGS: CT HEAD FINDINGS Brain: Geographic area of decreased attenuation is identified in the distribution of the left middle cerebral artery consistent with acute to subacute infarct. This is new from the prior exam. Scattered cerebellar hypodensities are noted consistent with prior infarcts. No hemorrhage is seen. No abnormal mass is noted. Vascular: Mild hyperdensity is noted in the left middle cerebral artery likely representing thrombosis. Skull: Normal. Negative for fracture or focal lesion. Sinuses/Orbits: No acute finding. Other: None. CT CERVICAL SPINE FINDINGS Alignment: Within normal limits. Skull base and vertebrae: 7 cervical segments are well visualized. Vertebral body height is well maintained. Osteophytic changes are noted at  C5-6 and C6-7 with mild disc space narrowing. No significant facet hypertrophic changes are noted. No acute fracture is seen. Soft tissues and spinal canal: Surrounding soft tissue structures are within normal limits. Upper chest: Visualized lung apices are within normal limits. Other: None IMPRESSION: CT of the head: Evolving left MCA infarct with a hyperdense proximal left MCA. Old cerebellar infarcts. CT of the cervical spine: Degenerative change without acute abnormality. Critical Value/emergent results were called by telephone at the time of interpretation on 10/18/2020 at 12:01 am to Dr. Thayer Jew , who verbally acknowledged these results. Electronically Signed   By: Inez Catalina M.D.   On: 10/18/2020 00:02   CT CERVICAL SPINE WO CONTRAST  Result Date: 10/18/2020 CLINICAL DATA:  Recent fall with right-sided facial droop and right-sided weakness, initial encounter EXAM: CT HEAD WITHOUT CONTRAST CT CERVICAL SPINE WITHOUT CONTRAST TECHNIQUE: Multidetector CT imaging of the head and cervical spine was performed following the standard protocol without intravenous contrast. Multiplanar CT image reconstructions of the cervical spine were also generated. COMPARISON:  09/20/2010 FINDINGS: CT HEAD FINDINGS Brain: Geographic area of decreased attenuation is identified in the distribution of the left middle cerebral artery consistent with acute to subacute infarct. This is new from the prior exam. Scattered cerebellar hypodensities are noted consistent with prior infarcts. No hemorrhage is seen. No abnormal mass is noted. Vascular: Mild hyperdensity is noted in the left middle cerebral artery likely representing thrombosis. Skull: Normal. Negative for fracture or focal lesion. Sinuses/Orbits: No acute finding. Other: None. CT CERVICAL SPINE FINDINGS Alignment: Within normal limits. Skull base and vertebrae: 7 cervical segments are well visualized. Vertebral body height is well maintained. Osteophytic changes are  noted at C5-6 and C6-7 with mild disc space narrowing. No significant facet hypertrophic changes are noted. No acute fracture is seen. Soft tissues and spinal canal: Surrounding soft tissue structures are within normal limits. Upper chest: Visualized lung apices are within normal limits. Other: None IMPRESSION: CT of the head: Evolving left MCA infarct with a hyperdense proximal left MCA. Old cerebellar infarcts. CT of the cervical spine: Degenerative change without acute abnormality. Critical Value/emergent results were called by telephone at the time of interpretation on 10/18/2020 at 12:01 am to Dr. Thayer Jew ,  who verbally acknowledged these results. Electronically Signed   By: Inez Catalina M.D.   On: 10/18/2020 00:02    Procedures .Critical Care Performed by: Merryl Hacker, MD Authorized by: Merryl Hacker, MD   Critical care provider statement:    Critical care time (minutes):  45   Critical care was necessary to treat or prevent imminent or life-threatening deterioration of the following conditions:  CNS failure or compromise   Critical care was time spent personally by me on the following activities:  Discussions with consultants, evaluation of patient's response to treatment, examination of patient, ordering and performing treatments and interventions, ordering and review of laboratory studies, ordering and review of radiographic studies, pulse oximetry, re-evaluation of patient's condition, obtaining history from patient or surrogate and review of old charts     Medications Ordered in ED Medications - No data to display  ED Course  I have reviewed the triage vital signs and the nursing notes.  Pertinent labs & imaging results that were available during my care of the patient were reviewed by me and considered in my medical decision making (see chart for details).  Clinical Course as of 10/18/20 0041  Sat Oct 18, 2020  0010 Spoke with neurology.  Concern for completed  left MCA infarct.  She is greater than 24 hours from last seen normal and is out of both the tPA and interventional window.  Currently clinically stable.  Requesting hospitalist admission.  Neurology consult. [CH]    Clinical Course User Index [CH] Linwood Gullikson, Barbette Hair, MD   MDM Rules/Calculators/A&P                           Patient presents with focal neurologic deficits.  Greater than 24 hours from last being seen normal.  Sister confirms this timeframe.  She has physical exam finding concerning for a significant period of proximal insult likely left MCA.  Stroke order set used.  CT scan does show an evolving left MCA stroke.  Unfortunately, patient with a history of atrial fibrillation but not on anticoagulation because of thrombocytopenia.  Neurology was consulted.  Patient is afebrile and other work-up reviewed.  No significant metabolic derangements.  No leukocytosis.  Foley catheter was placed given acuity of condition and mobility issues.  We will plan for admission to the hospitalist.  Sister was updated at the bedside.  Final Clinical Impression(s) / ED Diagnoses Final diagnoses:  Acute ischemic left MCA stroke [facility])    Rx / DC Orders ED Discharge Orders    None       Dayelin Balducci, Barbette Hair, MD 10/18/20 [phone]

## 2020-10-17 NOTE — ED Triage Notes (Signed)
Pt BIB [facility] from home, LKW 10pm on May 26 (25 hours pta), family unable to get in touch with pt throughout the day. Found tonight on the floor, with right facial droop, right arm and right leg flaccid, and left gaze preference.

## 2020-10-18 ENCOUNTER — Inpatient Hospital Stay (HOSPITAL_COMMUNITY): Payer: Medicare Other

## 2020-10-18 ENCOUNTER — Encounter (HOSPITAL_COMMUNITY): Payer: Self-pay | Admitting: Internal Medicine

## 2020-10-18 DIAGNOSIS — Z7401 Bed confinement status: Secondary | ICD-10-CM | POA: Diagnosis not present

## 2020-10-18 DIAGNOSIS — J449 Chronic obstructive pulmonary disease, unspecified: Secondary | ICD-10-CM

## 2020-10-18 DIAGNOSIS — I517 Cardiomegaly: Secondary | ICD-10-CM | POA: Diagnosis not present

## 2020-10-18 DIAGNOSIS — M4802 Spinal stenosis, cervical region: Secondary | ICD-10-CM | POA: Diagnosis not present

## 2020-10-18 DIAGNOSIS — I6602 Occlusion and stenosis of left middle cerebral artery: Secondary | ICD-10-CM | POA: Diagnosis not present

## 2020-10-18 DIAGNOSIS — M1990 Unspecified osteoarthritis, unspecified site: Secondary | ICD-10-CM | POA: Diagnosis not present

## 2020-10-18 DIAGNOSIS — E669 Obesity, unspecified: Secondary | ICD-10-CM | POA: Diagnosis present

## 2020-10-18 DIAGNOSIS — R54 Age-related physical debility: Secondary | ICD-10-CM | POA: Diagnosis not present

## 2020-10-18 DIAGNOSIS — I63412 Cerebral infarction due to embolism of left middle cerebral artery: Secondary | ICD-10-CM | POA: Diagnosis not present

## 2020-10-18 DIAGNOSIS — D696 Thrombocytopenia, unspecified: Secondary | ICD-10-CM | POA: Diagnosis not present

## 2020-10-18 DIAGNOSIS — I480 Paroxysmal atrial fibrillation: Secondary | ICD-10-CM

## 2020-10-18 DIAGNOSIS — I69391 Dysphagia following cerebral infarction: Secondary | ICD-10-CM | POA: Diagnosis not present

## 2020-10-18 DIAGNOSIS — I63512 Cerebral infarction due to unspecified occlusion or stenosis of left middle cerebral artery: Secondary | ICD-10-CM

## 2020-10-18 DIAGNOSIS — Z8673 Personal history of transient ischemic attack (TIA), and cerebral infarction without residual deficits: Secondary | ICD-10-CM | POA: Diagnosis not present

## 2020-10-18 DIAGNOSIS — I10 Essential (primary) hypertension: Secondary | ICD-10-CM

## 2020-10-18 DIAGNOSIS — G8191 Hemiplegia, unspecified affecting right dominant side: Secondary | ICD-10-CM | POA: Diagnosis not present

## 2020-10-18 DIAGNOSIS — R29818 Other symptoms and signs involving the nervous system: Secondary | ICD-10-CM | POA: Diagnosis not present

## 2020-10-18 DIAGNOSIS — I5022 Chronic systolic (congestive) heart failure: Secondary | ICD-10-CM | POA: Diagnosis present

## 2020-10-18 DIAGNOSIS — I6389 Other cerebral infarction: Secondary | ICD-10-CM | POA: Diagnosis not present

## 2020-10-18 DIAGNOSIS — N1831 Chronic kidney disease, stage 3a: Secondary | ICD-10-CM | POA: Diagnosis not present

## 2020-10-18 DIAGNOSIS — I69351 Hemiplegia and hemiparesis following cerebral infarction affecting right dominant side: Secondary | ICD-10-CM | POA: Diagnosis not present

## 2020-10-18 DIAGNOSIS — M2578 Osteophyte, vertebrae: Secondary | ICD-10-CM | POA: Diagnosis not present

## 2020-10-18 DIAGNOSIS — Z20822 Contact with and (suspected) exposure to covid-19: Secondary | ICD-10-CM | POA: Diagnosis not present

## 2020-10-18 DIAGNOSIS — I959 Hypotension, unspecified: Secondary | ICD-10-CM | POA: Diagnosis not present

## 2020-10-18 DIAGNOSIS — S14109A Unspecified injury at unspecified level of cervical spinal cord, initial encounter: Secondary | ICD-10-CM | POA: Diagnosis not present

## 2020-10-18 DIAGNOSIS — G319 Degenerative disease of nervous system, unspecified: Secondary | ICD-10-CM | POA: Diagnosis not present

## 2020-10-18 DIAGNOSIS — I083 Combined rheumatic disorders of mitral, aortic and tricuspid valves: Secondary | ICD-10-CM | POA: Diagnosis not present

## 2020-10-18 DIAGNOSIS — N183 Chronic kidney disease, stage 3 unspecified: Secondary | ICD-10-CM | POA: Diagnosis not present

## 2020-10-18 DIAGNOSIS — J90 Pleural effusion, not elsewhere classified: Secondary | ICD-10-CM | POA: Diagnosis not present

## 2020-10-18 DIAGNOSIS — I639 Cerebral infarction, unspecified: Secondary | ICD-10-CM | POA: Diagnosis not present

## 2020-10-18 DIAGNOSIS — R0600 Dyspnea, unspecified: Secondary | ICD-10-CM | POA: Diagnosis not present

## 2020-10-18 DIAGNOSIS — I6503 Occlusion and stenosis of bilateral vertebral arteries: Secondary | ICD-10-CM | POA: Diagnosis not present

## 2020-10-18 DIAGNOSIS — G459 Transient cerebral ischemic attack, unspecified: Secondary | ICD-10-CM | POA: Diagnosis not present

## 2020-10-18 DIAGNOSIS — I6523 Occlusion and stenosis of bilateral carotid arteries: Secondary | ICD-10-CM | POA: Diagnosis not present

## 2020-10-18 DIAGNOSIS — Z7901 Long term (current) use of anticoagulants: Secondary | ICD-10-CM | POA: Diagnosis not present

## 2020-10-18 DIAGNOSIS — I130 Hypertensive heart and chronic kidney disease with heart failure and stage 1 through stage 4 chronic kidney disease, or unspecified chronic kidney disease: Secondary | ICD-10-CM | POA: Diagnosis not present

## 2020-10-18 DIAGNOSIS — I429 Cardiomyopathy, unspecified: Secondary | ICD-10-CM | POA: Diagnosis not present

## 2020-10-18 DIAGNOSIS — R0602 Shortness of breath: Secondary | ICD-10-CM | POA: Diagnosis not present

## 2020-10-18 DIAGNOSIS — I708 Atherosclerosis of other arteries: Secondary | ICD-10-CM | POA: Diagnosis not present

## 2020-10-18 DIAGNOSIS — R29810 Facial weakness: Secondary | ICD-10-CM | POA: Diagnosis not present

## 2020-10-18 DIAGNOSIS — M47813 Spondylosis without myelopathy or radiculopathy, cervicothoracic region: Secondary | ICD-10-CM | POA: Diagnosis not present

## 2020-10-18 DIAGNOSIS — R531 Weakness: Secondary | ICD-10-CM | POA: Diagnosis not present

## 2020-10-18 DIAGNOSIS — I509 Heart failure, unspecified: Secondary | ICD-10-CM | POA: Diagnosis not present

## 2020-10-18 DIAGNOSIS — I672 Cerebral atherosclerosis: Secondary | ICD-10-CM | POA: Diagnosis not present

## 2020-10-18 DIAGNOSIS — M47812 Spondylosis without myelopathy or radiculopathy, cervical region: Secondary | ICD-10-CM | POA: Diagnosis not present

## 2020-10-18 DIAGNOSIS — R4701 Aphasia: Secondary | ICD-10-CM | POA: Diagnosis not present

## 2020-10-18 DIAGNOSIS — Z6833 Body mass index (BMI) 33.0-33.9, adult: Secondary | ICD-10-CM | POA: Diagnosis not present

## 2020-10-18 DIAGNOSIS — Z8249 Family history of ischemic heart disease and other diseases of the circulatory system: Secondary | ICD-10-CM | POA: Diagnosis not present

## 2020-10-18 DIAGNOSIS — G936 Cerebral edema: Secondary | ICD-10-CM | POA: Diagnosis not present

## 2020-10-18 DIAGNOSIS — G460 Middle cerebral artery syndrome: Secondary | ICD-10-CM | POA: Diagnosis not present

## 2020-10-18 DIAGNOSIS — Z743 Need for continuous supervision: Secondary | ICD-10-CM | POA: Diagnosis not present

## 2020-10-18 DIAGNOSIS — I110 Hypertensive heart disease with heart failure: Secondary | ICD-10-CM | POA: Diagnosis not present

## 2020-10-18 DIAGNOSIS — F32A Depression, unspecified: Secondary | ICD-10-CM | POA: Diagnosis not present

## 2020-10-18 DIAGNOSIS — I69320 Aphasia following cerebral infarction: Secondary | ICD-10-CM | POA: Diagnosis not present

## 2020-10-18 LAB — COMPREHENSIVE METABOLIC PANEL
ALT: 10 U/L (ref 0–44)
AST: 17 U/L (ref 15–41)
Albumin: 3.8 g/dL (ref 3.5–5.0)
Alkaline Phosphatase: 51 U/L (ref 38–126)
Anion gap: 8 (ref 5–15)
BUN: 28 mg/dL — ABNORMAL HIGH (ref 8–23)
CO2: 23 mmol/L (ref 22–32)
Calcium: 9.2 mg/dL (ref 8.9–10.3)
Chloride: 104 mmol/L (ref 98–111)
Creatinine, Ser: 1.21 mg/dL — ABNORMAL HIGH (ref 0.44–1.00)
GFR, Estimated: 49 mL/min — ABNORMAL LOW (ref >60)
Glucose, Bld: 103 mg/dL — ABNORMAL HIGH (ref 70–99)
Potassium: 4.4 mmol/L (ref 3.5–5.1)
Sodium: 135 mmol/L (ref 135–145)
Total Bilirubin: 1.3 mg/dL — ABNORMAL HIGH (ref 0.3–1.2)
Total Protein: 6.7 g/dL (ref 6.5–8.1)

## 2020-10-18 LAB — CBC
HCT: 38.7 % (ref 36.0–46.0)
Hemoglobin: 12.8 g/dL (ref 12.0–15.0)
MCH: 32.7 pg (ref 26.0–34.0)
MCHC: 33.1 g/dL (ref 30.0–36.0)
MCV: 98.7 fL (ref 80.0–100.0)
Platelets: 130 10*3/uL — ABNORMAL LOW (ref 150–400)
RBC: 3.92 MIL/uL (ref 3.87–5.11)
RDW: 13.2 % (ref 11.5–15.5)
WBC: 7.1 10*3/uL (ref 4.0–10.5)
nRBC: 0 % (ref 0.0–0.2)

## 2020-10-18 LAB — BASIC METABOLIC PANEL
Anion gap: 7 (ref 5–15)
BUN: 25 mg/dL — ABNORMAL HIGH (ref 8–23)
CO2: 23 mmol/L (ref 22–32)
Calcium: 8.9 mg/dL (ref 8.9–10.3)
Chloride: 108 mmol/L (ref 98–111)
Creatinine, Ser: 1.1 mg/dL — ABNORMAL HIGH (ref 0.44–1.00)
GFR, Estimated: 55 mL/min — ABNORMAL LOW (ref >60)
Glucose, Bld: 89 mg/dL (ref 70–99)
Potassium: 4.3 mmol/L (ref 3.5–5.1)
Sodium: 138 mmol/L (ref 135–145)

## 2020-10-18 LAB — RESP PANEL BY RT-PCR (FLU A&B, COVID) ARPGX2
Influenza A by PCR: NEGATIVE
Influenza B by PCR: NEGATIVE
SARS Coronavirus 2 by RT PCR: NEGATIVE

## 2020-10-18 LAB — MAGNESIUM: Magnesium: 2 mg/dL (ref 1.7–2.4)

## 2020-10-18 LAB — RAPID URINE DRUG SCREEN, HOSP PERFORMED
Amphetamines: NOT DETECTED
Barbiturates: NOT DETECTED
Benzodiazepines: NOT DETECTED
Cocaine: NOT DETECTED
Opiates: NOT DETECTED
Tetrahydrocannabinol: NOT DETECTED

## 2020-10-18 LAB — LIPID PANEL
Cholesterol: 166 mg/dL (ref 0–200)
HDL: 43 mg/dL (ref >40)
LDL Cholesterol: 110 mg/dL — ABNORMAL HIGH (ref 0–99)
Total CHOL/HDL Ratio: 3.9 RATIO
Triglycerides: 66 mg/dL (ref <150)
VLDL: 13 mg/dL (ref 0–40)

## 2020-10-18 LAB — ETHANOL: Alcohol, Ethyl (B): <10 mg/dL (ref <10)

## 2020-10-18 LAB — LACTIC ACID, PLASMA: Lactic Acid, Venous: 1.7 mmol/L (ref 0.5–1.9)

## 2020-10-18 LAB — HIV ANTIBODY (ROUTINE TESTING W REFLEX): HIV Screen 4th Generation wRfx: NONREACTIVE

## 2020-10-18 MED ORDER — SODIUM CHLORIDE 0.9 % IV SOLN: INTRAVENOUS | Status: DC

## 2020-10-18 MED ORDER — ASPIRIN 300 MG RE SUPP
300.0000 mg | Freq: Every day | RECTAL | Status: DC
  Filled 2020-10-18: qty 1

## 2020-10-18 MED ORDER — ALBUTEROL SULFATE (2.5 MG/3ML) 0.083% IN NEBU
2.5000 mg | INHALATION_SOLUTION | RESPIRATORY_TRACT | Status: DC | PRN
  Administered 2020-10-25 – 2020-10-31 (×8): 2.5 mg via RESPIRATORY_TRACT
  Filled 2020-10-18 (×8): qty 3

## 2020-10-18 MED ORDER — CHLORHEXIDINE GLUCONATE CLOTH 2 % EX PADS
6.0000 | MEDICATED_PAD | Freq: Every day | CUTANEOUS | Status: DC
  Administered 2020-10-20 – 2020-10-30 (×10): 6 via TOPICAL

## 2020-10-18 MED ORDER — ASPIRIN 81 MG PO CHEW: 81.0000 mg | CHEWABLE_TABLET | Freq: Every day | ORAL | Status: DC

## 2020-10-18 MED ORDER — CLOPIDOGREL BISULFATE 75 MG PO TABS: 75.0000 mg | ORAL_TABLET | Freq: Every day | ORAL | Status: DC

## 2020-10-18 MED ORDER — ASPIRIN 81 MG PO CHEW
324.0000 mg | CHEWABLE_TABLET | Freq: Every day | ORAL | Status: DC
  Administered 2020-10-19 – 2020-10-22 (×4): 324 mg via ORAL
  Filled 2020-10-18 (×5): qty 4

## 2020-10-18 MED ORDER — ENOXAPARIN SODIUM 40 MG/0.4ML IJ SOSY
40.0000 mg | PREFILLED_SYRINGE | Freq: Every day | INTRAMUSCULAR | Status: DC
  Administered 2020-10-19 – 2020-10-21 (×4): 40 mg via SUBCUTANEOUS
  Filled 2020-10-18 (×4): qty 0.4

## 2020-10-18 MED ORDER — IOHEXOL 350 MG/ML SOLN
50.0000 mL | Freq: Once | INTRAVENOUS | Status: AC | PRN
  Administered 2020-10-18: 50 mL via INTRAVENOUS

## 2020-10-18 MED ORDER — FOOD THICKENER (SIMPLYTHICK)
25.0000 | ORAL | Status: DC | PRN
  Filled 2020-10-18: qty 25

## 2020-10-18 MED ORDER — ASPIRIN 300 MG RE SUPP
300.0000 mg | Freq: Every day | RECTAL | Status: DC
  Administered 2020-10-18: 300 mg via RECTAL
  Filled 2020-10-18: qty 1

## 2020-10-18 MED ORDER — STROKE: EARLY STAGES OF RECOVERY BOOK
Freq: Once | Status: DC
  Filled 2020-10-18: qty 1

## 2020-10-18 NOTE — Progress Notes (Signed)
Inpatient Rehab Admissions Coordinator Note:   Per therapy recommendations, pt was screened for CIR candidacy by Clemens Catholic, [place] CCC-SLP. At this time, Pt. Is tolerating only bed level activities with therapy and appears unable to tolerate CIR level therapies. I will hold off on placing CIR consult for now, but [facility] team will follow and monitor for progress and participation with therapy.  Will place order for rehab consult per protocol.  Please contact me with questions.   Clemens Catholic, [place], [place] Admissions Coordinator  [phone] ([place]) [phone] (office)

## 2020-10-18 NOTE — Consult Note (Signed)
NEUROLOGY CONSULTATION NOTE   Date of service: Oct 18, 2020 Patient Name: Angelica Strickland MRN:  [id number] DOB:  1956/04/17 Reason for consult: "L MCA stroke" Requesting Provider: Merryl Hacker, MD _ _ _   _ __   _ __ _ _  __ __   _ __   __ _  History of Present Illness  Angelica Strickland is a 65 y.o. female with PMH significant for Afibb not on AC due to thrombocytopenia, hx of COPD, HTN, depression, Osteoarthritis who was found on the floor with side facial droop, R arm and leg weakness and a left gaze preference and aphasia.  Sister at bedside reports that she spoke to patient last at 1400 on 10/17/20 over the phone. No one was able to get in touch with her afterwards. They went in the evening to check on her, no response on the door, they kicked the door and found her on the floor. EMS called and she was brought in to the hospital.  [facility] w/o contrast with a evolving left MCA infarct with a hyperdense proximal left MCA. Old cerebellar infarcts.  MRS: 0 TPA/Thrombectomy: Hypodense L MCA Infarct appears completed on CT Head. No salvagable tissue, unlikely to benefit from thrombectomy. NIHSS components Score: Comment  1a Level of Conscious 0[x]  1[]  2[]  3[]      1b LOC Questions 0[]  1[]  2[x]       1c LOC Commands 0[]  1[]  2[x]       2 Best Gaze 0[]  1[]  2[x]       3 Visual 0[]  1[x]  2[]  3[]      4 Facial Palsy 0[]  1[]  2[x]  3[]      5a Motor Arm - left 0[x]  1[]  2[]  3[]  4[]  UN[]    5b Motor Arm - Right 0[]  1[]  2[]  3[x]  4[]  UN[]    6a Motor Leg - Left 0[]  1[x]  2[]  3[]  4[]  UN[]    6b Motor Leg - Right 0[]  1[]  2[x]  3[]  4[]  UN[]    7 Limb Ataxia 0[x]  1[]  2[]  3[]  UN[]     8 Sensory 0[]  1[x]  2[]  UN[]      9 Best Language 0[]  1[]  2[]  3[x]      10 Dysarthria 0[]  1[x]  2[]  UN[]      11 Extinct. and Inattention 0[]  1[]  2[x]       TOTAL: 22      ROS  Unable to obtain due to global aphasia. Past History   Past Medical History:  Diagnosis Date  . Atrial fibrillation ([place]) 11/11/2014  . Carpal tunnel syndrome  11/11/2014  . Congestive heart failure ([place]) 11/11/2014  . COPD (chronic obstructive pulmonary disease) ([place]) 11/11/2014  . Depression 11/11/2014  . Essential hypertension 11/11/2014  . History of pituitary tumor 11/11/2014  . Hypopotassemia 11/11/2014  . Osteoarthritis 11/11/2014  . Thrombocytopenia ([place]) 11/11/2014   Past Surgical History:  Procedure Laterality Date  . CARDIAC CATHETERIZATION    . PITUITARY SURGERY    . UTERINE FIBROID SURGERY     Family History  Problem Relation Age of Onset  . Hypertension Mother   . Stroke Mother   . Alzheimer's disease Mother   . CAD Father   . Hypertension Father   . Healthy Sister   . Breast cancer Sister   . Healthy Sister    Social History   Socioeconomic History  . Marital status: Single    Spouse name: Not on file  . Number of children: Not on file  . Years of education: Not on file  . Highest education level: Not on file  Occupational History  . Not on file  Tobacco Use  . Smoking status: Former Smoker    Quit date: 08/23/2010    Years since quitting: 10.1  . Smokeless tobacco: Never Used  . Tobacco comment: Previously <1 ppd for about 4 yearsl.   Substance and Sexual Activity  . Alcohol use: No  . Drug use: No  . Sexual activity: Not on file  Other Topics Concern  . Not on file  Social History Narrative  . Not on file   Social Determinants of Health   Financial Resource Strain: Not on file  Food Insecurity: Not on file  Transportation Needs: Not on file  Physical Activity: Not on file  Stress: Not on file  Social Connections: Not on file   Allergies  Allergen Reactions  . 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 Clonidine Derivatives Rash    Medications  (Not in a hospital admission)    Vitals   Vitals:   10/17/20 2326 10/17/20 2343  BP: 112/71   Pulse: 93   Resp: 17   Temp:  98.1 F (36.7 C)  TempSrc:  Rectal  SpO2: 94%      There is no height or weight  on file to calculate BMI.  Physical Exam   General: Laying comfortably in bed; in no acute distress. C collar in place. HENT: Normal oropharynx and mucosa. Normal external appearance of ears and nose. Neck: Supple, no pain or tenderness CV: No JVD. No peripheral edema. Pulmonary: Symmetric Chest rise. Normal respiratory effort. Abdomen: Soft to touch, non-tender. Ext: No cyanosis, edema, or deformity Skin: No rash. Normal palpation of skin.  Musculoskeletal: Normal digits and nails by inspection. No clubbing.  Neurologic Examination  Mental status/Cognition: opens eyes to voice, tracks face on the left. Does not cross midline.  Speech/language: mute, global aphasia. Cranial nerves:   CN II Pupils equal and reactive to light, does not blink to threat on the right.   CN III,IV,VI L gaze deviation, no nystagmus.   CN V    CN VII R Facial droop   CN VIII    CN IX & X    CN XI    CN XII    Motor/sensory:  Muscle bulk: normal, tone flaccid in RUE Unable to do detailed strength testing secondary to aphasia.  Minimal movement in RUE Holds LUE up off the bed for more than 10 secs. Spontaneously moves BL lower extremities but moves LLE more than RLE. Withdraws BL lower extremities to pain. Localizes in LUE.  Reflexes:  Right Left Comments  Pectoralis      Biceps (C5/6) 1 1   Brachioradialis (C5/6) 1 1    Triceps (C6/7) 1 1    Patellar (L3/4) 1 1    Achilles (S1)      Hoffman      Plantar     Jaw jerk    Coordination/Complex Motor:  Unable to assess but no obvious ataxia or tremors noted in LUE.  Labs   CBC:  Recent Labs  Lab 10/17/20 2322  WBC 7.3  NEUTROABS 4.9  HGB 13.4  HCT 40.5  MCV 99.0  PLT 144*    Basic Metabolic Panel:  Lab Results  Component Value Date   NA 144 10/21/2015   K 4.2 10/21/2015   CO2 26 10/21/2015   GLUCOSE 82 10/21/2015   BUN 19 10/21/2015   CREATININE 0.86 10/21/2015   CALCIUM 9.7 10/21/2015  GFRNONAA 73 10/21/2015   GFRAA 84  10/21/2015   Lipid Panel:  Lab Results  Component Value Date   [place] 10/21/2015   HgbA1c: No results found for: HGBA1C Urine Drug Screen:     Component Value Date/Time   LABOPIA NEGATIVE 03/25/2012 0238   COCAINSCRNUR NEGATIVE 03/25/2012 0238   LABBENZ NEGATIVE 03/25/2012 0238   AMPHETMU NEGATIVE 03/25/2012 0238   THCU NEGATIVE 03/25/2012 0238   LABBARB NEGATIVE 03/25/2012 0238    Alcohol Level No results found for: [facility]  CT Head without contrast: Acute L MCA stroke with hyperdense proximal L MCA.  MRI Brain: pending  Impression   Jalaiyah L Fehrenbach is a 65 y.o. female with PMH significant for Afibb not on AC due to thrombocytopenia, hx of COPD, HTN, depression, Osteoarthritis who presents with a L MCA stroke. Outside tPA window and stroke appears completed on CT Head and low benefit from thrombectomy. Her neurologic examination is notable for R sided weakness and aphasia with L gaze preference.  Primary Diagnosis:  Cerebral infarction due to embolism of  left middle cerebral artery.   Secondary Diagnosis: Cerebral edema, Essential (primary) hypertension, Paroxysmal atrial fibrillation and Obesity  Recommendations   Plan:  - Frequent Neuro checks per stroke unit protocol - Recommend brain imaging with MRI Brain without contrast - Recommend Vascular imaging with MRA Angio Head without contrast and US Carotid doppler - Recommend obtaining TTE - Recommend obtaining Lipid panel with LDL - Please start statin if LDL > 70 - Recommend HbA1c - Antithrombotic - Aspirin 81mg  daily along with plavix 75mg  daily for now. Might still be a candidate for [facility] in the future. Will need to weigh risks and benefits. - Recommend DVT ppx - SBP goal - permissive hypertension first 24 h < 220/110. Held home meds.  - Recommend Telemetry monitoring for arrythmia - Recommend bedside swallow screen prior to PO intake. - Stroke education booklet - Recommend PT/OT/SLP  consult   ______________________________________________________________________   Thank you for the opportunity to take part in the care of this patient. If you have any further questions, please contact the neurology consultation attending.  Signed,  [place] Pager Number [phone] _ _ _   _ __   _ __ _ _  __ __   _ __   __ _

## 2020-10-18 NOTE — Plan of Care (Signed)
  Problem: Clinical Measurements: Goal: Ability to maintain clinical measurements within normal limits will improve Outcome: Progressing Goal: Will remain free from infection Outcome: Progressing Goal: Diagnostic test results will improve Outcome: Progressing Goal: Respiratory complications will improve Outcome: Progressing Goal: Cardiovascular complication will be avoided Outcome: Progressing   Problem: Nutrition: Goal: Adequate nutrition will be maintained Outcome: Progressing   Problem: Coping: Goal: Level of anxiety will decrease Outcome: Progressing   Problem: Pain Managment: Goal: General experience of comfort will improve Outcome: Progressing   Problem: Safety: Goal: Ability to remain free from injury will improve Outcome: Progressing   Problem: Skin Integrity: Goal: Risk for impaired skin integrity will decrease Outcome: Progressing   Problem: Self-Care: Goal: Ability to participate in self-care as condition permits will improve Outcome: Progressing   Problem: Nutrition: Goal: Risk of aspiration will decrease Outcome: Progressing Goal: Dietary intake will improve Outcome: Progressing

## 2020-10-18 NOTE — Progress Notes (Signed)
PROGRESS NOTE    Angelica Strickland  SMO:[id number] DOB: 09/14/1955 DOA: 10/17/2020 PCP: Birdie Sons, MD   Brief Narrative:  HPI On 10/18/2020 by Dr. Babs Bertin Angelica Strickland is a 65 y.o. female with medical history significant for chronic systolic heart failure with most recent echocardiogram in May 2015 showing LVEF less than 20%, paroxysmal atrial fibrillation not on chronic anticoagulation in the setting of thrombocytopenia, hypertension, COPD, who is admitted to [facility] on 10/17/2020 with acute ischemic stroke after presenting from home to [facility] ED via EMS for evaluation of right facial droop and right-sided weakness.  In the context of the patient's current expressive aphasia, the following history of the patient's history, addition to my discussions with the emergency department physician and via chart review.  Per the patient's sister, she conveys that she most recently spoke to the patient over the phone at approximately 1400 on 10/17/2020, at which time the patient was in her normal state of health, and without acute complaint.  A few hours later, patient's sister attempted to contact the patient again, who was unable to reach her, prompting family members to go to the patient's, and upon no response to their knocking on the door, were able to gain entrance into the house at which time they found the patient laying on the floor, with evidence of presenting weakness as well as expressive aphasia.  At that time, they contacted EMS who brought patient over to [facility] emergency department for further evaluation of the above, with patient noted to arrive at [facility] ED on 2320 on 10/17/2020.  In terms of modifiable CV risk factors, per chart review, it appears that the patient has a history of hypertension, for which she is prescribed Coreg as well as lisinopril.  She also has a history of paroxysmal atrial fibrillation for which she is not chronically anticoagulated in the setting of  a documented history of thrombocytopenia.  Rather, she has been taking a daily full dose aspirin at home, but no additional blood thinning agents.  No known history of underlying hyperlipidemia or diabetes.  Pressure review, she is a former smoker, having completely quit smoking approximately 10 years ago after smoking less than 1 pack/day for 4 to 5 years.  No known history of underlying obstructive sleep apnea.  Not on any lipid-lowering medications as an outpatient, including no statin.   Per chart review, most recent echocardiogram was performed in May 2015 and showed LVEF less than 20%, moderately dilated left ventricular cavity size, mildly dilated left atrium, moderately dilated right atrium, moderate to severe mitral regurgitation, moderate to severe tricuspid regurgitation.  Patient's home diuretic regimen consists of torsemide 20 mg p.o. daily.   Interim history Admitted with CVA. Work up in progress.  Assessment & Plan   Acute ischemic CVA -Patient presented with right facial droop and right-sided weakness as well as expressive aphasia -CT head showed evolving left MCA infarct with hyperdense proximal left MCA -CTA head and neck showed left M1 occlusion, no flow-limiting stenosis or embolic source seen in the atheromatous cervical carotids.  High-grade atheromatous narrowing at the right vertebral origin. -MRI brain: Moderately large acute to early subacute left MCA distribution infarct, associated mild petechial hemorrhage without frank hemorrhagic transformation.  No significant mass-effect. -MRA head showed occlusion of the proximal mid left M1 segment mild atherosclerotic change elsewhere within the intracranial circulation with no hemodynamically significant or correctable stenosis. -LDL 110, hemoglobin A1c pending -Continue aspirin, will need statin once passes  swallow evaluation -PT and OT, speech therapy consulted -Neurology consulted and appreciated  Essential  hypertension -Given acute CVA, allow for permissive hypertension-treat systolic blood pressures of greater than 102 or diastolic pressures are greater than 110  COPD -Currently stable, continue albuterol as needed  Chronic systolic heart failure -Last echocardiogram in May 2015 shows an EF of less than 20% -Currently appears to be euvolemic and compensated -Lisinopril and Coreg, torsemide held for now to allow for permissive hypertension -Repeat echocardiogram pending  Paroxysmal atrial fibrillation -Currently irregular irregular -CHA2DS2-VASc of 6 -Patient not on any anticoagulation given history of thrombocytopenia  Thrombocytopenia -Chronic, platelets currently stable at 130, continue to monitor CBC  Chronic kidney disease, stage IIIa -Creatinine stable, continue to monitor BMP  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   Difficult to place patient No   Consultants Neurology   Procedures  None  Antibiotics   Anti-infectives (From admission, onward)   None      Subjective:   Angelica Strickland seen and examined today.  Minimally verbal and interactive.  Objective:   Vitals:   10/18/20 0115 10/18/20 0300 10/18/20 0430 10/18/20 0730  BP: 108/65 113/84 102/60 107/64  Pulse: 90 95 80 84  Resp: 18 (!) 23 (!) 22 (!) 23  Temp:      TempSrc:      SpO2: 100% 96% 100% 99%    Intake/Output Summary (Last 24 hours) at 10/18/2020 1033 Last data filed at 10/18/2020 5852 Gross  per 24 hour  Intake --  Output 200 ml  Net -200 ml   There were no vitals filed for this visit.  Exam  General: Well developed, well nourished, NAD, appears stated age  HEENT: NCAT, mucous membranes moist.   Neck: Cervical neck collar  Cardiovascular: S1 S2 auscultated, irregular, 2/6 SEM.  Respiratory: Clear to auscultation bilaterally  Abdomen: Soft, nontender, nondistended, + bowel sounds  Extremities: warm dry without cyanosis clubbing or edema  Neuro: Awake and alert, however cannot assess orientation at this time.  Has spontaneous movement of the lower extremities more on the left than the right.  Right upper extremity flaccid tone.  Right facial droop. Left gaze deviation  Skin: Without rashes exudates or nodules  Psych: cannot assess at this time   Data Reviewed: I have personally reviewed following labs and imaging studies  CBC: Recent Labs  Lab 10/17/20 2322 10/18/20 0554  WBC 7.3 7.1  NEUTROABS 4.9  --   HGB 13.4 12.8  HCT 40.5 38.7  MCV 99.0 98.7  PLT 144* 778*   Basic Metabolic Panel: Recent Labs  Lab 10/17/20 2322 10/18/20 0554  NA 135 138  K 4.4 4.3  CL 104 108  CO2 23 23  GLUCOSE 103* 89  BUN 28* 25*  CREATININE 1.21* 1.10*  CALCIUM 9.2 8.9  MG  --  2.0   GFR: CrCl cannot be calculated (Unknown ideal  weight.). Liver Function Tests: Recent Labs  Lab 10/17/20 2322  AST 17  ALT 10  ALKPHOS 51  BILITOT 1.3*  PROT 6.7  ALBUMIN 3.8   No results for input(s): LIPASE, AMYLASE in the last 168 hours. No results for input(s): AMMONIA in the last 168 hours. Coagulation Profile: Recent Labs  Lab 10/17/20 2322  INR 1.2   Cardiac Enzymes: No results for input(s): CKTOTAL, CKMB, CKMBINDEX, TROPONINI in the last 168 hours. BNP (last 3 results) No results for input(s): PROBNP in the last 8760 hours. HbA1C: No results for input(s): HGBA1C in the last 72 hours. CBG: No results for input(s): GLUCAP in the last 168 hours. Lipid  Profile: Recent Labs    10/18/20 0554  CHOL 166  HDL 43  LDLCALC 110*  TRIG 66  CHOLHDL 3.9   Thyroid Function Tests: No results for input(s): TSH, T4TOTAL, FREET4, T3FREE, THYROIDAB in the last 72 hours. Anemia Panel: No results for input(s): VITAMINB12, FOLATE, FERRITIN, TIBC, IRON, RETICCTPCT in the last 72 hours. Urine analysis:    Component Value Date/Time   COLORURINE YELLOW 10/17/2020 2324   APPEARANCEUR CLEAR 10/17/2020 2324   APPEARANCEUR Clear 10/11/2013 1747   LABSPEC 1.015 10/17/2020 2324   LABSPEC 1.008 10/11/2013 1747   PHURINE 5.0 10/17/2020 2324   GLUCOSEU NEGATIVE 10/17/2020 2324   GLUCOSEU Negative 10/11/2013 1747   HGBUR NEGATIVE 10/17/2020 2324   BILIRUBINUR NEGATIVE 10/17/2020 2324   BILIRUBINUR Negative 10/11/2013 1747   KETONESUR NEGATIVE 10/17/2020 2324   PROTEINUR NEGATIVE 10/17/2020 2324   NITRITE NEGATIVE 10/17/2020 2324   LEUKOCYTESUR NEGATIVE 10/17/2020 2324   LEUKOCYTESUR 2+ 10/11/2013 1747   Sepsis Labs: @LABRCNTIP (procalcitonin:4,lacticidven:4)  ) Recent Results (from the past 240 hour(s))  Resp Panel by RT-PCR (Flu A&B, Covid) Urine, Catheterized     Status: None   Collection Time: 10/17/20 11:24 PM   Specimen: Urine, Catheterized; Nasopharyngeal(NP) swabs in vial transport medium  Result Value Ref Range Status   SARS Coronavirus 2 by RT PCR NEGATIVE NEGATIVE Final    Comment: (NOTE) SARS-CoV-2 target nucleic acids are NOT DETECTED.  The SARS-CoV-2 RNA is generally detectable in upper respiratory specimens during the acute phase of infection. The lowest concentration of SARS-CoV-2 viral copies this assay can detect is 138 copies/mL. A negative result does not preclude SARS-Cov-2 infection and should not be used as the sole basis for treatment or other patient management decisions. A negative result may occur with  improper specimen collection/handling, submission of specimen other than nasopharyngeal swab, presence of viral  mutation(s) within the areas targeted by this assay, and inadequate number of viral copies(<138 copies/mL). A negative result must be combined with clinical observations, patient history, and epidemiological information. The expected result is Negative.  Fact Sheet for Patients:  EntrepreneurPulse.com.au  Fact Sheet for Healthcare Providers:  IncredibleEmployment.be  This test is no t yet approved or cleared by the [place] FDA and  has been authorized for detection and/or diagnosis of SARS-CoV-2 by FDA under an Emergency Use Authorization (EUA). This EUA will remain  in effect (meaning this test can be used) for the duration of the COVID-19 declaration under Section 564(b)(1) of the Act, 21 U.S.C.section 360bbb-3(b)(1), unless the authorization is terminated  or revoked sooner.       Influenza A by PCR NEGATIVE NEGATIVE Final   Influenza B by PCR NEGATIVE NEGATIVE Final    Comment: (NOTE) The Xpert Xpress SARS-CoV-2/FLU/RSV plus assay is intended as an aid in the diagnosis of influenza from Nasopharyngeal swab  specimens and should not be used as a sole basis for treatment. Nasal washings and aspirates are unacceptable for Xpert Xpress SARS-CoV-2/FLU/RSV testing.  Fact Sheet for Patients: EntrepreneurPulse.com.au  Fact Sheet for Healthcare Providers: IncredibleEmployment.be  This test is not yet approved or cleared by the [place] FDA and has been authorized for detection and/or diagnosis of SARS-CoV-2 by FDA under an Emergency Use Authorization (EUA). This EUA will remain in effect (meaning this test can be used) for the duration of the COVID-19 declaration under Section 564(b)(1) of the Act, 21 U.S.C. section 360bbb-3(b)(1), unless the authorization is terminated or revoked.  Performed at [facility], [place] [address], [place], [place] [address]       Radiology Studies: CT ANGIO  HEAD NECK W WO CM  Result Date: 10/18/2020 CLINICAL DATA:  Stroke follow-up EXAM: CT ANGIOGRAPHY HEAD AND NECK TECHNIQUE: Multidetector CT imaging of the head and neck was performed using the standard protocol during bolus administration of intravenous contrast. Multiplanar CT image reconstructions and MIPs were obtained to evaluate the vascular anatomy. Carotid stenosis measurements (when applicable) are obtained utilizing NASCET criteria, using the distal internal carotid diameter as the denominator. CONTRAST:  58mL OMNIPAQUE IOHEXOL 350 MG/ML SOLN COMPARISON:  Brain MRI from earlier the same day FINDINGS: CTA NECK FINDINGS Aortic arch: Atheromatous plaque.  Two vessel branching. Right carotid system: Mainly low-density plaque at the bifurcation without ulceration or flow limiting stenosis. Left carotid system: Mainly low-density plaque at the distal common carotid without flow limiting stenosis or ulceration. Vertebral arteries: Proximal subclavian atherosclerosis without flow limiting stenosis. Mixed density plaque at the right V1 segment causes high-grade narrowing. No vertebral dissection or beading. Skeleton: Mandibular cavity and periapical erosion on the right Other neck: Thyroid atrophy. Upper chest: Partial coverage of large main pulmonary artery measuring at least 3.8 cm, consistent with pulmonary hypertension. Review of the MIP images confirms the above findings CTA HEAD FINDINGS Anterior circulation: Atheromatous plaque along the carotid siphons. Left M1 occlusion with limited reconstitution in the arterial phase. Fenestrated appearance to the right M1 segment. Negative for aneurysm. Generalized atheromatous changes to medium size vessels. Posterior circulation: Atheromatous calcification involving the right vertebral artery. No PCA occlusion or flow limiting stenosis. Fetal type right PCA flow. Venous sinuses: Unremarkable in the arterial phase Anatomic variants: As above High-density along the  planum sphenoidale which appears dural based, up to 4 mm in thickness and 14 mm in diameter. There is hyperostosis of the underlying bone. Review of the MIP images confirms the above findings IMPRESSION: 1. Left M1 occlusion. 2. No flow limiting stenosis or embolic source seen in the atheromatous cervical carotids. 3. High-grade atheromatous narrowing at the right vertebral origin. 4. 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 After convalescence a postcontrast brain MRI can be obtained. Electronically Signed   By: Monte Fantasia M.D.   On: 10/18/2020 07:02   CT HEAD WO CONTRAST  Result Date: 10/18/2020 CLINICAL DATA:  Recent fall with right-sided facial droop and right-sided weakness, initial encounter EXAM: CT HEAD WITHOUT CONTRAST CT CERVICAL SPINE WITHOUT CONTRAST TECHNIQUE: Multidetector CT imaging of the head and cervical spine was performed following the standard protocol without intravenous contrast. Multiplanar CT image reconstructions of the cervical spine were also generated. COMPARISON:  09/20/2010 FINDINGS: CT HEAD FINDINGS Brain: Geographic area of decreased attenuation is identified in the distribution of the left middle cerebral artery consistent with acute to subacute infarct. This is new from the prior exam. Scattered cerebellar hypodensities are noted consistent with  prior infarcts. No hemorrhage is seen. No abnormal mass is noted. Vascular: Mild hyperdensity is noted in the left middle cerebral artery likely representing thrombosis. Skull: Normal. Negative for fracture or focal lesion. Sinuses/Orbits: No acute finding. Other: None. CT CERVICAL SPINE FINDINGS Alignment: Within normal limits. Skull base and vertebrae: 7 cervical segments are well visualized. Vertebral body height is well maintained. Osteophytic changes are noted at C5-6 and C6-7 with mild disc space narrowing. No significant facet hypertrophic changes are noted. No acute fracture is seen. Soft tissues and spinal canal:  Surrounding soft tissue structures are within normal limits. Upper chest: Visualized lung apices are within normal limits. Other: None IMPRESSION: CT of the head: Evolving left MCA infarct with a hyperdense proximal left MCA. Old cerebellar infarcts. CT of the cervical spine: Degenerative change without acute abnormality. Critical Value/emergent results were called by telephone at the time of interpretation on 10/18/2020 at 12:01 am to Dr. Thayer Jew , who verbally acknowledged these results. Electronically Signed   By: Inez Catalina M.D.   On: 10/18/2020 00:02   CT CERVICAL SPINE WO CONTRAST  Result Date: 10/18/2020 CLINICAL DATA:  Recent fall with right-sided facial droop and right-sided weakness, initial encounter EXAM: CT HEAD WITHOUT CONTRAST CT CERVICAL SPINE WITHOUT CONTRAST TECHNIQUE: Multidetector CT imaging of the head and cervical spine was performed following the standard protocol without intravenous contrast. Multiplanar CT image reconstructions of the cervical spine were also generated. COMPARISON:  09/20/2010 FINDINGS: CT HEAD FINDINGS Brain: Geographic area of decreased attenuation is identified in the distribution of the left middle cerebral artery consistent with acute to subacute infarct. This is new from the prior exam. Scattered cerebellar hypodensities are noted consistent with prior infarcts. No hemorrhage is seen. No abnormal mass is noted. Vascular: Mild hyperdensity is noted in the left middle cerebral artery likely representing thrombosis. Skull: Normal. Negative for fracture or focal lesion. Sinuses/Orbits: No acute finding. Other: None. CT CERVICAL SPINE FINDINGS Alignment: Within normal limits. Skull base and vertebrae: 7 cervical segments are well visualized. Vertebral body height is well maintained. Osteophytic changes are noted at C5-6 and C6-7 with mild disc space narrowing. No significant facet hypertrophic changes are noted. No acute fracture is seen. Soft tissues and  spinal canal: Surrounding soft tissue structures are within normal limits. Upper chest: Visualized lung apices are within normal limits. Other: None IMPRESSION: CT of the head: Evolving left MCA infarct with a hyperdense proximal left MCA. Old cerebellar infarcts. CT of the cervical spine: Degenerative change without acute abnormality. Critical Value/emergent results were called by telephone at the time of interpretation on 10/18/2020 at 12:01 am to Dr. Thayer Jew , who verbally acknowledged these results. Electronically Signed   By: Inez Catalina M.D.   On: 10/18/2020 00:02   MR ANGIO HEAD WO CONTRAST  Result Date: 10/18/2020 CLINICAL DATA:  Initial evaluation for acute stroke. EXAM: MRI HEAD WITHOUT CONTRAST MRA HEAD WITHOUT CONTRAST TECHNIQUE: Multiplanar, multi-echo pulse sequences of the brain and surrounding structures were acquired without intravenous contrast. Angiographic images of the Circle of Willis were acquired using MRA technique without intravenous contrast. COMPARISON: No pertinent prior exam. COMPARISON:  Prior CT from 10/17/2020 FINDINGS: MRI HEAD FINDINGS Brain: Examination mildly degraded by motion artifact. Diffuse prominence of the CSF containing spaces compatible generalized age-related cerebral atrophy. Patchy and confluent T2/FLAIR hyperintensity within the periventricular and deep white matter both cerebral hemispheres most consistent with chronic small vessel ischemic disease. Remote lacunar infarct at the anterior genu of the corpus callosum.  Few remote lacunar infarcts about the pons. Multiple bilateral cerebellar infarcts noted, right greater than left. Moderately large confluent area of restricted diffusion involving the left frontal and temporal regions consistent with an acute to early subacute left MCA distribution infarct. Involvement of the left insula, overlying left frontal operculum, left basal ganglia, and mid-posterior left temporal lobe. Mild petechial hemorrhage at  the level of the left lentiform nucleus without frank hemorrhagic transformation. Minimal effacement of the left lateral ventricle without significant regional mass effect or midline shift at this time. No other evidence for acute or subacute ischemia. Gray-white matter differentiation otherwise maintained. No other evidence for acute or chronic intracranial hemorrhage. No mass lesion. No hydrocephalus or extra-axial fluid collection. No made of an empty sella. Midline structures intact. Vascular: Attenuated flow within the left MCA distribution. Major intracranial vascular flow voids are otherwise maintained. Skull and upper cervical spine: Craniocervical junction within normal limits. Bone marrow signal intensity within normal limits. Mild edema noted at the scalp vertex. Sinuses/Orbits: Globes and orbital soft tissues demonstrate no acute finding. Mild scattered mucosal thickening noted within the ethmoidal air cells and maxillary sinuses. Paranasal sinuses are otherwise clear. Small right mastoid effusion noted, of doubtful significance. Inner ear structures grossly normal. Other: None. MRA HEAD FINDINGS ANTERIOR CIRCULATION: Examination mildly degraded by motion artifact. Distal cervical segments of the internal carotid arteries are patent with antegrade flow. Petrous segments patent bilaterally. Mild atheromatous irregularity within the carotid siphons without hemodynamically significant stenosis. A1 segments patent bilaterally. Normal anterior communicating artery complex. Anterior cerebral arteries patent to their distal aspects without stenosis. There is occlusion of the proximal-mid left M1 segment. A small temporal branch remains perfused. Otherwise, no significant flow seen within the left MCA distribution by MRA. Right M1 segment patent without stenosis. Right MCA bifurcation grossly within normal limits, although evaluation somewhat limited by motion. Distal right MCA branches well perfused. POSTERIOR  CIRCULATION: Both V4 segments patent to the vertebrobasilar junction without stenosis. Right vertebral artery slightly dominant. Right PICA patent. Left PICA not seen. Basilar widely patent proximally. Short-segment mild stenosis at the distal basilar artery noted (series 1036, image 14). Superior cerebellar arteries patent bilaterally. Left PCA supplied via the basilar as well as a small left posterior communicating artery. Predominant fetal type origin of the right PCA. Both PCAs well perfused or distal aspects without stenosis. No aneurysm. IMPRESSION: MRI HEAD IMPRESSION: 1. Moderately large acute to early subacute left MCA distribution infarct as above. Associated mild petechial hemorrhage without frank hemorrhagic transformation. No significant mass effect at this time. 2. Underlying age-related cerebral atrophy with chronic small vessel ischemic disease with multiple remote bilateral cerebellar infarcts. MRA HEAD IMPRESSION: 1. Occlusion of the proximal-mid left M1 segment. A small temporal branch remains perfused. Otherwise, no significant flow within the left MCA distribution by MRA. 2. Mild atherosclerotic change elsewhere within the intracranial circulation with no other hemodynamically significant or correctable stenosis. Electronically Signed   By: Jeannine Boga M.D.   On: 10/18/2020 03:01   MR BRAIN WO CONTRAST  Result Date: 10/18/2020 CLINICAL DATA:  Initial evaluation for acute stroke. EXAM: MRI HEAD WITHOUT CONTRAST MRA HEAD WITHOUT CONTRAST TECHNIQUE: Multiplanar, multi-echo pulse sequences of the brain and surrounding structures were acquired without intravenous contrast. Angiographic images of the Circle of Willis were acquired using MRA technique without intravenous contrast. COMPARISON: No pertinent prior exam. COMPARISON:  Prior CT from 10/17/2020 FINDINGS: MRI HEAD FINDINGS Brain: Examination mildly degraded by motion artifact. Diffuse prominence of the CSF containing  spaces  compatible generalized age-related cerebral atrophy. Patchy and confluent T2/FLAIR hyperintensity within the periventricular and deep white matter both cerebral hemispheres most consistent with chronic small vessel ischemic disease. Remote lacunar infarct at the anterior genu of the corpus callosum. Few remote lacunar infarcts about the pons. Multiple bilateral cerebellar infarcts noted, right greater than left. Moderately large confluent area of restricted diffusion involving the left frontal and temporal regions consistent with an acute to early subacute left MCA distribution infarct. Involvement of the left insula, overlying left frontal operculum, left basal ganglia, and mid-posterior left temporal lobe. Mild petechial hemorrhage at the level of the left lentiform nucleus without frank hemorrhagic transformation. Minimal effacement of the left lateral ventricle without significant regional mass effect or midline shift at this time. No other evidence for acute or subacute ischemia. Gray-white matter differentiation otherwise maintained. No other evidence for acute or chronic intracranial hemorrhage. No mass lesion. No hydrocephalus or extra-axial fluid collection. No made of an empty sella. Midline structures intact. Vascular: Attenuated flow within the left MCA distribution. Major intracranial vascular flow voids are otherwise maintained. Skull and upper cervical spine: Craniocervical junction within normal limits. Bone marrow signal intensity within normal limits. Mild edema noted at the scalp vertex. Sinuses/Orbits: Globes and orbital soft tissues demonstrate no acute finding. Mild scattered mucosal thickening noted within the ethmoidal air cells and maxillary sinuses. Paranasal sinuses are otherwise clear. Small right mastoid effusion noted, of doubtful significance. Inner ear structures grossly normal. Other: None. MRA HEAD FINDINGS ANTERIOR CIRCULATION: Examination mildly degraded by motion artifact. Distal  cervical segments of the internal carotid arteries are patent with antegrade flow. Petrous segments patent bilaterally. Mild atheromatous irregularity within the carotid siphons without hemodynamically significant stenosis. A1 segments patent bilaterally. Normal anterior communicating artery complex. Anterior cerebral arteries patent to their distal aspects without stenosis. There is occlusion of the proximal-mid left M1 segment. A small temporal branch remains perfused. Otherwise, no significant flow seen within the left MCA distribution by MRA. Right M1 segment patent without stenosis. Right MCA bifurcation grossly within normal limits, although evaluation somewhat limited by motion. Distal right MCA branches well perfused. POSTERIOR CIRCULATION: Both V4 segments patent to the vertebrobasilar junction without stenosis. Right vertebral artery slightly dominant. Right PICA patent. Left PICA not seen. Basilar widely patent proximally. Short-segment mild stenosis at the distal basilar artery noted (series 1036, image 14). Superior cerebellar arteries patent bilaterally. Left PCA supplied via the basilar as well as a small left posterior communicating artery. Predominant fetal type origin of the right PCA. Both PCAs well perfused or distal aspects without stenosis. No aneurysm. IMPRESSION: MRI HEAD IMPRESSION: 1. Moderately large acute to early subacute left MCA distribution infarct as above. Associated mild petechial hemorrhage without frank hemorrhagic transformation. No significant mass effect at this time. 2. Underlying age-related cerebral atrophy with chronic small vessel ischemic disease with multiple remote bilateral cerebellar infarcts. MRA HEAD IMPRESSION: 1. Occlusion of the proximal-mid left M1 segment. A small temporal branch remains perfused. Otherwise, no significant flow within the left MCA distribution by MRA. 2. Mild atherosclerotic change elsewhere within the intracranial circulation with no other  hemodynamically significant or correctable stenosis. Electronically Signed   By: Jeannine Boga M.D.   On: 10/18/2020 03:01     Scheduled Meds: .  stroke: mapping our early stages of recovery book   Does not apply Once  . [START ON 10/19/2020] aspirin  324 mg Oral Daily   Or  . [START ON 10/19/2020] aspirin  300 mg  Rectal Daily   Continuous Infusions: . sodium chloride 75 mL/hr at 10/18/20 0928     LOS: 0 days   Time Spent in minutes   45 minutes  Angelica Strickland D.O. on 10/18/2020 at 10:33 AM  Between 7am to 7pm - Please see pager noted on [email or web address]  After 7pm go to [email or web address]  And look for the night coverage person covering for me after hours  [facility] Office  [phone]

## 2020-10-18 NOTE — Evaluation (Addendum)
Speech Language Pathology Evaluation Patient Details Name: Angelica Strickland MRN: [id number] DOB: September 18, 1955 Today's Date: 10/18/2020 Time: 4818-5631 SLP Time Calculation (min) (ACUTE ONLY): 35 min  Problem List:  Patient Active Problem List   Diagnosis Date Noted  . Acute ischemic stroke ([place]) 10/18/2020  . Chronic systolic CHF (congestive heart failure) ([place]) 10/18/2020  . History of smoking 30 or more pack years 04/27/2016  . Body mass index (BMI) of 26.0-26.9 in adult 04/10/2015  . CCF (congestive cardiac failure) ([place]) 04/10/2015  . H/O neoplasm 04/10/2015  . Atrial fibrillation ([place]) 11/11/2014  . Depression 11/11/2014  . Essential hypertension 11/11/2014  . COPD (chronic obstructive pulmonary disease) ([place]) 11/11/2014  . Osteoarthritis 11/11/2014  . Thrombocytopenia ([place]) 11/11/2014  . Carpal tunnel syndrome 11/11/2014  . History of pituitary tumor 11/11/2014  . Abnormal CAT scan 01/07/2012  . Cardiomyopathy ([place]) 01/07/2012  . 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 History of cardiac catheterization 01/07/2012   Past Medical History:  Past Medical History:  Diagnosis Date  . Atrial fibrillation ([place]) 11/11/2014  . Carpal tunnel syndrome 11/11/2014  . Congestive heart failure ([place]) 11/11/2014  . COPD (chronic obstructive pulmonary disease) ([place]) 11/11/2014  . Depression 11/11/2014  . Essential hypertension 11/11/2014  . History of pituitary tumor 11/11/2014  . Hypopotassemia 11/11/2014  . Osteoarthritis 11/11/2014  . Thrombocytopenia ([place]) 11/11/2014   Past Surgical History:  Past Surgical History:  Procedure Laterality Date  . CARDIAC CATHETERIZATION    . PITUITARY SURGERY    . UTERINE FIBROID SURGERY     HPI:  Pt is a 65 yo adm to [facility] after recent fall, new onset of right side weakness, facial asymmetery and aphasia.  Imaging of brain showed moderately large acute to subacute CVA due to Left M1 occlusion.  Pt also with remote bilateral cerebellar cvas.  Cervical  spine imaging showed osteophytic changes C5-C6, C6-C7.  PMH + for pituitary tumor, COPD, CHF, smoking.  .  Pt did not pass [facility].   Assessment / Plan / Recommendation Clinical Impression  Pt presents with global aphasia, motor planning deficits and dysarthria.  She demonstrates strengths of mimicing some basic visual commands (protruding tongue, raising hand).  Stereotypical utterance of "yeah" t/o session was not meaningful to questions - as with verbal choice of two (water, tea) - pt did not verbalize.  Nor did she point to correct object with verbal choice of two.  Automatic speech tasks of singing was completed with 80% accuracy with visual/verbal cues but pt did not verbalize counting with 6 attempts.  Identifying name from choice of two *written* was not successful *names in large print.  Pt will benefit from skilled SLP to maximize receptive/expressive language/speech for basic communication - yes/no basic questions, requesting basic needs - to decrease caregiver burden. No family present to educate to findings/recommendations.    SLP Assessment  SLP Recommendation/Assessment: Patient needs continued Speech Lanaguage Pathology Services SLP Visit Diagnosis: Aphasia (R47.01)    Follow Up Recommendations  Skilled Nursing facility    Frequency and Duration min 2x/week  2 weeks      SLP Evaluation Cognition  Overall Cognitive Status: Difficult to assess Arousal/Alertness: Awake/alert Orientation Level: Other (comment) (UTA severe aphasia) Attention: Sustained Sustained Attention: Impaired Sustained Attention Impairment: Functional basic Awareness: Impaired Awareness Impairment:  (? some awareness to aphasia) Problem Solving: Appears intact (for basic self feeding - holding spoon - reaching for straw when moved away from her)       Comprehension  Auditory Comprehension  Overall Auditory Comprehension: Impaired Yes/No Questions: Impaired Basic Biographical Questions: 0-25%  accurate Basic Immediate Environment Questions: Not tested Commands: Impaired (pt able to mimic but no follow basic directions without visual cues) Other Conversation Comments: stereotypical utterance of "yeah" observed t/o session, not appropriate; pt did repeat word x1 during session Visual Recognition/Discrimination Discrimination: Not tested Reading Comprehension Reading Status:  (pt did not point to her name with total cue and choice of 2)    Expression Verbal Expression Overall Verbal Expression: Impaired Initiation: Impaired Automatic Speech: Singing;Counting (singing attempted approximatley 60% of opportunities, 0% with counting) Repetition: Impaired Level of Impairment: Word level (repeated one single word during session only) Naming: Impairment Other Naming Comments: did not name objects or self - motor planning deficits Pragmatics: Unable to assess Written Expression Written Expression: Not tested   Oral / Motor  Oral Motor/Sensory Function Overall Oral Motor/Sensory Function: Moderate impairment Facial ROM: Reduced right Facial Symmetry: Abnormal symmetry right Facial Strength: Reduced right Lingual ROM: Reduced right Lingual Strength: Reduced Lingual Sensation: Reduced Velum:  (DNT, difficult to view) Mandible: Other (Comment) (pt in cervical collar, did not test) Motor Speech Overall Motor Speech: Impaired Respiration: Impaired Phonation: Low vocal intensity Motor Planning: Impaired Level of Impairment: Word Motor Speech Errors: Unaware   GO                    [name] 10/18/2020, 10:24 AM  Kathleen Lime, MS [facility] SLP Acute Rehab Services Office [phone] Pager [phone]

## 2020-10-18 NOTE — H&P (Signed)
History and Physical    PLEASE NOTE THAT DRAGON DICTATION SOFTWARE WAS USED IN THE CONSTRUCTION OF THIS NOTE.   Angelica Strickland AVW:[id number] DOB: 01-18-1956 DOA: 10/17/2020  PCP: Birdie Sons, MD Patient coming from: home   I have personally briefly reviewed patient's old medical records in [place]  Chief Complaint: Right facial droop and right-sided weakness  HPI: Angelica Strickland is a 65 y.o. female with medical history significant for chronic systolic heart failure with most recent echocardiogram in May 2015 showing LVEF less than 20%, paroxysmal atrial fibrillation not on chronic anticoagulation in the setting of thrombocytopenia, hypertension, COPD, who is admitted to [facility] on 10/17/2020 with acute ischemic stroke after presenting from home to [facility] ED via EMS for evaluation of right facial droop and right-sided weakness.  In the context of the patient's current expressive aphasia, the following history of the patient's history, addition to my discussions with the emergency department physician and via chart review.  Per the patient's sister, she conveys that she most recently spoke to the patient over the phone at approximately 1400 on 10/17/2020, at which time the patient was in her normal state of health, and without acute complaint.  A few hours later, patient's sister attempted to contact the patient again, who was unable to reach her, prompting family members to go to the patient's, and upon no response to their knocking on the door, were able to gain entrance into the house at which time they found the patient laying on the floor, with evidence of presenting weakness as well as expressive aphasia.  At that time, they contacted EMS who brought patient over to [facility] emergency department for further evaluation of the above, with patient noted to arrive at [facility] ED on 2320 on 10/17/2020.  In terms of modifiable CV risk factors, per chart review, it appears that the  patient has a history of hypertension, for which she is prescribed Coreg as well as lisinopril.  She also has a history of paroxysmal atrial fibrillation for which she is not chronically anticoagulated in the setting of a documented history of thrombocytopenia.  Rather, she has been taking a daily full dose aspirin at home, but no additional blood thinning agents.  No known history of underlying hyperlipidemia or diabetes.  Pressure review, she is a former smoker, having completely quit smoking approximately 10 years ago after smoking less than 1 pack/day for 4 to 5 years.  No known history of underlying obstructive sleep apnea.  Not on any lipid-lowering medications as an outpatient, including no statin.   Per chart review, most recent echocardiogram was performed in May 2015 and showed LVEF less than 20%, moderately dilated left ventricular cavity size, mildly dilated left atrium, moderately dilated right atrium, moderate to severe mitral regurgitation, moderate to severe tricuspid regurgitation.  Patient's home diuretic regimen consists of torsemide 20 mg p.o. daily.      ED Course:  Vital signs in the ED were notable for the following: Temperature max 98.1, heart rate 77-93, blood pressure 103/79 113/84; respiratory rate 18-23, oxygen saturation-96 to 100% on room air.  Labs were notable for the following: CMP was notable for the following: Sodium 135, potassium 4.4, creatinine 1.21 relative to most recent prior creatinine data point of 1.3 on 10/14/2020, glucose 103, and liver enzymes were found to be within normal limits.  CBC notable for white blood cell count of 7300.  INR 1.2.  Serum ethanol less than 10.  Urinary  drug screen was pan negative.  Nasopharyngeal COVID-19/influenza PCR were checked in the emergency department this evening and found to be negative.  EKG showed atrial fibrillation with multiple PVCs and ventricular rate of 86, with nonspecific intraventricular conduction delay, QTC  508, and no evidence of T wave or ST changes, including no evidence of ST elevation.  Noncontrast CT of the head showed evidence of acute left MCA infarct as well as old cerebellar infarcts.  Patient's case and imaging were discussed with the on-call neurologist, Dr. Lorrin Goodell, who reviewed this imaging and evaluated patient in person in the emergency department, noting right facial droop, right hemiparesis with flaccid tone in right upper extremity, expressive aphasia,.  Per his review of presenting CT head, Dr. Lorrin Goodell found that this imaging indicated completed left MCA infarct, with no evidence of salvageable tissue, with the patient therefore unlikely to benefit from thrombectomy, and also confirmed that the patient is not a candidate for tPA.  Rather, Dr. Lorrin Goodell recommended admission to the hospital service for evaluation and management of acute ischemic stroke further imaging in the MRI brain, MRA head, carotid ultrasound, as well as further assessment of modifiable acute ischemic CVA risk factors, including checking lipid panel as well as hemoglobin A1c, with recommendation to initiate a statin if LDL found to be greater than 70.  Additionally, recommends initiation of dual antiplatelet therapy with daily baby aspirin as well as Plavix 75 mg p.o. daily, while also observing permissive hypertension for 24 hours from last known normal.   While in the ED, the following were administered: Aspirin 300 mg PR x 1.      Review of Systems: As per HPI otherwise 10 point review of systems negative.   Past Medical History:  Diagnosis Date  . Atrial fibrillation ([place]) 11/11/2014  . Carpal tunnel syndrome 11/11/2014  . Congestive heart failure ([place]) 11/11/2014  . COPD (chronic obstructive pulmonary disease) ([place]) 11/11/2014  . Depression 11/11/2014  . Essential hypertension 11/11/2014  . History of pituitary tumor 11/11/2014  . Hypopotassemia 11/11/2014  . Osteoarthritis 11/11/2014  . Thrombocytopenia  ([place]) 11/11/2014    Past Surgical History:  Procedure Laterality Date  . CARDIAC CATHETERIZATION    . PITUITARY SURGERY    . UTERINE FIBROID SURGERY      Social History:  reports that she quit smoking about 10 years ago. She has never used smokeless tobacco. She reports that she does not drink alcohol and does not use drugs.   Allergies  Allergen Reactions  . 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 Clonidine Derivatives Rash    Family History  Problem Relation Age of Onset  . Hypertension Mother   . Stroke Mother   . Alzheimer's disease Mother   . CAD Father   . Hypertension Father   . Healthy Sister   . Breast cancer Sister   . Healthy Sister     Prior to Admission medications   Medication Sig Start Date End Date Taking? Authorizing Provider  albuterol (PROVENTIL HFA;VENTOLIN HFA) 108 (90 Base) MCG/ACT inhaler Inhale 1 puff into the lungs every 6 (six) hours as needed for wheezing or shortness of breath. 02/27/16   Trinna Post, PA-C  aspirin EC 325 MG tablet Take by mouth.    [provider]  carvedilol (COREG) 12.5 MG tablet Take by mouth 2 (two) times daily with a meal.  01/26/15   [provider]  KLOR-CON M20 20 MEQ tablet  TAKE ONE TABLET BY MOUTH ONCE DAILY Patient not taking: Reported on 10/18/2020 12/26/15   Birdie Sons, MD  lisinopril (PRINIVIL,ZESTRIL) 2.5 MG tablet Take 2.5 mg by mouth daily.    [provider]  spironolactone (ALDACTONE) 25 MG tablet TAKE ONE TABLET BY MOUTH ONCE DAILY Patient taking differently: Take 25 mg by mouth daily. 04/21/16   Birdie Sons, MD  torsemide (DEMADEX) 20 MG tablet Take 1 tablet (20 mg total) by mouth daily. 04/27/16   Birdie Sons, MD     Objective    Physical Exam: Vitals:   10/17/20 2343 10/18/20 0000 10/18/20 0015 10/18/20 0030  BP:  103/79 103/85 109/78  Pulse:  77  83  Resp:  19 20 (!) 21  Temp: 98.1 F (36.7 C)     TempSrc:  Rectal     SpO2:  100% 99% 100%    General: appears to be stated age; alert;  Skin: warm, dry, no rash Head:  AT/[place] Mouth:  Oral mucosa membranes appear moist, normal dentition Neck: supple; trachea midline Heart: Irregular, but rate controlled; 2/6 systolic murmur noted Lungs: CTAB, did not appreciate any wheezes, rales, or rhonchi Abdomen: + BS; soft, ND Vascular: 2+ pedal pulses b/l; 2+ radial pulses b/l Extremities: no peripheral edema, no muscle wasting Neuro: Spontaneous movement bilateral lower extremities, with more movement noted on left than right; spontaneous movement of left upper extremity, flaccid tone noted to be associated with right upper extremity with minimal movement; sensory dysphagia, unable to fully assess sensation component of neurologic exam;  Additionally unable to fully assess cranial nerve exam. Right facial droop noted. No tremors.     Labs on Admission: I have personally reviewed following labs and imaging studies  CBC: Recent Labs  Lab 10/17/20 2322  WBC 7.3  NEUTROABS 4.9  HGB 13.4  HCT 40.5  MCV 99.0  PLT 854*   Basic Metabolic Panel: Recent Labs  Lab 10/17/20 2322  NA 135  K 4.4  CL 104  CO2 23  GLUCOSE 103*  BUN 28*  CREATININE 1.21*  CALCIUM 9.2   GFR: CrCl cannot be calculated (Unknown ideal weight.). Liver Function Tests: Recent Labs  Lab 10/17/20 2322  AST 17  ALT 10  ALKPHOS 51  BILITOT 1.3*  PROT 6.7  ALBUMIN 3.8   No results for input(s): LIPASE, AMYLASE in the last 168 hours. No results for input(s): AMMONIA in the last 168 hours. Coagulation Profile: Recent Labs  Lab 10/17/20 2322  INR 1.2   Cardiac Enzymes: No results for input(s): CKTOTAL, CKMB, CKMBINDEX, TROPONINI in the last 168 hours. BNP (last 3 results) No results for input(s): PROBNP in the last 8760 hours. HbA1C: No results for input(s): HGBA1C in the last 72 hours. CBG: No results for input(s): GLUCAP in the last 168 hours. Lipid  Profile: No results for input(s): CHOL, HDL, LDLCALC, TRIG, CHOLHDL, LDLDIRECT in the last 72 hours. Thyroid Function Tests: No results for input(s): TSH, T4TOTAL, FREET4, T3FREE, THYROIDAB in the last 72 hours. Anemia Panel: No results for input(s): VITAMINB12, FOLATE, FERRITIN, TIBC, IRON, RETICCTPCT in the last 72 hours. Urine analysis:    Component Value Date/Time   COLORURINE YELLOW 10/17/2020 2324   APPEARANCEUR CLEAR 10/17/2020 2324   APPEARANCEUR Clear 10/11/2013 1747   LABSPEC 1.015 10/17/2020 2324   LABSPEC 1.008 10/11/2013 1747   PHURINE 5.0 10/17/2020 2324   GLUCOSEU NEGATIVE 10/17/2020 2324   GLUCOSEU Negative 10/11/2013 1747   HGBUR NEGATIVE 10/17/2020 2324   BILIRUBINUR  NEGATIVE 10/17/2020 2324   BILIRUBINUR Negative 10/11/2013 1747   KETONESUR NEGATIVE 10/17/2020 2324   PROTEINUR NEGATIVE 10/17/2020 2324   NITRITE NEGATIVE 10/17/2020 2324   LEUKOCYTESUR NEGATIVE 10/17/2020 2324   LEUKOCYTESUR 2+ 10/11/2013 1747    Radiological Exams on Admission: CT HEAD WO CONTRAST  Result Date: 10/18/2020 CLINICAL DATA:  Recent fall with right-sided facial droop and right-sided weakness, initial encounter EXAM: CT HEAD WITHOUT CONTRAST CT CERVICAL SPINE WITHOUT CONTRAST TECHNIQUE: Multidetector CT imaging of the head and cervical spine was performed following the standard protocol without intravenous contrast. Multiplanar CT image reconstructions of the cervical spine were also generated. COMPARISON:  09/20/2010 FINDINGS: CT HEAD FINDINGS Brain: Geographic area of decreased attenuation is identified in the distribution of the left middle cerebral artery consistent with acute to subacute infarct. This is new from the prior exam. Scattered cerebellar hypodensities are noted consistent with prior infarcts. No hemorrhage is seen. No abnormal mass is noted. Vascular: Mild hyperdensity is noted in the left middle cerebral artery likely representing thrombosis. Skull: Normal. Negative for  fracture or focal lesion. Sinuses/Orbits: No acute finding. Other: None. CT CERVICAL SPINE FINDINGS Alignment: Within normal limits. Skull base and vertebrae: 7 cervical segments are well visualized. Vertebral body height is well maintained. Osteophytic changes are noted at C5-6 and C6-7 with mild disc space narrowing. No significant facet hypertrophic changes are noted. No acute fracture is seen. Soft tissues and spinal canal: Surrounding soft tissue structures are within normal limits. Upper chest: Visualized lung apices are within normal limits. Other: None IMPRESSION: CT of the head: Evolving left MCA infarct with a hyperdense proximal left MCA. Old cerebellar infarcts. CT of the cervical spine: Degenerative change without acute abnormality. Critical Value/emergent results were called by telephone at the time of interpretation on 10/18/2020 at 12:01 am to Dr. Thayer Jew , who verbally acknowledged these results. Electronically Signed   By: Inez Catalina M.D.   On: 10/18/2020 00:02   CT CERVICAL SPINE WO CONTRAST  Result Date: 10/18/2020 CLINICAL DATA:  Recent fall with right-sided facial droop and right-sided weakness, initial encounter EXAM: CT HEAD WITHOUT CONTRAST CT CERVICAL SPINE WITHOUT CONTRAST TECHNIQUE: Multidetector CT imaging of the head and cervical spine was performed following the standard protocol without intravenous contrast. Multiplanar CT image reconstructions of the cervical spine were also generated. COMPARISON:  09/20/2010 FINDINGS: CT HEAD FINDINGS Brain: Geographic area of decreased attenuation is identified in the distribution of the left middle cerebral artery consistent with acute to subacute infarct. This is new from the prior exam. Scattered cerebellar hypodensities are noted consistent with prior infarcts. No hemorrhage is seen. No abnormal mass is noted. Vascular: Mild hyperdensity is noted in the left middle cerebral artery likely representing thrombosis. Skull: Normal.  Negative for fracture or focal lesion. Sinuses/Orbits: No acute finding. Other: None. CT CERVICAL SPINE FINDINGS Alignment: Within normal limits. Skull base and vertebrae: 7 cervical segments are well visualized. Vertebral body height is well maintained. Osteophytic changes are noted at C5-6 and C6-7 with mild disc space narrowing. No significant facet hypertrophic changes are noted. No acute fracture is seen. Soft tissues and spinal canal: Surrounding soft tissue structures are within normal limits. Upper chest: Visualized lung apices are within normal limits. Other: None IMPRESSION: CT of the head: Evolving left MCA infarct with a hyperdense proximal left MCA. Old cerebellar infarcts. CT of the cervical spine: Degenerative change without acute abnormality. Critical Value/emergent results were called by telephone at the time of interpretation on 10/18/2020 at 12:01  am to Dr. Thayer Jew , who verbally acknowledged these results. Electronically Signed   By: Inez Catalina M.D.   On: 10/18/2020 00:02     EKG: Independently reviewed, with result as described above.    Assessment/Plan   Angelica Strickland is a 65 y.o. female with medical history significant for chronic systolic heart failure with most recent echocardiogram in May 2015 showing LVEF less than 20%, paroxysmal atrial fibrillation not on chronic anticoagulation in the setting of thrombocytopenia, hypertension, COPD, who is admitted to [facility] on 10/17/2020 with acute ischemic stroke after presenting from home to [facility] ED via EMS for evaluation of right facial droop and right-sided weakness.    Principal Problem:   Acute ischemic stroke [facility]) Active Problems:   Atrial fibrillation ([place])   Essential hypertension   COPD (chronic obstructive pulmonary disease) ([facility])   Chronic systolic CHF (congestive heart failure) ([place])     #) Acute ischemic CVA of left MCA: Relatively last known normal at 1400 on 10/17/2020, the patient was brought  into the [facility] emergency department at 2320 with right-sided facial droop, expressive aphasia, right hemiparesis, flaccid tone and minimal movement of the right upper extremity, with ensuing noncontrast CT of the head showing evidence of acute left MCA infarct without associated hemorrhage.   Patient's case and imaging were discussed with the on-call neurologist, Dr. Lorrin Goodell, who reviewed this imaging and evaluated patient in person in the emergency department.  Per his review of presenting CT head, Dr. Lorrin Goodell found that this imaging indicated completed left MCA infarct, with no evidence of salvageable tissue, with the patient therefore unlikely to benefit from thrombectomy, and also confirmed that the patient is not a candidate for tPA.  Rather, Dr. Lorrin Goodell recommended admission to the hospital service for evaluation and management of acute ischemic stroke further imaging in the MRI brain, MRA head, carotid ultrasound, as well as further assessment of modifiable acute ischemic CVA risk factors, including checking lipid panel as well as hemoglobin A1c, with recommendation to initiate a statin if LDL found to be greater than 70.  Additionally, recommends initiation of dual antiplatelet therapy with daily baby aspirin as well as Plavix 75 mg p.o. daily, while also observing permissive hypertension for 24 hours from last known normal of 1400 on 10/17/20.   Of note, the patient possesses multiple potentially modifiable CVA risk factors including a history of paroxysmal atrial fibrillation without chronic anticoagulation in the setting of a history of thrombocytopenia, as further described above, in addition to a history of hypertension as well as being a former smoker, having completely quit smoking approximately 10 years ago after a 45-pack-year history of such.  No known history of underlying hyperlipidemia, obstructive sleep apnea, or diabetes.  Presenting EKG demonstrates rate controlled atrial  fibrillation without evidence of acute ischemic changes.  Of note, patient received ASA 300mg  PR x 1 in the ED today.     Plan: NPO until patient passes nursing bedside swallow screen vs SLP evaluation; Head of the bed at 30 degrees. Neuro checks per protocol. VS per protocol. Will allow for permissive hypertension for 24 hours following onset of acute focal neurologic deficits, during which will hold home antihypertensive medications, with prn IV antihypertensive intervention for systolic blood pressure greater than 382 mmHg or diastolic blood pressure greater than 110 mmHg until 1400 on 10/18/20. Monitor on telemetry. MRI brain. MRA head. Bilateral carotid US. Complete TTE has been ordered.  Check lipid panel and A1c. PT/OT/ST consults have  been ordered to occur in the morning, including ST consult for expressive aphasia evaluation for oropharyngeal dysphagia.  Dual antiplatelet therapy in the form of daily baby aspirin as well as Plavix 75 mg p.o. daily.  Per neurology consult, will plan to initiate statin if ensuing LDL found to be greater than 70.       #) Essential hypertension: Outpatient hypertensive regimen includes Coreg, lisinopril, as well as torsemide in the context of chronic systolic heart failure, as further detailed below.  Presenting systolic blood pressures been in the low 100s.  In the setting of acute ischemic CVA, allowing for 24 hours of permissive hypertension per neurology consultation, as further detailed above.   Plan: 24 hours of permissive hypertension, holding home antihypertensive medications during that time.  As needed IV antihypertensive intervention for systolic blood pressure greater than 629 mg or diastolic blood pressure greater than 110 mmHg. this monitoring of ensuing blood pressure via routine vital signs.       #) COPD: Documented history of such: Outpatient respiratory regimen consists of as needed albuterol.  No evidence to suggest acute COPD  exacerbation at this time.  Plan: As needed albuterol nebulizer.  Repeat BMP in the morning.      #) Chronic systolic heart failure: Most recent echocardiogram was performed in May 2015, with results notable for EF less than 20% in the setting of moderately dilated left cavity size, with additional results of this echocardiogram described above.  Outpatient diuretic regimen consists of torsemide 20 mg p.o. daily.  Additionally, home heart failure regimen includes Coreg as well as lisinopril.  No evidence to suggest acutely decompensated heart failure at this time.  Holding home antihypertensives now in the setting of 24-hour observation of permissive hypertension, as above.  Plan: Holding home Coreg and lisinopril for now, as above.  We will also hold home torsemide for now, with reassessment of volume status in the morning for assistance with determination of option of home torsemide.  Monitor on telemetry.  Add on serum magnesium level.  Monitor strict I's and O's and daily weights.      #) Paroxysmal atrial fibrillation: Documented history of such. In the setting of a CHA2DS2-VASc score of 6, there is an indication for the patient to be on chronic anticoagulation for thromboembolic prophylaxis.  However, patient has not been on chronic anticoagulation in the context of history of thrombocytopenia.  Rather, she has been on a daily full dose aspirin.  Even oral blocking regimen consists of Coreg.  Most recent echocardiogram from May 2015, with results as further detailed above.  Presenting EKG suggestive of rate controlled atrial fibrillation.   Plan: monitor strict I's & O's and daily weights. Repeat BMP and CBC in the morning. Check serum magnesium level.  Holding home Coreg for now in the setting of 24 hours of observance of permissive hypertension, as further detailed above.  Monitor on telemetry.       DVT prophylaxis: scd's  Code Status: Full code Family Communication: case discussed  with sister Disposition Plan: Per Rounding Team Consults called: Dr.  Lorrin Goodell of neurology formally consulted Admission status: Inpatient; pcu     Of note, this patient was added by me to the following Admit List/Treatment Team: [email or web address].      PLEASE NOTE THAT DRAGON DICTATION SOFTWARE WAS USED IN THE CONSTRUCTION OF THIS NOTE.   [place] [facility] Pager [phone] From [place]  Otherwise, please contact night-coverage  [email or web address] Password [email or web address]  10/18/2020, 1:07 AM

## 2020-10-18 NOTE — Evaluation (Signed)
Physical Therapy Evaluation Patient Details Name: Angelica Strickland MRN: [id number] DOB: 03/09/1956 Today's Date: 10/18/2020   History of Present Illness  65 y/o female presented to ED on 5/27 after being found down with R facial droop, R arm and leg flaccid, and L gaze preference. CT head and MRI found L MCA infarct. PMH: CHF, Afib, HTN, COPD, depression  Clinical Impression  Difficulty to obtain home setup and PLOF due to aphasia. Patient following <50% commands this session. Patient required maxA+2 for bed mobility with initiation of L LE movement towards EOB to assist. Patient with L gaze preference but able to track to R past midline. Patient required maxA to maintain sitting EOB with unilateral UE support with heavy R lateral lean. Patient presents with R sided weakness, impaired sitting balance, decreased activity tolerance, ?impaired sensation, impaired communication, and impaired functional mobility. Patient will benefit from skilled PT services during acute stay to address listed deficits. Recommend CIR at discharge to assist with optimizing functional mobility and decrease potential caregiver burden.     Follow Up Recommendations CIR    Equipment Recommendations  Other (comment) (TBD)    Recommendations for Other Services Rehab consult     Precautions / Restrictions Precautions Precautions: Fall;Other (comment) Precaution Comments: global aphasia Restrictions Weight Bearing Restrictions: No      Mobility  Bed Mobility Overal bed mobility: Needs Assistance Bed Mobility: Supine to Sit;Sit to Supine;Rolling Rolling: Max assist;+2 for safety/equipment;+2 for physical assistance   Supine to sit: Max assist;+2 for physical assistance;+2 for safety/equipment Sit to supine: Max assist;+2 for physical assistance;+2 for safety/equipment   General bed mobility comments: patient initiating supine to sit with moving L LE towards EOB. Overall maxA+2 to complete    Transfers                  General transfer comment: deferred due to poor sitting balance  Ambulation/Gait                Stairs            Wheelchair Mobility    Modified Rankin (Stroke Patients Only) Modified Rankin (Stroke Patients Only) Pre-Morbid Rankin Score: No symptoms Modified Rankin: Severe disability     Balance Overall balance assessment: Needs assistance Sitting-balance support: Single extremity supported;Feet supported Sitting balance-Leahy Scale: Poor Sitting balance - Comments: maxA to maintain sitting EOB with heavy R lateral lean and pushing with L UE at times Postural control: Right lateral lean                                   Pertinent Vitals/Pain Pain Assessment: No/denies pain Faces Pain Scale: No hurt Pain Intervention(s): Monitored during session;Repositioned    Home Living Family/patient expects to be discharged to:: Private residence Living Arrangements: Alone             Home Equipment: None Additional Comments: pt unable to state PLOF, home set up. Some info obtained from chart    Prior Function           Comments: Unsure due to global aphasia     Hand Dominance   Dominant Hand: Left (pt indicated that she was L handed, but uncertain if accurate)    Extremity/Trunk Assessment   Upper Extremity Assessment Upper Extremity Assessment: Generalized weakness;RUE deficits/detail RUE Deficits / Details: no AROM R UE, flaccid    Lower Extremity Assessment Lower Extremity Assessment: RLE deficits/detail RLE Deficits /  Details: flaccid with no movement noted. Tone noted with knee extension    Cervical / Trunk Assessment Cervical / Trunk Assessment: Normal  Communication      Cognition Arousal/Alertness: Awake/alert Behavior During Therapy: Flat affect Overall Cognitive Status: Impaired/Different from baseline Area of Impairment: Following commands                       Following Commands: Follows  one step commands inconsistently;Follows one step commands with increased time       General Comments: Following <50% of commands during session      General Comments General comments (skin integrity, edema, etc.): VSS    Exercises     Assessment/Plan    PT Assessment Patient needs continued PT services  PT Problem List Decreased strength;Decreased activity tolerance;Decreased balance;Decreased mobility;Decreased range of motion;Decreased coordination;Decreased cognition;Decreased safety awareness;Impaired sensation       PT Treatment Interventions DME instruction;Gait training;Functional mobility training;Therapeutic activities;Therapeutic exercise;Balance training;Neuromuscular re-education;Patient/family education;Wheelchair mobility training    PT Goals (Current goals can be found in the Care Plan section)  Acute Rehab PT Goals Patient Stated Goal: unable to state PT Goal Formulation: Patient unable to participate in goal setting Time For Goal Achievement: 11/01/20 Potential to Achieve Goals: Fair    Frequency Min 4X/week   Barriers to discharge        Co-evaluation               AM-PAC PT "6 Clicks" Mobility  Outcome Measure Help needed turning from your back to your side while in a flat bed without using bedrails?: A Lot Help needed moving from lying on your back to sitting on the side of a flat bed without using bedrails?: A Lot Help needed moving to and from a bed to a chair (including a wheelchair)?: Total Help needed standing up from a chair using your arms (e.g., wheelchair or bedside chair)?: Total Help needed to walk in hospital room?: Total Help needed climbing 3-5 steps with a railing? : Total 6 Click Score: 8    End of Session Equipment Utilized During Treatment: Cervical collar Activity Tolerance: Patient tolerated treatment well Patient left: in bed;with call bell/phone within reach;with bed alarm set Nurse Communication: Mobility status PT  Visit Diagnosis: Unsteadiness on feet (R26.81);Muscle weakness (generalized) (M62.81);Other abnormalities of gait and mobility (R26.89);Difficulty in walking, not elsewhere classified (R26.2);Other symptoms and signs involving the nervous system (R29.898)    Time: 4166-0630 PT Time Calculation (min) (ACUTE ONLY): 23 min   Charges:   PT Evaluation $PT Eval Moderate Complexity: 1 Mod          Citlally Captain A. Gilford Rile PT, DPT Acute Rehabilitation Services Pager [phone] Office [phone]   Linna Hoff 10/18/2020, 1:22 PM

## 2020-10-18 NOTE — ED Notes (Signed)
Pts mom took braclet and necklace

## 2020-10-18 NOTE — Evaluation (Signed)
Occupational Therapy Evaluation Patient Details Name: Angelica Strickland MRN: [id number] DOB: 1956-02-20 Today's Date: 10/18/2020    History of Present Illness 65 y/o female presented to ED on 5/27 after being found down with R facial droop, R arm and leg flaccid, and L gaze preference. CT head and MRI found L MCA infarct. PMH: CHF, Afib, HTN, COPD, depression   Clinical Impression   Pt presents with decline in function and safety with ADLs, ADL mobility with impaired strength, balance R UE ROM/coordination, cognition; pt globally aphasic. PTA pt lived at home alone and was Ind per her chart (pt unable to provide PLOF, home set up info). Pt currently requires max A +2 for bed mobility to sit EOB, max A for sitting balance/support, leans heavily to R side and total A with all ADLs/selfcare tasks. R UE with no AROM, flaccid.  Pt following <50% commands this session. Patient with L gaze preference but able to track to R past midline. Patient required maxA to maintain sitting EOB with unilateral UE support with heavy R lateral lean. Pt would benefit from acute OT services to maximize level of function and safety    Follow Up Recommendations  CIR    Equipment Recommendations  Other (comment) (TBD at next venue of care)    Recommendations for Other Services       Precautions / Restrictions Precautions Precautions: Fall;Other (comment) Precaution Comments: global aphasia Restrictions Weight Bearing Restrictions: No      Mobility Bed Mobility Overal bed mobility: Needs Assistance Bed Mobility: Supine to Sit;Sit to Supine;Rolling Rolling: Max assist;+2 for safety/equipment;+2 for physical assistance   Supine to sit: Max assist;+2 for physical assistance;+2 for safety/equipment Sit to supine: Max assist;+2 for physical assistance;+2 for safety/equipment   General bed mobility comments: patient initiating supine to sit with moving L LE towards EOB. Overall maxA+2 to complete    Transfers                  General transfer comment: deferred due to poor sitting balance    Balance Overall balance assessment: Needs assistance Sitting-balance support: Single extremity supported;Feet supported Sitting balance-Leahy Scale: Poor Sitting balance - Comments: maxA to maintain sitting EOB with heavy R lateral lean and pushing with L UE at times Postural control: Right lateral lean                                 ADL either performed or assessed with clinical judgement   ADL Overall ADL's : Needs assistance/impaired                                       General ADL Comments: Pt is total A with all ADLs/selfcare. pt unable to follow commands to wash face using L UE despite Hand over hand assist     Vision Baseline Vision/History:  (unable to properly assess at this time due to cognition and aphasia) Vision Assessment?: Vision impaired- to be further tested in functional context Additional Comments: pt able to track     Perception     Praxis      Pertinent Vitals/Pain Pain Assessment: No/denies pain Faces Pain Scale: No hurt Pain Intervention(s): Monitored during session;Repositioned     Hand Dominance Left (pt indicated that she was L handed, but uncertain if accurate)   Extremity/Trunk Assessment Upper Extremity Assessment Upper  Extremity Assessment: Generalized weakness;RUE deficits/detail RUE Deficits / Details: no AROM R UE, flaccid   Lower Extremity Assessment Lower Extremity Assessment: RLE deficits/detail RLE Deficits / Details: flaccid with no movement noted. Tone noted with knee extension   Cervical / Trunk Assessment Cervical / Trunk Assessment: Normal   Communication     Cognition Arousal/Alertness: Awake/alert Behavior During Therapy: Flat affect Overall Cognitive Status: Impaired/Different from baseline Area of Impairment: Following commands                       Following Commands: Follows one step  commands inconsistently;Follows one step commands with increased time       General Comments: Following <50% of commands during session   General Comments  VSS    Exercises     Shoulder Instructions      Home Living Family/patient expects to be discharged to:: Private residence Living Arrangements: Alone                           Home Equipment: None   Additional Comments: pt unable to state PLOF, home set up. Some info obtained from chart      Prior Functioning/Environment          Comments: Unsure due to global aphasia        OT Problem List: Decreased strength;Impaired balance (sitting and/or standing);Decreased cognition;Impaired tone;Decreased safety awareness;Decreased activity tolerance;Decreased coordination;Decreased knowledge of use of DME or AE;Impaired sensation;Impaired UE functional use;Decreased range of motion      OT Treatment/Interventions: Self-care/ADL training;DME and/or AE instruction;Therapeutic activities;Balance training;Therapeutic exercise;Cognitive remediation/compensation;Neuromuscular education;Patient/family education    OT Goals(Current goals can be found in the care plan section) Acute Rehab OT Goals Patient Stated Goal: unable to state OT Goal Formulation: Patient unable to participate in goal setting Time For Goal Achievement: 11/01/20 ADL Goals Pt Will Perform Grooming: with max assist;with mod assist;sitting Additional ADL Goal #1: Pt will roll to R side using rail with L UE mod A for staff to assist with bathing/peri hygiene Additional ADL Goal #2: Pt will sit EOB x 5-10 minutes with with mod A for balance/support Additional ADL Goal #3: Pt will tolerate PROM of RUE in multiple planes while seated  OT Frequency: Min 2X/week   Barriers to D/C:            Co-evaluation              AM-PAC OT "6 Clicks" Daily Activity     Outcome Measure Help from another person eating meals?: Total Help from another person  taking care of personal grooming?: Total Help from another person toileting, which includes using toliet, bedpan, or urinal?: Total Help from another person bathing (including washing, rinsing, drying)?: Total Help from another person to put on and taking off regular upper body clothing?: Total Help from another person to put on and taking off regular lower body clothing?: Total 6 Click Score: 6   End of Session    Activity Tolerance: Patient limited by fatigue Patient left: in bed;with call bell/phone within reach;with bed alarm set  OT Visit Diagnosis: Other abnormalities of gait and mobility (R26.89);Muscle weakness (generalized) (M62.81);Cognitive communication deficit (R41.841);Other symptoms and signs involving cognitive function;Hemiplegia and hemiparesis Hemiplegia - Right/Left: Right                Time: 3149-7026 OT Time Calculation (min): 26 min Charges:  OT General Charges $OT Visit: 1 Visit OT Evaluation $OT Eval  Moderate Complexity: 1 Mod    Britt Bottom 10/18/2020, 1:30 PM

## 2020-10-18 NOTE — Progress Notes (Signed)
Modified Barium Swallow Progress Note  Patient Details  Name: Angelica Strickland MRN: [id number] Date of Birth: [birth year]  Today's Date: 10/18/2020  Modified Barium Swallow completed.  Full report located under Chart Review in the Imaging Section.  Brief recommendations include the following:  Clinical Impression  Pt presents with moderate oral and mild pharyngeal dysphagia. Impaired oral coordination results in delay in transiting, premature spillage and compromised mastication ability *more than just from cervical collar in place.  Pharyngeal swallow c/b pooling of liquids to pyriform sinus for up to 2-3 seconds prior to swallow trigger. Pharyngeal swallow is strong without retention. Pt did not aspirate or penetrate but with amount of pooling prior to swallow, pt's inability to cough on command and multiple comorbidities increasing her asp pna risk, recommend to initiate a conservative diet of dys1/nectar and allow thin liquids between meals with strict precautions.  Oral suction before and after and medicine crushed with puree.  SLP will follow for dysphagia managment, addressing indication for advancement in diet and compensations for speech/swallow and language.  Thanks for this consult.   Swallow Evaluation Recommendations       SLP Diet Recommendations: Dysphagia 1 (Puree) solids;Nectar thick liquid;Other (Comment) (thin liquids ok between meals without foods)   Liquid Administration via: Cup;Straw   Medication Administration: Crushed with puree   Supervision: Full assist for feeding;Full supervision/cueing for compensatory strategies   Compensations: Small sips/bites;Slow rate;Lingual sweep for clearance of pocketing   Postural Changes: Seated upright at 90 degrees;Remain semi-upright after after feeds/meals (Comment)       Other Recommendations: Have oral suction available  Kathleen Lime, MS [facility] SLP Acute Rehab Services Office [phone] Pager [phone]    [name] 10/18/2020,10:40 AM

## 2020-10-18 NOTE — Progress Notes (Addendum)
STROKE TEAM PROGRESS NOTE   INTERVAL HISTORY No acute events.  Afebrile. VSS.  Plt count 144.  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 She does not follow commands reliably. She mimics for exam some of the time.  She was not attentive to examiner.  Vitals:   10/18/20 0115 10/18/20 0300 10/18/20 0430 10/18/20 0730  BP: 108/65 113/84 102/60 107/64  Pulse: 90 95 80 84  Resp: 18 (!) 23 (!) 22 (!) 23  Temp:      TempSrc:      SpO2: 100% 96% 100% 99%   CBC:  Recent Labs  Lab 10/17/20 2322 10/18/20 0554  WBC 7.3 7.1  NEUTROABS 4.9  --   HGB 13.4 12.8  HCT 40.5 38.7  MCV 99.0 98.7  PLT 144* 774*   Basic Metabolic Panel:  Recent Labs  Lab 10/17/20 2322 10/18/20 0554  NA 135 138  K 4.4 4.3  CL 104 108  CO2 23 23  GLUCOSE 103* 89  BUN 28* 25*  CREATININE 1.21* 1.10*  CALCIUM 9.2 8.9  MG  --  2.0   Lipid Panel:  Recent Labs  Lab 10/18/20 0554  CHOL 166  TRIG 66  HDL 43  CHOLHDL 3.9  VLDL 13  LDLCALC 110*   HgbA1c: No results for input(s): HGBA1C in the last 168 hours. Urine Drug Screen:  Recent Labs  Lab 10/17/20 2322  LABOPIA NONE DETECTED  COCAINSCRNUR NONE DETECTED  LABBENZ NONE DETECTED  AMPHETMU NONE DETECTED  THCU NONE DETECTED  LABBARB NONE DETECTED    Alcohol Level  Recent Labs  Lab 10/17/20 2322  ETH <10    IMAGING past 24 hours CT ANGIO HEAD NECK W WO CM  Result Date: 10/18/2020 CLINICAL DATA:  Stroke follow-up EXAM: CT ANGIOGRAPHY HEAD AND NECK TECHNIQUE: Multidetector CT imaging of the head and neck was performed using the standard protocol during bolus administration of intravenous contrast. Multiplanar CT image reconstructions and MIPs were obtained to evaluate the vascular anatomy. Carotid stenosis measurements (when applicable) are obtained utilizing NASCET criteria, using the distal internal carotid diameter as the denominator. CONTRAST:  23mL OMNIPAQUE IOHEXOL  350 MG/ML SOLN COMPARISON:  Brain MRI from earlier the same day FINDINGS: CTA NECK FINDINGS Aortic arch: Atheromatous plaque.  Two vessel branching. Right carotid system: Mainly low-density plaque at the bifurcation without ulceration or flow limiting stenosis. Left carotid system: Mainly low-density plaque at the distal common carotid without flow limiting stenosis or ulceration. Vertebral arteries: Proximal subclavian atherosclerosis without flow limiting stenosis. Mixed density plaque at the right V1 segment causes high-grade narrowing. No vertebral dissection or beading. Skeleton: Mandibular cavity and periapical erosion on the right Other neck: Thyroid atrophy. Upper chest: Partial coverage of large main pulmonary artery measuring at least 3.8 cm, consistent with pulmonary hypertension. Review of the MIP images confirms the above findings CTA HEAD FINDINGS Anterior circulation: Atheromatous plaque along the carotid siphons. Left M1 occlusion with limited reconstitution in the arterial phase. Fenestrated appearance to the right M1 segment. Negative for aneurysm. Generalized atheromatous changes to medium size vessels. Posterior circulation: Atheromatous calcification involving the right vertebral artery. No PCA occlusion or flow limiting stenosis. Fetal type right PCA flow. Venous sinuses: Unremarkable in the arterial phase Anatomic variants: As above High-density along the planum sphenoidale which appears dural based, up to 4 mm in thickness and 14 mm in diameter. There is hyperostosis of the underlying bone. Review of the MIP  images confirms the above findings IMPRESSION: 1. Left M1 occlusion. 2. No flow limiting stenosis or embolic source seen in the atheromatous cervical carotids. 3. High-grade atheromatous narrowing at the right vertebral origin. 4. 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 After convalescence a postcontrast brain MRI can be obtained. Electronically Signed   By: Monte Fantasia M.D.    On: 10/18/2020 07:02   CT HEAD WO CONTRAST  Result Date: 10/18/2020 CLINICAL DATA:  Recent fall with right-sided facial droop and right-sided weakness, initial encounter EXAM: CT HEAD WITHOUT CONTRAST CT CERVICAL SPINE WITHOUT CONTRAST TECHNIQUE: Multidetector CT imaging of the head and cervical spine was performed following the standard protocol without intravenous contrast. Multiplanar CT image reconstructions of the cervical spine were also generated. COMPARISON:  09/20/2010 FINDINGS: CT HEAD FINDINGS Brain: Geographic area of decreased attenuation is identified in the distribution of the left middle cerebral artery consistent with acute to subacute infarct. This is new from the prior exam. Scattered cerebellar hypodensities are noted consistent with prior infarcts. No hemorrhage is seen. No abnormal mass is noted. Vascular: Mild hyperdensity is noted in the left middle cerebral artery likely representing thrombosis. Skull: Normal. Negative for fracture or focal lesion. Sinuses/Orbits: No acute finding. Other: None. CT CERVICAL SPINE FINDINGS Alignment: Within normal limits. Skull base and vertebrae: 7 cervical segments are well visualized. Vertebral body height is well maintained. Osteophytic changes are noted at C5-6 and C6-7 with mild disc space narrowing. No significant facet hypertrophic changes are noted. No acute fracture is seen. Soft tissues and spinal canal: Surrounding soft tissue structures are within normal limits. Upper chest: Visualized lung apices are within normal limits. Other: None IMPRESSION: CT of the head: Evolving left MCA infarct with a hyperdense proximal left MCA. Old cerebellar infarcts. CT of the cervical spine: Degenerative change without acute abnormality. Critical Value/emergent results were called by telephone at the time of interpretation on 10/18/2020 at 12:01 am to Dr. Thayer Jew , who verbally acknowledged these results. Electronically Signed   By: Inez Catalina M.D.    On: 10/18/2020 00:02   CT CERVICAL SPINE WO CONTRAST  Result Date: 10/18/2020 CLINICAL DATA:  Recent fall with right-sided facial droop and right-sided weakness, initial encounter EXAM: CT HEAD WITHOUT CONTRAST CT CERVICAL SPINE WITHOUT CONTRAST TECHNIQUE: Multidetector CT imaging of the head and cervical spine was performed following the standard protocol without intravenous contrast. Multiplanar CT image reconstructions of the cervical spine were also generated. COMPARISON:  09/20/2010 FINDINGS: CT HEAD FINDINGS Brain: Geographic area of decreased attenuation is identified in the distribution of the left middle cerebral artery consistent with acute to subacute infarct. This is new from the prior exam. Scattered cerebellar hypodensities are noted consistent with prior infarcts. No hemorrhage is seen. No abnormal mass is noted. Vascular: Mild hyperdensity is noted in the left middle cerebral artery likely representing thrombosis. Skull: Normal. Negative for fracture or focal lesion. Sinuses/Orbits: No acute finding. Other: None. CT CERVICAL SPINE FINDINGS Alignment: Within normal limits. Skull base and vertebrae: 7 cervical segments are well visualized. Vertebral body height is well maintained. Osteophytic changes are noted at C5-6 and C6-7 with mild disc space narrowing. No significant facet hypertrophic changes are noted. No acute fracture is seen. Soft tissues and spinal canal: Surrounding soft tissue structures are within normal limits. Upper chest: Visualized lung apices are within normal limits. Other: None IMPRESSION: CT of the head: Evolving left MCA infarct with a hyperdense proximal left MCA. Old cerebellar infarcts. CT of the cervical spine: Degenerative change without  acute abnormality. Critical Value/emergent results were called by telephone at the time of interpretation on 10/18/2020 at 12:01 am to Dr. Thayer Jew , who verbally acknowledged these results. Electronically Signed   By: Inez Catalina M.D.   On: 10/18/2020 00:02   MR ANGIO HEAD WO CONTRAST  Result Date: 10/18/2020 CLINICAL DATA:  Initial evaluation for acute stroke. EXAM: MRI HEAD WITHOUT CONTRAST MRA HEAD WITHOUT CONTRAST TECHNIQUE: Multiplanar, multi-echo pulse sequences of the brain and surrounding structures were acquired without intravenous contrast. Angiographic images of the Circle of Willis were acquired using MRA technique without intravenous contrast. COMPARISON: No pertinent prior exam. COMPARISON:  Prior CT from 10/17/2020 FINDINGS: MRI HEAD FINDINGS Brain: Examination mildly degraded by motion artifact. Diffuse prominence of the CSF containing spaces compatible generalized age-related cerebral atrophy. Patchy and confluent T2/FLAIR hyperintensity within the periventricular and deep white matter both cerebral hemispheres most consistent with chronic small vessel ischemic disease. Remote lacunar infarct at the anterior genu of the corpus callosum. Few remote lacunar infarcts about the pons. Multiple bilateral cerebellar infarcts noted, right greater than left. Moderately large confluent area of restricted diffusion involving the left frontal and temporal regions consistent with an acute to early subacute left MCA distribution infarct. Involvement of the left insula, overlying left frontal operculum, left basal ganglia, and mid-posterior left temporal lobe. Mild petechial hemorrhage at the level of the left lentiform nucleus without frank hemorrhagic transformation. Minimal effacement of the left lateral ventricle without significant regional mass effect or midline shift at this time. No other evidence for acute or subacute ischemia. Gray-white matter differentiation otherwise maintained. No other evidence for acute or chronic intracranial hemorrhage. No mass lesion. No hydrocephalus or extra-axial fluid collection. No made of an empty sella. Midline structures intact. Vascular: Attenuated flow within the left MCA  distribution. Major intracranial vascular flow voids are otherwise maintained. Skull and upper cervical spine: Craniocervical junction within normal limits. Bone marrow signal intensity within normal limits. Mild edema noted at the scalp vertex. Sinuses/Orbits: Globes and orbital soft tissues demonstrate no acute finding. Mild scattered mucosal thickening noted within the ethmoidal air cells and maxillary sinuses. Paranasal sinuses are otherwise clear. Small right mastoid effusion noted, of doubtful significance. Inner ear structures grossly normal. Other: None. MRA HEAD FINDINGS ANTERIOR CIRCULATION: Examination mildly degraded by motion artifact. Distal cervical segments of the internal carotid arteries are patent with antegrade flow. Petrous segments patent bilaterally. Mild atheromatous irregularity within the carotid siphons without hemodynamically significant stenosis. A1 segments patent bilaterally. Normal anterior communicating artery complex. Anterior cerebral arteries patent to their distal aspects without stenosis. There is occlusion of the proximal-mid left M1 segment. A small temporal branch remains perfused. Otherwise, no significant flow seen within the left MCA distribution by MRA. Right M1 segment patent without stenosis. Right MCA bifurcation grossly within normal limits, although evaluation somewhat limited by motion. Distal right MCA branches well perfused. POSTERIOR CIRCULATION: Both V4 segments patent to the vertebrobasilar junction without stenosis. Right vertebral artery slightly dominant. Right PICA patent. Left PICA not seen. Basilar widely patent proximally. Short-segment mild stenosis at the distal basilar artery noted (series 1036, image 14). Superior cerebellar arteries patent bilaterally. Left PCA supplied via the basilar as well as a small left posterior communicating artery. Predominant fetal type origin of the right PCA. Both PCAs well perfused or distal aspects without stenosis. No  aneurysm. IMPRESSION: MRI HEAD IMPRESSION: 1. Moderately large acute to early subacute left MCA distribution infarct as above. Associated mild petechial hemorrhage without frank  hemorrhagic transformation. No significant mass effect at this time. 2. Underlying age-related cerebral atrophy with chronic small vessel ischemic disease with multiple remote bilateral cerebellar infarcts. MRA HEAD IMPRESSION: 1. Occlusion of the proximal-mid left M1 segment. A small temporal branch remains perfused. Otherwise, no significant flow within the left MCA distribution by MRA. 2. Mild atherosclerotic change elsewhere within the intracranial circulation with no other hemodynamically significant or correctable stenosis. Electronically Signed   By: Jeannine Boga M.D.   On: 10/18/2020 03:01   MR BRAIN WO CONTRAST  Result Date: 10/18/2020 CLINICAL DATA:  Initial evaluation for acute stroke. EXAM: MRI HEAD WITHOUT CONTRAST MRA HEAD WITHOUT CONTRAST TECHNIQUE: Multiplanar, multi-echo pulse sequences of the brain and surrounding structures were acquired without intravenous contrast. Angiographic images of the Circle of Willis were acquired using MRA technique without intravenous contrast. COMPARISON: No pertinent prior exam. COMPARISON:  Prior CT from 10/17/2020 FINDINGS: MRI HEAD FINDINGS Brain: Examination mildly degraded by motion artifact. Diffuse prominence of the CSF containing spaces compatible generalized age-related cerebral atrophy. Patchy and confluent T2/FLAIR hyperintensity within the periventricular and deep white matter both cerebral hemispheres most consistent with chronic small vessel ischemic disease. Remote lacunar infarct at the anterior genu of the corpus callosum. Few remote lacunar infarcts about the pons. Multiple bilateral cerebellar infarcts noted, right greater than left. Moderately large confluent area of restricted diffusion involving the left frontal and temporal regions consistent with an acute  to early subacute left MCA distribution infarct. Involvement of the left insula, overlying left frontal operculum, left basal ganglia, and mid-posterior left temporal lobe. Mild petechial hemorrhage at the level of the left lentiform nucleus without frank hemorrhagic transformation. Minimal effacement of the left lateral ventricle without significant regional mass effect or midline shift at this time. No other evidence for acute or subacute ischemia. Gray-white matter differentiation otherwise maintained. No other evidence for acute or chronic intracranial hemorrhage. No mass lesion. No hydrocephalus or extra-axial fluid collection. No made of an empty sella. Midline structures intact. Vascular: Attenuated flow within the left MCA distribution. Major intracranial vascular flow voids are otherwise maintained. Skull and upper cervical spine: Craniocervical junction within normal limits. Bone marrow signal intensity within normal limits. Mild edema noted at the scalp vertex. Sinuses/Orbits: Globes and orbital soft tissues demonstrate no acute finding. Mild scattered mucosal thickening noted within the ethmoidal air cells and maxillary sinuses. Paranasal sinuses are otherwise clear. Small right mastoid effusion noted, of doubtful significance. Inner ear structures grossly normal. Other: None. MRA HEAD FINDINGS ANTERIOR CIRCULATION: Examination mildly degraded by motion artifact. Distal cervical segments of the internal carotid arteries are patent with antegrade flow. Petrous segments patent bilaterally. Mild atheromatous irregularity within the carotid siphons without hemodynamically significant stenosis. A1 segments patent bilaterally. Normal anterior communicating artery complex. Anterior cerebral arteries patent to their distal aspects without stenosis. There is occlusion of the proximal-mid left M1 segment. A small temporal branch remains perfused. Otherwise, no significant flow seen within the left MCA distribution  by MRA. Right M1 segment patent without stenosis. Right MCA bifurcation grossly within normal limits, although evaluation somewhat limited by motion. Distal right MCA branches well perfused. POSTERIOR CIRCULATION: Both V4 segments patent to the vertebrobasilar junction without stenosis. Right vertebral artery slightly dominant. Right PICA patent. Left PICA not seen. Basilar widely patent proximally. Short-segment mild stenosis at the distal basilar artery noted (series 1036, image 14). Superior cerebellar arteries patent bilaterally. Left PCA supplied via the basilar as well as a small left posterior communicating artery. Predominant fetal  type origin of the right PCA. Both PCAs well perfused or distal aspects without stenosis. No aneurysm. IMPRESSION: MRI HEAD IMPRESSION: 1. Moderately large acute to early subacute left MCA distribution infarct as above. Associated mild petechial hemorrhage without frank hemorrhagic transformation. No significant mass effect at this time. 2. Underlying age-related cerebral atrophy with chronic small vessel ischemic disease with multiple remote bilateral cerebellar infarcts. MRA HEAD IMPRESSION: 1. Occlusion of the proximal-mid left M1 segment. A small temporal branch remains perfused. Otherwise, no significant flow within the left MCA distribution by MRA. 2. Mild atherosclerotic change elsewhere within the intracranial circulation with no other hemodynamically significant or correctable stenosis. Electronically Signed   By: Jeannine Boga M.D.   On: 10/18/2020 03:01   DG Swallowing Func-Speech Pathology  Result Date: 10/18/2020 Objective Swallowing Evaluation: Type of Study: Bedside Swallow Evaluation  Patient Details Name: Angelica Strickland MRN: [id number] Date of Birth: July 08, 1955 Today's Date: 10/18/2020 Time: SLP Start Time (ACUTE ONLY): 0948 -SLP Stop Time (ACUTE ONLY): 1005 SLP Time Calculation (min) (ACUTE ONLY): 17 min Past Medical History: Past Medical History:  Diagnosis Date . Atrial fibrillation ([place]) 11/11/2014 . Carpal tunnel syndrome 11/11/2014 . Congestive heart failure ([place]) 11/11/2014 . COPD (chronic obstructive pulmonary disease) ([place]) 11/11/2014 . Depression 11/11/2014 . Essential hypertension 11/11/2014 . History of pituitary tumor 11/11/2014 . Hypopotassemia 11/11/2014 . Osteoarthritis 11/11/2014 . Thrombocytopenia ([place]) 11/11/2014 Past Surgical History: Past Surgical History: Procedure Laterality Date . CARDIAC CATHETERIZATION   . PITUITARY SURGERY   . UTERINE FIBROID SURGERY   HPI: Pt is a 65 yo adm to [facility] after recent fall, new onset of right side weakness, facial asymmetery and aphasia.  Imaging of brain showed moderately large acute to subacute CVA due to Left M1 occlusion.  Pt also with remote bilateral cerebellar cvas.  Cervical spine imaging showed osteophytic changes C5-C6, C6-C7.  PMH + for pituitary tumor, COPD, CHF, smoking.  .  Pt did not pass [facility].  Subjective: pt awake in chair, sleepier than normal Assessment / Plan / Recommendation CHL IP CLINICAL IMPRESSIONS 10/18/2020 Clinical Impression Pt presents with moderate oral and mild pharyngeal dysphagia. Impaired oral coordination results in delay in transiting, premature spillage and compromised mastication ability *more than just from cervical collar in place.  Pharyngeal swallow c/b pooling of liquids to pyriform sinus for up to 2-3 seconds prior to swallow trigger. Pharyngeal swallow is strong without retention. Pt did not aspirate or penetrate but with amount of pooling prior to swallow, pt's inability to cough on command and multiple comorbidities increasing her asp pna risk, recommend to initiate a conservative diet of dys1/nectar and allow thin liquids between meals with strict precautions.  Oral suction before and after and medicine crushed with puree.  SLP will follow for dysphagia managment, addressing indication for advancement in diet and compensations for speech/swallow and language.  Thanks for  this consult. SLP Visit Diagnosis Dysphagia, oropharyngeal phase (R13.12) Attention and concentration deficit following -- Frontal lobe and executive function deficit following -- Impact on safety and function Moderate aspiration risk   CHL IP TREATMENT RECOMMENDATION 10/18/2020 Treatment Recommendations Therapy as outlined in treatment plan below   Prognosis 10/18/2020 Prognosis for Safe Diet Advancement Good Barriers to Reach Goals -- Barriers/Prognosis Comment -- CH[facility] IP DIET RECOMMENDATION 10/18/2020 SLP Diet Recommendations Dysphagia 1 (Puree) solids;Nectar thick liquid;Other (Comment) Liquid Administration via Cup;Straw Medication Administration Crushed with puree Compensations Small sips/bites;Slow rate;Lingual sweep for clearance of pocketing Postural Changes Seated upright at 90 degrees;Remain semi-upright after after feeds/meals (Comment)  CHL IP OTHER RECOMMENDATIONS 10/18/2020 Recommended Consults -- Oral Care Recommendations -- Other Recommendations Have oral suction available   CHL IP FOLLOW UP RECOMMENDATIONS 10/18/2020 Follow up Recommendations Skilled Nursing facility   [facility] IP FREQUENCY AND DURATION 10/18/2020 Speech Therapy Frequency (ACUTE ONLY) min 2x/week Treatment Duration 2 weeks      CHL IP ORAL PHASE 10/18/2020 Oral Phase Impaired Oral - Pudding Teaspoon -- Oral - Pudding Cup -- Oral - Honey Teaspoon -- Oral - Honey Cup -- Oral - Nectar Teaspoon Weak lingual manipulation;Delayed oral transit;Premature spillage Oral - Nectar Cup Weak lingual manipulation;Delayed oral transit;Premature spillage Oral - Nectar Straw Delayed oral transit;Weak lingual manipulation;Premature spillage Oral - Thin Teaspoon Weak lingual manipulation;Decreased bolus cohesion;Premature spillage Oral - Thin Cup -- Oral - Thin Straw Weak lingual manipulation;Delayed oral transit;Premature spillage Oral - Puree Delayed oral transit;Weak lingual manipulation Oral - Mech Soft Impaired mastication;Weak lingual manipulation;Delayed  oral transit;Lingual/palatal residue Oral - Regular -- Oral - Multi-Consistency -- Oral - Pill -- Oral Phase - Comment cervical collar in place that may have further exacerbated mastication deficits but obvious oral musculature weakness impacting as well  CHL IP PHARYNGEAL PHASE 10/18/2020 Pharyngeal Phase Impaired Pharyngeal- Pudding Teaspoon -- Pharyngeal -- Pharyngeal- Pudding Cup -- Pharyngeal -- Pharyngeal- Honey Teaspoon -- Pharyngeal -- Pharyngeal- Honey Cup -- Pharyngeal -- Pharyngeal- Nectar Teaspoon Delayed swallow initiation-pyriform sinuses Pharyngeal Material does not enter airway Pharyngeal- Nectar Cup Delayed swallow initiation-pyriform sinuses Pharyngeal Material does not enter airway Pharyngeal- Nectar Straw Delayed swallow initiation-pyriform sinuses Pharyngeal Material does not enter airway Pharyngeal- Thin [place] Delayed swallow initiation-pyriform sinuses Pharyngeal Material does not enter airway Pharyngeal- Thin Cup -- Pharyngeal -- Pharyngeal- Thin Straw Delayed swallow initiation-pyriform sinuses Pharyngeal Material does not enter airway Pharyngeal- Puree Delayed swallow initiation-vallecula Pharyngeal Material does not enter airway Pharyngeal- Mechanical Soft Delayed swallow initiation-vallecula Pharyngeal Material does not enter airway Pharyngeal- Regular -- Pharyngeal -- Pharyngeal- Multi-consistency -- Pharyngeal -- Pharyngeal- Pill -- Pharyngeal -- Pharyngeal Comment pharyngeal swallow is strong albeit delayed  CHL IP CERVICAL ESOPHAGEAL PHASE 10/18/2020 Cervical Esophageal Phase Impaired Pudding Teaspoon -- Pudding Cup -- Honey Teaspoon -- Honey Cup -- Nectar Teaspoon -- Nectar Cup -- Nectar Straw -- Thin Teaspoon -- Thin Cup -- Thin Straw -- Puree -- Mechanical Soft -- Regular -- Multi-consistency -- Pill -- Cervical Esophageal Comment could not view distal cervical spine due to pt's shoulder elevated and covering view, therefore unable to determine impact of cervical osteophytes on  swallow - however did not view obvious barium retention, Upon esophageal sweep- esophagus appeared clear Angelica Lime, MS [facility] SLP Acute Rehab Services Office [phone] Pager [phone] [name] 10/18/2020, 10:41 AM               PHYSICAL EXAM General: Elderly female lying in bed with cervical collar intact. NAD. Pulmonary: Normal respiratory effort. Ext: No cyanosis, edema, or deformity Skin: No rash. Normal palpation of skin.   Neurologic Examination  Mental status/Cognition: opens eyes to voice, tracks face on the left. Does not cross midline.  Speech/language: mute, global aphasia. Cranial nerves:   CN II Pupils equal and reactive to light, does not blink to threat on the right.   CN III,IV,VI L gaze deviation, no nystagmus.   CN V    CN VII R Facial droop   CN VIII    CN IX & X    CN XI    CN XII    Motor/sensory:  Muscle bulk: normal, tone flaccid in RUE Unable  to do detailed strength testing secondary to aphasia.  Minimal movement in RUE Spontaneously moves BL lower extremities but moves LLE more than RLE. Withdraws BL lower extremities to pain. Hold LUE up against gravity (mimics) and also moving spontaneously.   ASSESSMENT/PLAN Angelica Strickland is a 65 y.o. female with PMH significant for chronic systolic heart failure with most recent echocardiogram in May 2015 showing LVEF less than 20%, paroxysmal atrial fibrillation not on chronic anticoagulation d/t thrombocytopenia, hypertension, COPD,hx of COPD, HTN, depression, Osteoarthritis who was found on the floor with side facial droop, R arm and leg weakness and a left gaze preference and aphasia.  Stroke - Left MCA large stroke due to left M1 occlusion, likely due to atrial fibrillation not on AC and CHF with history of EF 20% (current echo pending)   Code Stroke CT head:  evolving left MCA infarct with a hyperdense proximal left MCA. Old cerebellar infarcts  CTA head & neck Left M1 occlusion. No flow  limiting stenosis or embolic source seen in the atheromatous cervical carotids. High-grade atheromatous narrowing at the right vertebral origin. 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 Associated mild petechial hemorrhage without frank hemorrhagic transformation. cerebral atrophy with chronic small vessel ischemic disease with multiple remote bilateral cerebellar infarcts.  MRA  Occlusion of the proximal-mid left M1 segment. A small temporal branch remains perfused. Otherwise, no significant flow within the left MCA distribution by MRA. Mild atherosclerotic change elsewhere within the intracranial circulation with no other hemodynamically significant or correctable stenosis.  2D Echo Pending  LDL 110  HgbA1c send out, pending  VTE prophylaxis - lovenox  On no antithrombotics prior to admission, now on ASA 324mg  PO or 300mg  PR depending upon swallow status. 5 days post stroke (June 1st) consider starting Eliquis if remains stable.   Therapy recommendations:  CIR  Disposition:  Pending  Hx of thrombocytopenia  This admission, Platelets stable at 130  Monitor   Management per primary team  Chronic systolic HF  Hx of EF 10%  This admission, 2D Echo pending  Management per primary team  Hypertension  Stable . Long-term BP goal normotensive  Hyperlipidemia  Home meds:  None  LDL 110, not at  goal < 70  High intensity statin is indicated once swallow status has been determined.   Continue statin at discharge  Blood Glucose Management  HgbA1c pending send out goal < 7.0  CBGs  SSI  Dysphagia  SLP following  On dsyphagia 1 diet recommended with crushed meds recommended  Other Stroke Risk Factors  Advanced Age >/= 35   Former Cigarette smoker  Hx stroke/TIA  Family hx stroke (Mother)  Congestive heart failure  Other Active Problems  Small Meningioma: follow  up as outpatient   COPD  Hospital day # 0  Angelica A Bailey-Modzik,NP-C  ATTENDING NOTE: I reviewed above note and agree with the assessment and plan. Pt was seen and examined.   65 year old female with history of A. fib not on AC, hypertension admitted for found down at home, with right-sided weakness, left gaze, aphasia and right facial droop.  CT showed left MCA established infarct and left MCA hyperdense sign.  MRI showed left MCA moderate large infarct.  MRI again left M1 occlusion.  CTA head and neck confirmed left M1 occlusion, high-grade stenosis and right VA origin, and the possible small sphenoidal meningioma.  2D echo pending.  LDL 110 and A1c  pending.  Creatinine 1.21, UDS negative.  On exam, patient lying in bed, no family at bedside, drowsy sleepy, able to briefly open eyes with voice, mumbling words, intangible, not following commands.  Left gaze preference, however able to cross midline.  Blinking to visual threat on the left but not to the right.  Right facial droop.  Left upper extremity against gravity, left lower extremity withdraw to pain at least 3/5.  Right upper extremity mild withdraw to pain, right lower extremity slight withdraw to pain. Sensation, coordination not corporative and gait not tested.  Etiology for patient stroke likely due to A. fib not on AC.  2D echo is pending to evaluate EF given patient previous history of low EF at 20%.  Currently patient not passed swallow, on aspirin PR 300.  Continue IV fluid.  Once p.o. access, consider aspirin 325 and statin.  Will consider Eliquis in 5 days post stroke for stroke prevention.  PT/OT recommend CIR.  For detailed assessment and plan, please refer to above as I have made changes wherever appropriate.   Angelica Hawking, MD PhD Stroke Neurology 10/18/2020 7:39 PM    To contact Stroke Continuity provider, please refer to [email or web address] After hours, contact General Neurology

## 2020-10-18 NOTE — Progress Notes (Signed)
OT Cancellation Note  Patient Details Name: Angelica Strickland MRN: [id number] DOB: 21-May-1956   Cancelled Treatment:    Reason Eval/Treat Not Completed: Patient at procedure or test/ unavailable;Other (comment). Transport coming to pick up pt for MBS. OT and PT will try back later as available  Britt Bottom 10/18/2020, 9:42 AM

## 2020-10-18 NOTE — Evaluation (Signed)
Clinical/Bedside Swallow Evaluation Patient Details  Name: Angelica Strickland MRN: [id number] Date of Birth: August 26, 1955  Today's Date: 10/18/2020 Time: SLP Start Time (ACUTE ONLY): 0820 SLP Stop Time (ACUTE ONLY): 0840 SLP Time Calculation (min) (ACUTE ONLY): 20 min  Past Medical History:  Past Medical History:  Diagnosis Date  . Atrial fibrillation ([place]) 11/11/2014  . Carpal tunnel syndrome 11/11/2014  . Congestive heart failure ([place]) 11/11/2014  . COPD (chronic obstructive pulmonary disease) ([place]) 11/11/2014  . Depression 11/11/2014  . Essential hypertension 11/11/2014  . History of pituitary tumor 11/11/2014  . Hypopotassemia 11/11/2014  . Osteoarthritis 11/11/2014  . Thrombocytopenia ([place]) 11/11/2014   Past Surgical History:  Past Surgical History:  Procedure Laterality Date  . CARDIAC CATHETERIZATION    . PITUITARY SURGERY    . UTERINE FIBROID SURGERY     HPI:  Pt is a 65 yo adm to [facility] after recent fall, new onset of right side weakness, facial asymmetery and aphasia.  Imaging of brain showed moderately large acute to subacute CVA due to Left M1 occlusion.  Pt also with remote bilateral cerebellar cvas.  Cervical spine imaging showed osteophytic changes C5-C6, C6-C7.  PMH + for pituitary tumor, COPD, CHF, smoking.  .  Pt did not pass [facility].   Assessment / Plan / Recommendation Clinical Impression  Pt presents with clinical indications concerning for oropharyngeal dysphagia with obvious facial and hypoglossal nerve deficits - but concerns present for vagus, glosspharyngeal nerve impairment as well.  Motor planning impaired resulting in clinically delayed oral transiting, labial retention of puree.  Pharyngeal swallow appeared mildly delayed but was audible and subtle throat clearing noted after swallowing - concerning for airway infiltration without adequacy of sensation. Pt did not swallow 3 ounces of water with two trials.    Cervical osteophytes C5-C6, C6-C7 noted on imaging may furhter  impair pharyngeal-cervical esophageal clearance.  Will proceed with MBS as Dr Erlinda Hong approved to assure adequate airway protection with intake. SLP Visit Diagnosis: Dysphagia, oral phase (R13.11);Dysphagia, unspecified (R13.10)    Aspiration Risk  Moderate aspiration risk    Diet Recommendation NPO        Other  Recommendations Oral Care Recommendations: Oral care QID   Follow up Recommendations        Frequency and Duration   tbd         Prognosis   good for goals     Swallow Study   General Date of Onset: 10/18/20 HPI: Pt is a 65 yo adm to [facility] after recent fall, new onset of right side weakness, facial asymmetery and aphasia.  Imaging of brain showed moderately large acute to subacute CVA due to Left M1 occlusion.  Pt also with remote bilateral cerebellar cvas.  Cervical spine imaging showed osteophytic changes C5-C6, C6-C7.  PMH + for pituitary tumor, COPD, CHF, smoking.  .  Pt did not pass [facility]. Type of Study: Bedside Swallow Evaluation Previous Swallow Assessment: none in epic Diet Prior to this Study: NPO Temperature Spikes Noted: No Respiratory Status: Room air History of Recent Intubation: No Behavior/Cognition: Alert;Cooperative;Requires cueing;Other (Comment) Oral Cavity Assessment: Within Functional Limits Oral Care Completed by SLP: Yes Oral Cavity - Dentition: Adequate natural dentition Self-Feeding Abilities: Needs assist Patient Positioning: Upright in bed Baseline Vocal Quality: Other (comment) (inconsistently observed with automatic speech tasks, voice was weak) Volitional Cough: Cognitively unable to elicit    Oral/Motor/Sensory Function Overall Oral Motor/Sensory Function: Moderate impairment Facial ROM: Reduced right Facial Symmetry: Abnormal symmetry right Facial Strength: Reduced right  Lingual ROM: Reduced right Lingual Strength: Reduced Velum:  (DNT, difficult to view) Mandible: Other (Comment) (pt in cervical collar, did not test)   Ice Chips Ice  chips: Impaired Oral Phase Impairments: Reduced labial seal Pharyngeal Phase Impairments: Suspected delayed Swallow   Thin Liquid Thin Liquid: Impaired Presentation: Straw Oral Phase Impairments: Reduced labial seal Pharyngeal  Phase Impairments: Throat Clearing - Delayed    Nectar Thick Nectar Thick Liquid: Impaired Presentation: Spoon;Straw Oral Phase Impairments: Reduced labial seal Pharyngeal Phase Impairments: Suspected delayed Swallow;Throat Clearing - Delayed   Honey Thick Honey Thick Liquid: Not tested   Puree Puree: Impaired Presentation: Spoon Oral Phase Impairments: Reduced labial seal Oral Phase Functional Implications: Right anterior spillage   Solid     Solid: Not tested      [name] 10/18/2020,9:43 AM   Kathleen Lime, MS [place] Office [phone] Pager [phone]

## 2020-10-19 ENCOUNTER — Inpatient Hospital Stay (HOSPITAL_COMMUNITY): Payer: Medicare Other

## 2020-10-19 DIAGNOSIS — I6389 Other cerebral infarction: Secondary | ICD-10-CM

## 2020-10-19 LAB — URINE CULTURE: Culture: NO GROWTH

## 2020-10-19 LAB — ECHOCARDIOGRAM COMPLETE
Area-P 1/2: 5.66 cm2
Calc EF: 29.5 %
MV M vel: 3.73 m/s
MV Peak grad: 55.7 mmHg
P 1/2 time: 461 msec
S' Lateral: 5.6 cm
Single Plane A2C EF: 28.6 %
Single Plane A4C EF: 34.7 %

## 2020-10-19 LAB — CBC
HCT: 34.7 % — ABNORMAL LOW (ref 36.0–46.0)
Hemoglobin: 11.3 g/dL — ABNORMAL LOW (ref 12.0–15.0)
MCH: 32.8 pg (ref 26.0–34.0)
MCHC: 32.6 g/dL (ref 30.0–36.0)
MCV: 100.9 fL — ABNORMAL HIGH (ref 80.0–100.0)
Platelets: 105 10*3/uL — ABNORMAL LOW (ref 150–400)
RBC: 3.44 MIL/uL — ABNORMAL LOW (ref 3.87–5.11)
RDW: 13.4 % (ref 11.5–15.5)
WBC: 6.1 10*3/uL (ref 4.0–10.5)
nRBC: 0 % (ref 0.0–0.2)

## 2020-10-19 LAB — BASIC METABOLIC PANEL
Anion gap: 8 (ref 5–15)
BUN: 20 mg/dL (ref 8–23)
CO2: 18 mmol/L — ABNORMAL LOW (ref 22–32)
Calcium: 8.5 mg/dL — ABNORMAL LOW (ref 8.9–10.3)
Chloride: 113 mmol/L — ABNORMAL HIGH (ref 98–111)
Creatinine, Ser: 0.96 mg/dL (ref 0.44–1.00)
GFR, Estimated: >60 mL/min (ref >60)
Glucose, Bld: 92 mg/dL (ref 70–99)
Potassium: 4 mmol/L (ref 3.5–5.1)
Sodium: 139 mmol/L (ref 135–145)

## 2020-10-19 LAB — LACTIC ACID, PLASMA: Lactic Acid, Venous: 1.4 mmol/L (ref 0.5–1.9)

## 2020-10-19 MED ORDER — ATORVASTATIN CALCIUM 40 MG PO TABS
40.0000 mg | ORAL_TABLET | Freq: Every day | ORAL | Status: DC
  Administered 2020-10-19 – 2020-10-31 (×13): 40 mg via ORAL
  Filled 2020-10-19 (×13): qty 1

## 2020-10-19 NOTE — Progress Notes (Signed)
@  approx 2000 Dr. Hal Hope, on-call for attending, text-paged regarding pt's BP (85/53) per order. Page promptly returned and order received to adjust bed height and obtain LA. Will continue to monitor and assess. Pt presently seems asymptomatic with regards to her BPs which have persisted in the 18H-909P systolic over the past 36 hours.

## 2020-10-19 NOTE — Plan of Care (Signed)
  Problem: Education: Goal: Knowledge of General Education information will improve Description: Including pain rating scale, medication(s)/side effects and non-pharmacologic comfort measures Outcome: Progressing   Problem: Health Behavior/Discharge Planning: Goal: Ability to manage health-related needs will improve Outcome: Progressing   Problem: Clinical Measurements: Goal: Ability to maintain clinical measurements within normal limits will improve Outcome: Progressing Goal: Will remain free from infection Outcome: Progressing Goal: Diagnostic test results will improve Outcome: Progressing Goal: Respiratory complications will improve Outcome: Progressing Goal: Cardiovascular complication will be avoided Outcome: Progressing   Problem: Activity: Goal: Risk for activity intolerance will decrease Outcome: Progressing   Problem: Nutrition: Goal: Adequate nutrition will be maintained Outcome: Progressing   Problem: Coping: Goal: Level of anxiety will decrease Outcome: Progressing   Problem: Elimination: Goal: Will not experience complications related to bowel motility Outcome: Progressing Goal: Will not experience complications related to urinary retention Outcome: Progressing   Problem: Pain Managment: Goal: General experience of comfort will improve Outcome: Progressing   Problem: Safety: Goal: Ability to remain free from injury will improve Outcome: Progressing   Problem: Skin Integrity: Goal: Risk for impaired skin integrity will decrease Outcome: Progressing   Problem: Education: Goal: Knowledge of disease or condition will improve Outcome: Progressing Goal: Knowledge of secondary prevention will improve Outcome: Progressing Goal: Knowledge of patient specific risk factors addressed and post discharge goals established will improve Outcome: Progressing Goal: Individualized Educational Video(s) Outcome: Progressing   Problem: Health Behavior/Discharge  Planning: Goal: Ability to manage health-related needs will improve Outcome: Progressing   Problem: Self-Care: Goal: Ability to participate in self-care as condition permits will improve Outcome: Progressing Goal: Ability to communicate needs accurately will improve Outcome: Progressing   Problem: Nutrition: Goal: Risk of aspiration will decrease Outcome: Progressing Goal: Dietary intake will improve Outcome: Progressing   Problem: Ischemic Stroke/TIA Tissue Perfusion: Goal: Complications of ischemic stroke/TIA will be minimized Outcome: Progressing

## 2020-10-19 NOTE — Progress Notes (Addendum)
STROKE TEAM PROGRESS NOTE   INTERVAL HISTORY No acute events.  Tmax 99.2, some hypotension overnight. Now on oxygen by nasal cannula 1-2L. PLT 105 today. Aphasia limits ROS and gathering of subjective data. CNA is feeding her. She is eating with fair appetite. She has verbalized "huh" and "what" per CNA.  No visitors at bedside.  Vitals:   10/18/20 2012 10/18/20 2347 10/19/20 0307 10/19/20 0334  BP: 104/80 95/62 (!) 83/57 (S) (!) 99/58  Pulse: (!) 105 87 81   Resp: 17 18 19    Temp: 99.2 F (37.3 C) 99.2 F (37.3 C) 98 F (36.7 C)   TempSrc: Oral Oral Oral   SpO2: 100% 100% 100%    CBC:  Recent Labs  Lab 10/17/20 2322 10/18/20 0554 10/19/20 0358  WBC 7.3 7.1 6.1  NEUTROABS 4.9  --   --   HGB 13.4 12.8 11.3*  HCT 40.5 38.7 34.7*  MCV 99.0 98.7 100.9*  PLT 144* 130* 262*   Basic Metabolic Panel:  Recent Labs  Lab 10/18/20 0554 10/19/20 0358  NA 138 139  K 4.3 4.0  CL 108 113*  CO2 23 18*  GLUCOSE 89 92  BUN 25* 20  CREATININE 1.10* 0.96  CALCIUM 8.9 8.5*  MG 2.0  --    Lipid Panel:  Recent Labs  Lab 10/18/20 0554  CHOL 166  TRIG 66  HDL 43  CHOLHDL 3.9  VLDL 13  LDLCALC 110*   HgbA1c: No results for input(s): HGBA1C in the last 168 hours. Urine Drug Screen:  Recent Labs  Lab 10/17/20 2322  LABOPIA NONE DETECTED  COCAINSCRNUR NONE DETECTED  LABBENZ NONE DETECTED  AMPHETMU NONE DETECTED  THCU NONE DETECTED  LABBARB NONE DETECTED    Alcohol Level  Recent Labs  Lab 10/17/20 2322  ETH <10    IMAGING past 24 hours DG Swallowing Func-Speech Pathology  Result Date: 10/18/2020 Objective Swallowing Evaluation: Type of Study: Bedside Swallow Evaluation  Patient Details Name: Angelica Strickland MRN: [id number] Date of Birth: 03-Jul-1955 Today's Date: 10/18/2020 Time: SLP Start Time (ACUTE ONLY): 0948 -SLP Stop Time (ACUTE ONLY): 1005 SLP Time Calculation (min) (ACUTE ONLY): 17 min Past Medical History: Past Medical History: Diagnosis Date . Atrial  fibrillation ([place]) 11/11/2014 . Carpal tunnel syndrome 11/11/2014 . Congestive heart failure ([place]) 11/11/2014 . COPD (chronic obstructive pulmonary disease) ([place]) 11/11/2014 . Depression 11/11/2014 . Essential hypertension 11/11/2014 . History of pituitary tumor 11/11/2014 . Hypopotassemia 11/11/2014 . Osteoarthritis 11/11/2014 . Thrombocytopenia ([place]) 11/11/2014 Past Surgical History: Past Surgical History: Procedure Laterality Date . CARDIAC CATHETERIZATION   . PITUITARY SURGERY   . UTERINE FIBROID SURGERY   HPI: Pt is a 65 yo adm to [facility] after recent fall, new onset of right side weakness, facial asymmetery and aphasia.  Imaging of brain showed moderately large acute to subacute CVA due to Left M1 occlusion.  Pt also with remote bilateral cerebellar cvas.  Cervical spine imaging showed osteophytic changes C5-C6, C6-C7.  PMH + for pituitary tumor, COPD, CHF, smoking.  .  Pt did not pass [facility].  Subjective: pt awake in chair, sleepier than normal Assessment / Plan / Recommendation CHL IP CLINICAL IMPRESSIONS 10/18/2020 Clinical Impression Pt presents with moderate oral and mild pharyngeal dysphagia. Impaired oral coordination results in delay in transiting, premature spillage and compromised mastication ability *more than just from cervical collar in place.  Pharyngeal swallow c/b pooling of liquids to pyriform sinus for up to 2-3 seconds prior to swallow trigger. Pharyngeal swallow is strong without  retention. Pt did not aspirate or penetrate but with amount of pooling prior to swallow, pt's inability to cough on command and multiple comorbidities increasing her asp pna risk, recommend to initiate a conservative diet of dys1/nectar and allow thin liquids between meals with strict precautions.  Oral suction before and after and medicine crushed with puree.  SLP will follow for dysphagia managment, addressing indication for advancement in diet and compensations for speech/swallow and language.  Thanks for this consult. SLP Visit  Diagnosis Dysphagia, oropharyngeal phase (R13.12) Attention and concentration deficit following -- Frontal lobe and executive function deficit following -- Impact on safety and function Moderate aspiration risk   CHL IP TREATMENT RECOMMENDATION 10/18/2020 Treatment Recommendations Therapy as outlined in treatment plan below   Prognosis 10/18/2020 Prognosis for Safe Diet Advancement Good Barriers to Reach Goals -- Barriers/Prognosis Comment -- CHL IP DIET RECOMMENDATION 10/18/2020 SLP Diet Recommendations Dysphagia 1 (Puree) solids;Nectar thick liquid;Other (Comment) Liquid Administration via Cup;Straw Medication Administration Crushed with puree Compensations Small sips/bites;Slow rate;Lingual sweep for clearance of pocketing Postural Changes Seated upright at 90 degrees;Remain semi-upright after after feeds/meals (Comment)   CHL IP OTHER RECOMMENDATIONS 10/18/2020 Recommended Consults -- Oral Care Recommendations -- Other Recommendations Have oral suction available   CHL IP FOLLOW UP RECOMMENDATIONS 10/18/2020 Follow up Recommendations Skilled Nursing facility   [facility] IP FREQUENCY AND DURATION 10/18/2020 Speech Therapy Frequency (ACUTE ONLY) min 2x/week Treatment Duration 2 weeks      CHL IP ORAL PHASE 10/18/2020 Oral Phase Impaired Oral - Pudding Teaspoon -- Oral - Pudding Cup -- Oral - Honey Teaspoon -- Oral - Honey Cup -- Oral - Nectar Teaspoon Weak lingual manipulation;Delayed oral transit;Premature spillage Oral - Nectar Cup Weak lingual manipulation;Delayed oral transit;Premature spillage Oral - Nectar Straw Delayed oral transit;Weak lingual manipulation;Premature spillage Oral - Thin Teaspoon Weak lingual manipulation;Decreased bolus cohesion;Premature spillage Oral - Thin Cup -- Oral - Thin Straw Weak lingual manipulation;Delayed oral transit;Premature spillage Oral - Puree Delayed oral transit;Weak lingual manipulation Oral - Mech Soft Impaired mastication;Weak lingual manipulation;Delayed oral  transit;Lingual/palatal residue Oral - Regular -- Oral - Multi-Consistency -- Oral - Pill -- Oral Phase - Comment cervical collar in place that may have further exacerbated mastication deficits but obvious oral musculature weakness impacting as well  CHL IP PHARYNGEAL PHASE 10/18/2020 Pharyngeal Phase Impaired Pharyngeal- Pudding Teaspoon -- Pharyngeal -- Pharyngeal- Pudding Cup -- Pharyngeal -- Pharyngeal- Honey Teaspoon -- Pharyngeal -- Pharyngeal- Honey Cup -- Pharyngeal -- Pharyngeal- Nectar Teaspoon Delayed swallow initiation-pyriform sinuses Pharyngeal Material does not enter airway Pharyngeal- Nectar Cup Delayed swallow initiation-pyriform sinuses Pharyngeal Material does not enter airway Pharyngeal- Nectar Straw Delayed swallow initiation-pyriform sinuses Pharyngeal Material does not enter airway Pharyngeal- Thin Teaspoon Delayed swallow initiation-pyriform sinuses Pharyngeal Material does not enter airway Pharyngeal- Thin Cup -- Pharyngeal -- Pharyngeal- Thin Straw Delayed swallow initiation-pyriform sinuses Pharyngeal Material does not enter airway Pharyngeal- Puree Delayed swallow initiation-vallecula Pharyngeal Material does not enter airway Pharyngeal- Mechanical Soft Delayed swallow initiation-vallecula Pharyngeal Material does not enter airway Pharyngeal- Regular -- Pharyngeal -- Pharyngeal- Multi-consistency -- Pharyngeal -- Pharyngeal- Pill -- Pharyngeal -- Pharyngeal Comment pharyngeal swallow is strong albeit delayed  CHL IP CERVICAL ESOPHAGEAL PHASE 10/18/2020 Cervical Esophageal Phase Impaired Pudding Teaspoon -- Pudding Cup -- Honey Teaspoon -- Honey Cup -- Nectar Teaspoon -- Nectar Cup -- Nectar Straw -- Thin Teaspoon -- Thin Cup -- Thin Straw -- Puree -- Mechanical Soft -- Regular -- Multi-consistency -- Pill -- Cervical Esophageal Comment could not view distal cervical spine due to pt's  shoulder elevated and covering view, therefore unable to determine impact of cervical osteophytes on swallow  - however did not view obvious barium retention, Upon esophageal sweep- esophagus appeared clear Kathleen Lime, MS [facility] SLP Acute Rehab Services Office [phone] Pager [phone] [name] 10/18/2020, 10:41 AM              PHYSICAL EXAM General: Elderly female lying in bed with cervical collar intact. NAD. Pulmonary: Normal respiratory effort. Ext: No cyanosis, edema, or deformity Skin: No rash. Normal palpation of skin.   Neurologic Examination  Mental status/Cognition: opens eyes to voice, tracks face on the left. Does not cross midline.  Speech/language: mute, global aphasia. Cranial nerves:   CN II Pupils equal and reactive to light, does not blink to threat on the right.   CN III,IV,VI L gaze deviation, no nystagmus.   CN V    CN VII R Facial droop   CN VIII    CN IX & X    CN XI    CN XII    Motor/sensory:  Muscle bulk: normal, tone flaccid in RUE Unable to do detailed strength testing secondary to aphasia.  Minimal movement in RUE Spontaneously moves BL lower extremities but moves LLE more than RLE. Withdraws BL lower extremities to pain. Hold LUE up against gravity (mimics) and also moving spontaneously.   ASSESSMENT/PLAN Angelica Strickland is a 65 y.o. female with PMH significant for chronic systolic heart failure with most recent echocardiogram in May 2015 showing LVEF less than 20%, paroxysmal atrial fibrillation not on chronic anticoagulation d/t thrombocytopenia, hypertension, COPD,hx of COPD, HTN, depression, Osteoarthritis who was found on the floor with side facial droop, R arm and leg weakness and a left gaze preference and aphasia.  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 Old cerebellar infarcts  CTA head & neck Left M1 occlusion. No flow limiting stenosis or embolic source seen in  the atheromatous cervical carotids. High-grade atheromatous narrowing at the right vertebral origin. 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 Associated mild petechial hemorrhage without frank hemorrhagic transformation. cerebral atrophy with chronic small vessel ischemic disease with multiple remote bilateral cerebellar infarcts.  MRA  Occlusion of the proximal-mid left M1 segment. A small temporal branch remains perfused. Otherwise, no significant flow within the left MCA distribution by MRA. Mild atherosclerotic change elsewhere within the intracranial circulation with no other hemodynamically significant or correctable stenosis.  2D Echo done this am with read Pending  LDL 110  HgbA1c send out, pending  VTE prophylaxis - lovenox  On no antithrombotics prior to admission, now on ASA 324mg . Will recommend eliquis 5-7 days post stroke (6/1-6/3) if neuro remains stable to avoid hemorrhagic conversion.   Therapy recommendations:  CIR  Disposition:  Pending  Hx of thrombocytopenia  This admission PlT 144->130->105  Monitor   Management per primary team  Chronic systolic HF  Hx of EF 16%  This admission, 2D Echo EF 20-25%  Management per primary team  Hypertension  Stable . Long-term BP goal normotensive  Hyperlipidemia  Home meds:  None  LDL 110, not at  goal < 70  Lipitor 40mg  on board   Continue statin at discharge  Blood Glucose Management  HgbA1c pending  send out goal < 7.0  CBGs  SSI  Dysphagia  SLP following  On dsyphagia 1 diet recommended with crushed meds  Other Stroke Risk Factors  Advanced Age >/= 65   Former Cigarette smoker  Hx stroke/TIA  Family hx stroke (Mother)  Congestive heart failure  Other Active Problems  Small Meningioma: follow up as outpatient   COPD  C-collar - MRI C-spine pending to clear   Hospital day # 1  Angelica A  Bailey-Modzik,NP-C   ATTENDING NOTE: I reviewed above note and agree with the assessment and plan. Pt was seen and examined.   No acute event overnight, patient still lying in bed, with c-collar on.  Patient seems more awake alert than yesterday, eyes spontaneously open, however still has global aphasia with only able to open mouth with request but not other commands.  Still has left gaze preference, not blinking to the right, right facial droop and right hemiplegia.  EF 20 to 25%, severe cardiomyopathy, similar to EF 20% in the past.  Patient stroke likely combination from A. fib and CHF with low EF not on anticoagulation.  Currently on aspirin 325, recommend to start Eliquis 5 to 7 days post stroke to avoid risk of hemorrhagic conversion.  Continue Lipitor 40.  Close watch thrombocytopenia.  PT/OT recommend CIR.  Patient status post c-collar, due to aphasia, not able to clear only based on CT C-spine.  Recommend MRI C-spine to clear ligaments.   For detailed assessment and plan, please refer to above as I have made changes wherever appropriate.   Neurology will sign off. Please call with questions. Pt will follow up with stroke clinic NP at [facility] in about 4 weeks. Thanks for the consult.  Rosalin Hawking, MD PhD Stroke Neurology 10/19/2020 5:50 PM   Addendum MRI C-spine showed m prevertebral fluid extending from the craniocervical junction to C6-7 with mild uplifting of the anterior longitudinal ligament (ALL) suggesting mild ligamentous injury in the setting of trauma, visualized ALL fibers appears intact.  Attending physician Dr. Ree Kida discussed with NSG Dr. Sherlyn Lick who feels safe to clear C-collar. C-collar later was removed.     To contact Stroke Continuity provider, please refer to [email or web address] After hours, contact General Neurology

## 2020-10-19 NOTE — Plan of Care (Signed)
Problem: Education: Goal: Knowledge of General Education information will improve Description: Including pain rating scale, medication(s)/side effects and non-pharmacologic comfort measures 10/19/2020 1950 by Laure Kidney, RN Outcome: Progressing 10/19/2020 1948 by Laure Kidney, RN Outcome: Progressing   Problem: Health Behavior/Discharge Planning: Goal: Ability to manage health-related needs will improve 10/19/2020 1950 by Laure Kidney, RN Outcome: Progressing 10/19/2020 1948 by Laure Kidney, RN Outcome: Progressing   Problem: Clinical Measurements: Goal: Ability to maintain clinical measurements within normal limits will improve 10/19/2020 1950 by Laure Kidney, RN Outcome: Progressing 10/19/2020 1948 by Laure Kidney, RN Outcome: Progressing Goal: Will remain free from infection 10/19/2020 1950 by Laure Kidney, RN Outcome: Progressing 10/19/2020 1948 by Laure Kidney, RN Outcome: Progressing Goal: Diagnostic test results will improve 10/19/2020 1950 by Laure Kidney, RN Outcome: Progressing 10/19/2020 1948 by Laure Kidney, RN Outcome: Progressing Goal: Respiratory complications will improve 10/19/2020 1950 by Laure Kidney, RN Outcome: Progressing 10/19/2020 1948 by Laure Kidney, RN Outcome: Progressing Goal: Cardiovascular complication will be avoided 10/19/2020 1950 by Laure Kidney, RN Outcome: Progressing 10/19/2020 1948 by Laure Kidney, RN Outcome: Progressing   Problem: Activity: Goal: Risk for activity intolerance will decrease 10/19/2020 1950 by Laure Kidney, RN Outcome: Progressing 10/19/2020 1948 by Laure Kidney, RN Outcome: Progressing   Problem: Nutrition: Goal: Adequate nutrition will be maintained 10/19/2020 1950 by Laure Kidney, RN Outcome: Progressing 10/19/2020 1948 by Laure Kidney, RN Outcome: Progressing   Problem: Coping: Goal: Level of anxiety will decrease 10/19/2020 1950 by Laure Kidney, RN Outcome:  Progressing 10/19/2020 1948 by Laure Kidney, RN Outcome: Progressing   Problem: Elimination: Goal: Will not experience complications related to urinary retention 10/19/2020 1950 by Laure Kidney, RN Outcome: Progressing 10/19/2020 1948 by Laure Kidney, RN Outcome: Progressing   Problem: Pain Managment: Goal: General experience of comfort will improve 10/19/2020 1950 by Laure Kidney, RN Outcome: Progressing 10/19/2020 1948 by Laure Kidney, RN Outcome: Progressing   Problem: Safety: Goal: Ability to remain free from injury will improve 10/19/2020 1950 by Laure Kidney, RN Outcome: Progressing 10/19/2020 1948 by Laure Kidney, RN Outcome: Progressing   Problem: Skin Integrity: Goal: Risk for impaired skin integrity will decrease 10/19/2020 1950 by Laure Kidney, RN Outcome: Progressing 10/19/2020 1948 by Laure Kidney, RN Outcome: Progressing   Problem: Education: Goal: Knowledge of disease or condition will improve 10/19/2020 1950 by Laure Kidney, RN Outcome: Progressing 10/19/2020 1948 by Laure Kidney, RN Outcome: Progressing Goal: Knowledge of secondary prevention will improve 10/19/2020 1950 by Laure Kidney, RN Outcome: Progressing 10/19/2020 1948 by Laure Kidney, RN Outcome: Progressing Goal: Knowledge of patient specific risk factors addressed and post discharge goals established will improve 10/19/2020 1950 by Laure Kidney, RN Outcome: Progressing 10/19/2020 1948 by Laure Kidney, RN Outcome: Progressing Goal: Individualized Educational Video(s) 10/19/2020 1950 by Laure Kidney, RN Outcome: Progressing 10/19/2020 1948 by Laure Kidney, RN Outcome: Progressing   Problem: Health Behavior/Discharge Planning: Goal: Ability to manage health-related needs will improve 10/19/2020 1950 by Laure Kidney, RN Outcome: Progressing 10/19/2020 1948 by Laure Kidney, RN Outcome: Progressing   Problem: Self-Care: Goal: Ability to participate in self-care as  condition permits will improve 10/19/2020 1950 by Laure Kidney, RN Outcome: Progressing 10/19/2020 1948 by Laure Kidney, RN Outcome: Progressing Goal: Ability to communicate needs accurately will improve 10/19/2020 1950 by Laure Kidney, RN  Outcome: Progressing 10/19/2020 1948 by Laure Kidney, RN Outcome: Progressing   Problem: Nutrition: Goal: Risk of aspiration will decrease 10/19/2020 1950 by Laure Kidney, RN Outcome: Progressing 10/19/2020 1948 by Laure Kidney, RN Outcome: Progressing Goal: Dietary intake will improve 10/19/2020 1950 by Laure Kidney, RN Outcome: Progressing 10/19/2020 1948 by Laure Kidney, RN Outcome: Progressing   Problem: Ischemic Stroke/TIA Tissue Perfusion: Goal: Complications of ischemic stroke/TIA will be minimized 10/19/2020 1950 by Laure Kidney, RN Outcome: Progressing 10/19/2020 1948 by Laure Kidney, RN Outcome: Progressing

## 2020-10-19 NOTE — Consult Note (Signed)
Neurosurgery Consultation  Reason for Consult: Cervical spine injury Referring Physician: Ree Kida  CC: stroke  HPI: This is a 65 y.o. woman w/ h/o CHF EF <20, PAF, thrombocytopenia, COPD, who p/w acute onset R hemiplegia and aphasia, diagnosed with left M1 occlusion. She was found down and came in w/ a C collar in place, was unable to be cleared clinically given aphasia, so MRI was obtained. On my exam, pt has some minimal verbal output but cannot describe symptoms well, per imaging no prior cervical surgery, further history limited due to pt's aphasia.    ROS: A 14 point ROS was performed and is negative except as noted in the HPI.   PMHx:  Past Medical History:  Diagnosis Date  . Atrial fibrillation ([place]) 11/11/2014  . Carpal tunnel syndrome 11/11/2014  . Congestive heart failure ([place]) 11/11/2014  . COPD (chronic obstructive pulmonary disease) ([place]) 11/11/2014  . Depression 11/11/2014  . Essential hypertension 11/11/2014  . History of pituitary tumor 11/11/2014  . Hypopotassemia 11/11/2014  . Osteoarthritis 11/11/2014  . Thrombocytopenia ([place]) 11/11/2014   FamHx:  Family History  Problem Relation Age of Onset  . Hypertension Mother   . Stroke Mother   . Alzheimer's disease Mother   . CAD Father   . Hypertension Father   . Healthy Sister   . Breast cancer Sister   . Healthy Sister    SocHx:  reports that she quit smoking about 10 years ago. She has never used smokeless tobacco. She reports that she does not drink alcohol and does not use drugs.  Exam: Vital signs in last 24 hours: Temp:  [97.5 F (36.4 C)-98.7 F (37.1 C)] 97.7 F (36.5 C) (05/31 0805) Pulse Rate:  [62-102] 83 (05/31 0805) Resp:  [16-18] 18 (05/31 0805) BP: (87-106)/(49-77) 100/49 (05/31 0805) SpO2:  [94 %-100 %] 94 % (05/31 0805) Weight:  [83.6 kg] 83.6 kg (05/31 0353) General: Awake, sitting up in bed in NAD Head: Normocephalic and atruamatic HEENT: Neck supple on passive ROM, able to due active ROM with  cues without any evidence of pain / grimacing, no increase in cervical musculature or change in neurologic function with active and passive ROM Psych: flat affect Cardiac: irregularly irregular Abdomen: S NT ND Extremities: Warm and well perfused x4 Neuro: FC with pantomime, globally aphasic except she does make some sounds to questions, PERRL, gaze neutral, no hoffman's, no clonus FC to pantomime on the R, 3/5 on the left when holding position but not FC   Assessment and Plan: 65 y.o. woman found down w/ L M1 occlusion / MCA syndrome. CT and MRI C-spine personally reviewed. CT C-sp shows degen changes without fracture or subluxation. MRI C-spine shows some prevertabral edema vs fluid, not a clear hematoma, there is some uplifting of the ALL but it appears intact, multi-level degen changes, no cord signal change. Of note, had a CTA head & neck, alignment has been stable on all three studies  -okay to discontinue C-collar and C-spine precautions, no need for scheduled follow up with me unless she has new cervical complaints as her speech returns or change in neurologic function concerning for radiculopathy / myelopathy -please call with any concerns or questions  Judith Part, MD 10/21/20 10:35 AM [facility]

## 2020-10-19 NOTE — Progress Notes (Signed)
*  PRELIMINARY RESULTS* Echocardiogram 2D Echocardiogram has been performed.  Angelica Strickland 10/19/2020, 12:52 PM

## 2020-10-19 NOTE — Progress Notes (Signed)
PROGRESS NOTE    Angelica Strickland  UMP:[id number] DOB: 01-Nov-1955 DOA: 10/17/2020 PCP: Birdie Sons, MD   Brief Narrative:  HPI On 10/18/2020 by Dr. Babs Bertin Angelica Strickland is a 65 y.o. female with medical history significant for chronic systolic heart failure with most recent echocardiogram in May 2015 showing LVEF less than 20%, paroxysmal atrial fibrillation not on chronic anticoagulation in the setting of thrombocytopenia, hypertension, COPD, who is admitted to [facility] on 10/17/2020 with acute ischemic stroke after presenting from home to [facility] ED via EMS for evaluation of right facial droop and right-sided weakness.  In the context of the patient's current expressive aphasia, the following history of the patient's history, addition to my discussions with the emergency department physician and via chart review.  Per the patient's sister, she conveys that she most recently spoke to the patient over the phone at approximately 1400 on 10/17/2020, at which time the patient was in her normal state of health, and without acute complaint.  A few hours later, patient's sister attempted to contact the patient again, who was unable to reach her, prompting family members to go to the patient's, and upon no response to their knocking on the door, were able to gain entrance into the house at which time they found the patient laying on the floor, with evidence of presenting weakness as well as expressive aphasia.  At that time, they contacted EMS who brought patient over to [facility] emergency department for further evaluation of the above, with patient noted to arrive at [facility] ED on 2320 on 10/17/2020.  In terms of modifiable CV risk factors, per chart review, it appears that the patient has a history of hypertension, for which she is prescribed Coreg as well as lisinopril.  She also has a history of paroxysmal atrial fibrillation for which she is not chronically anticoagulated in the setting of  a documented history of thrombocytopenia.  Rather, she has been taking a daily full dose aspirin at home, but no additional blood thinning agents.  No known history of underlying hyperlipidemia or diabetes.  Pressure review, she is a former smoker, having completely quit smoking approximately 10 years ago after smoking less than 1 pack/day for 4 to 5 years.  No known history of underlying obstructive sleep apnea.  Not on any lipid-lowering medications as an outpatient, including no statin.   Per chart review, most recent echocardiogram was performed in May 2015 and showed LVEF less than 20%, moderately dilated left ventricular cavity size, mildly dilated left atrium, moderately dilated right atrium, moderate to severe mitral regurgitation, moderate to severe tricuspid regurgitation.  Patient's home diuretic regimen consists of torsemide 20 mg p.o. daily.   Interim history Admitted with CVA. Work up in progress.  Assessment & Plan   Acute ischemic CVA -Patient presented with right facial droop and right-sided weakness as well as expressive aphasia -CT head showed evolving left MCA infarct with hyperdense proximal left MCA -CTA head and neck showed left M1 occlusion, no flow-limiting stenosis or embolic source seen in the atheromatous cervical carotids.  High-grade atheromatous narrowing at the right vertebral origin. -MRI brain: Moderately large acute to early subacute left MCA distribution infarct, associated mild petechial hemorrhage without frank hemorrhagic transformation.  No significant mass-effect. -MRA head showed occlusion of the proximal mid left M1 segment mild atherosclerotic change elsewhere within the intracranial circulation with no hemodynamically significant or correctable stenosis. -Echocardiogram pending -LDL 110, hemoglobin A1c still pending -Continue aspirin.  -will  start statin therapy today  -PT and OT recommending CIR -Speech therapy consulted and recommended dysphagia 1  diet, nectar thick liquids -Neurology consulted and appreciated-recommending to start Eliquis on October 22, 2020, 5 days post stroke if patient remains stable -Discussed with Dr. Erlinda Hong, recommended obtaining MRI of cervical spine as patient currently has cervical collar in place to assess for any type of injury or trauma  Essential hypertension -Given acute CVA, allow for permissive hypertension-treat systolic blood pressures of greater than 341 or diastolic pressures are greater than 110  COPD -Currently stable, continue albuterol as needed  Chronic systolic heart failure -Last echocardiogram in May 2015 shows an EF of less than 20% -Currently appears to be euvolemic and compensated -Lisinopril and Coreg, torsemide held for now to allow for permissive hypertension -Repeat echocardiogram pending  Paroxysmal atrial fibrillation -Currently irregular irregular -CHA2DS2-VASc of 6 -Patient was not on any anticoagulation given history of thrombocytopenia -If platelets remain stable, will start Eliquis on 10/22/2020  Thrombocytopenia -Chronic, platelets currently 105 -continue to monitor CBC  Chronic kidney disease, stage IIIa -Creatinine stable, continue to monitor BMP  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   Difficult to place patient No   Consultants Neurology  Inpatient rehab  Procedures  None  Antibiotics   Anti-infectives (From admission, onward)   None      Subjective:    Angelica Strickland seen and examined today.  Minimal interaction, and aphasic. Objective:   Vitals:   10/18/20 2347 10/19/20 0307 10/19/20 0334 10/19/20 0822  BP: 95/62 (!) 83/57 (S) (!) 99/58 (!) 92/53  Pulse: 87 81  85  Resp: 18 19  19   Temp: 99.2 F (37.3 C) 98 F (36.7 C)  98.3 F (36.8 C)  TempSrc: Oral Oral  Oral  SpO2: 100% 100%  100%    Intake/Output Summary (Last 24 hours) at 10/19/2020 1008 Last data filed at 10/19/2020 0648 Gross per 24 hour  Intake 1499.16 ml  Output 1305 ml  Net 194.16 ml   There were no vitals filed for this visit.  Exam  General: Well developed, well nourished, NAD, appears stated age  HEENT: NCAT, mucous membranes moist.   Neck: Cervical neck collar  Cardiovascular: S1 S2 auscultated, irregular, 2/6 SEM.  Respiratory: Clear to auscultation bilaterally, no wheezing  Abdomen: Soft, nontender, nondistended, + bowel sounds  Extremities: warm dry without cyanosis clubbing or edema  Neuro: Awake and alert, however cannot assess orientation at this time.  Has spontaneous movement of the lower extremities more on the left than the right.  Right upper extremity flaccid tone.  Right facial droop. Left gaze deviation.  Aphasic.  Skin: Without rashes exudates or nodules  Psych: cannot assess at this time   Data Reviewed: I have personally reviewed following labs and imaging studies  CBC: Recent Labs  Lab 10/17/20 2322 10/18/20 0554 10/19/20 0358  WBC 7.3 7.1 6.1  NEUTROABS 4.9  --   --   HGB 13.4 12.8 11.3*  HCT 40.5 38.7 34.7*  MCV 99.0 98.7 100.9*  PLT 144* 130* 628*   Basic Metabolic Panel: Recent Labs  Lab 10/17/20 2322 10/18/20 0554 10/19/20 0358  NA 135 138 139  K 4.4 4.3 4.0  CL 104 108 113*  CO2 23 23 18*  GLUCOSE 103* 89 92  BUN 28* 25* 20  CREATININE 1.21* 1.10* 0.96  CALCIUM 9.2 8.9 8.5*  MG  --  2.0  --    GFR: CrCl cannot be calculated (Unknown ideal weight.). Liver Function Tests: Recent Labs  Lab  10/17/20 2322  AST 17  ALT 10  ALKPHOS 51  BILITOT 1.3*  PROT 6.7  ALBUMIN 3.8   No results for input(s): LIPASE, AMYLASE in the last 168 hours. No results for input(s): AMMONIA in the last 168 hours. Coagulation Profile: Recent Labs  Lab 10/17/20 2322  INR 1.2   Cardiac Enzymes: No results for input(s): CKTOTAL, CKMB, CKMBINDEX, TROPONINI in the last 168 hours. BNP (last 3 results) No results for input(s): PROBNP in the last 8760 hours. HbA1C: No results for input(s): HGBA1C in the last 72 hours. CBG: No results for input(s): GLUCAP in the last 168 hours. Lipid Profile: Recent Labs    10/18/20 0554  CHOL 166  HDL 43  LDLCALC 110*  TRIG 66  CHOLHDL 3.9   Thyroid Function Tests: No results for input(s): TSH, T4TOTAL, FREET4, T3FREE, THYROIDAB in the last 72 hours. Anemia Panel: No results for input(s): VITAMINB12, FOLATE, FERRITIN, TIBC, IRON, RETICCTPCT in the last 72 hours. Urine analysis:    Component Value Date/Time   COLORURINE YELLOW 10/17/2020 2324   APPEARANCEUR CLEAR 10/17/2020 2324   APPEARANCEUR Clear 10/11/2013 1747   LABSPEC 1.015 10/17/2020 2324   LABSPEC 1.008 10/11/2013 1747   PHURINE 5.0 10/17/2020 2324   GLUCOSEU NEGATIVE 10/17/2020 2324   GLUCOSEU Negative 10/11/2013 1747   HGBUR NEGATIVE 10/17/2020 2324   BILIRUBINUR NEGATIVE 10/17/2020 2324   BILIRUBINUR Negative 10/11/2013 1747   KETONESUR NEGATIVE 10/17/2020 2324   PROTEINUR NEGATIVE 10/17/2020 2324   NITRITE NEGATIVE 10/17/2020 2324   LEUKOCYTESUR NEGATIVE 10/17/2020 2324   LEUKOCYTESUR 2+ 10/11/2013 1747   Sepsis Labs: @LABRCNTIP (procalcitonin:4,lacticidven:4)  ) Recent Results (from the past 240 hour(s))  Resp Panel by RT-PCR (Flu A&B, Covid) Urine, Catheterized     Status: None   Collection Time: 10/17/20 11:24 PM   Specimen: Urine, Catheterized; Nasopharyngeal(NP) swabs in vial transport medium  Result Value Ref Range Status   SARS Coronavirus 2 by RT PCR NEGATIVE NEGATIVE  Final    Comment: (NOTE) SARS-CoV-2 target nucleic acids are NOT DETECTED.  The SARS-CoV-2 RNA is generally detectable in upper respiratory specimens during the acute phase of infection. The lowest concentration of SARS-CoV-2 viral copies this assay can detect is 138 copies/mL. A negative result does not preclude SARS-Cov-2 infection and should not be used as the sole basis for treatment or other patient management decisions. A negative result may occur with  improper specimen collection/handling, submission of specimen other than nasopharyngeal swab, presence of viral mutation(s) within the areas targeted by this assay, and inadequate number of viral copies(<138 copies/mL). A negative result must be combined with clinical observations, patient history, and epidemiological information. The expected result is Negative.  Fact Sheet for Patients:  EntrepreneurPulse.com.au  Fact Sheet for Healthcare Providers:  IncredibleEmployment.be  This test is no t yet approved or cleared by the [place] and  has been authorized for detection and/or diagnosis of SARS-CoV-2 by FDA under an Emergency Use Authorization (EUA). This EUA will remain  in effect (meaning this test can be used) for the duration of the COVID-19 declaration under Section 564(b)(1) of the Act, 21 U.S.C.section 360bbb-3(b)(1), unless the authorization is terminated  or revoked sooner.       Influenza A by PCR NEGATIVE NEGATIVE Final   Influenza B by PCR NEGATIVE NEGATIVE Final    Comment: (NOTE) The Xpert Xpress SARS-CoV-2/FLU/RSV plus assay is intended as an aid in the diagnosis of influenza from Nasopharyngeal swab specimens and should not be used as a sole basis for treatment. Nasal washings and aspirates are unacceptable for Xpert Xpress SARS-CoV-2/FLU/RSV testing.  Fact Sheet for Patients: EntrepreneurPulse.com.au  Fact Sheet for Healthcare  Providers: IncredibleEmployment.be  This test is not yet approved or cleared by the [place] FDA and has been authorized for detection and/or diagnosis of SARS-CoV-2 by FDA under an Emergency Use Authorization (EUA). This EUA will remain in effect (meaning this test can be used) for the duration of the COVID-19 declaration under Section 564(b)(1) of the Act, 21 U.S.C. section 360bbb-3(b)(1), unless the authorization is terminated or revoked.  Performed at [facility], [place] [address], [place], [place] [address]       Radiology Studies: CT ANGIO HEAD NECK W WO CM  Result Date: 10/18/2020 CLINICAL DATA:  Stroke follow-up EXAM: CT ANGIOGRAPHY HEAD AND NECK TECHNIQUE: Multidetector CT imaging of the head and neck was performed using the standard protocol during bolus administration of intravenous contrast. Multiplanar CT image reconstructions and MIPs were obtained to evaluate the vascular anatomy. Carotid stenosis measurements (when applicable) are obtained utilizing NASCET criteria, using the distal internal carotid diameter as the denominator. CONTRAST:  59mL OMNIPAQUE IOHEXOL 350 MG/ML SOLN COMPARISON:  Brain MRI from earlier the same day FINDINGS: CTA NECK FINDINGS Aortic arch: Atheromatous plaque.  Two vessel branching. Right carotid system: Mainly low-density plaque at the bifurcation without ulceration or flow limiting stenosis. Left carotid system: Mainly low-density plaque at the distal common carotid without flow limiting stenosis or ulceration. Vertebral arteries: Proximal subclavian atherosclerosis without flow limiting stenosis. Mixed density plaque at the right V1 segment causes high-grade narrowing. No vertebral dissection or beading. Skeleton: Mandibular cavity and periapical erosion on the right Other neck: Thyroid atrophy. Upper chest: Partial coverage of large main pulmonary artery measuring at least 3.8 cm, consistent with pulmonary hypertension.  Review of the MIP images confirms the above findings CTA HEAD FINDINGS Anterior circulation: Atheromatous plaque along the carotid siphons. Left M1 occlusion with limited reconstitution in the arterial phase. Fenestrated appearance to the right M1 segment. Negative for aneurysm. Generalized atheromatous changes to medium size vessels. Posterior circulation: Atheromatous calcification involving the right vertebral artery. No PCA occlusion or flow limiting stenosis. Fetal type right PCA flow. Venous sinuses: Unremarkable in the arterial phase Anatomic variants: As above High-density along the planum sphenoidale which appears dural based, up to 4 mm in thickness and 14 mm in diameter. There is hyperostosis of the underlying bone. Review of the MIP images confirms the above findings IMPRESSION: 1. Left M1 occlusion. 2. No flow limiting stenosis or embolic source seen in the atheromatous cervical carotids. 3. High-grade atheromatous narrowing at the right vertebral origin. 4. 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 After convalescence a postcontrast brain MRI can be obtained. Electronically Signed   By: Monte Fantasia M.D.   On: 10/18/2020 07:02   CT HEAD WO CONTRAST  Result Date: 10/18/2020 CLINICAL DATA:  Recent fall with right-sided facial droop and right-sided weakness, initial encounter EXAM: CT HEAD WITHOUT CONTRAST CT CERVICAL SPINE WITHOUT CONTRAST TECHNIQUE: Multidetector CT imaging of the head and cervical spine was performed following the standard protocol without intravenous contrast. Multiplanar CT image reconstructions of the cervical spine were also generated. COMPARISON:  09/20/2010 FINDINGS: CT HEAD FINDINGS Brain: Geographic area of decreased attenuation is identified in the distribution of the left middle cerebral artery consistent with acute to subacute infarct. This is new from the prior exam. Scattered cerebellar hypodensities are noted consistent with prior infarcts. No hemorrhage is  seen. No abnormal mass is noted. Vascular: Mild hyperdensity is noted in the left middle cerebral artery likely representing thrombosis. Skull: Normal. Negative for fracture or focal lesion. Sinuses/Orbits: No acute finding. Other: None. CT CERVICAL SPINE FINDINGS Alignment: Within normal limits. Skull base and vertebrae: 7 cervical segments are well visualized. Vertebral body height is well maintained. Osteophytic changes are noted at C5-6 and C6-7 with mild disc space narrowing. No significant facet hypertrophic changes are noted. No acute fracture is seen. Soft tissues and spinal canal: Surrounding soft tissue structures are within normal limits. Upper chest: Visualized lung apices are within normal limits. Other: None IMPRESSION: CT of the head: Evolving left MCA infarct with a hyperdense proximal left MCA. Old cerebellar infarcts. CT of the cervical spine: Degenerative change without acute abnormality. Critical Value/emergent results were called by telephone at the time of interpretation on 10/18/2020 at 12:01 am to Dr. Thayer Jew , who verbally acknowledged these results. Electronically Signed   By: Inez Catalina M.D.   On: 10/18/2020 00:02   CT CERVICAL SPINE WO CONTRAST  Result Date: 10/18/2020 CLINICAL DATA:  Recent fall with right-sided facial droop and right-sided weakness, initial encounter EXAM: CT HEAD WITHOUT CONTRAST CT CERVICAL SPINE WITHOUT CONTRAST TECHNIQUE: Multidetector CT imaging of the head and cervical spine was performed following the standard protocol without intravenous contrast. Multiplanar CT image reconstructions of the cervical spine were also generated. COMPARISON:  09/20/2010 FINDINGS: CT HEAD FINDINGS Brain: Geographic area of decreased attenuation is identified in the distribution of the left middle cerebral artery consistent with acute to subacute infarct. This is new from the prior exam. Scattered cerebellar hypodensities are noted consistent with prior infarcts. No  hemorrhage is seen. No abnormal mass is noted. Vascular: Mild hyperdensity is noted in the left middle cerebral artery likely representing thrombosis. Skull: Normal. Negative for fracture or focal lesion. Sinuses/Orbits: No acute finding. Other: None. CT CERVICAL SPINE FINDINGS Alignment: Within normal limits. Skull base and vertebrae: 7 cervical segments are well visualized. Vertebral body height is well maintained. Osteophytic changes are noted at C5-6 and C6-7 with mild disc space narrowing. No significant facet hypertrophic changes are noted. No acute fracture is seen. Soft tissues and spinal canal: Surrounding soft tissue structures are within normal limits. Upper chest: Visualized lung apices are within normal limits. Other: None IMPRESSION: CT of the head: Evolving left MCA infarct with a hyperdense proximal left MCA. Old cerebellar infarcts. CT of the cervical spine: Degenerative change without acute abnormality. Critical Value/emergent results were called by telephone at the time of interpretation on 10/18/2020 at 12:01 am to Dr. Thayer Jew , who verbally acknowledged these results. Electronically Signed   By: Inez Catalina M.D.   On: 10/18/2020 00:02   MR ANGIO HEAD WO CONTRAST  Result Date: 10/18/2020 CLINICAL DATA:  Initial evaluation for acute stroke. EXAM: MRI HEAD WITHOUT CONTRAST MRA HEAD  WITHOUT CONTRAST TECHNIQUE: Multiplanar, multi-echo pulse sequences of the brain and surrounding structures were acquired without intravenous contrast. Angiographic images of the Circle of Willis were acquired using MRA technique without intravenous contrast. COMPARISON: No pertinent prior exam. COMPARISON:  Prior CT from 10/17/2020 FINDINGS: MRI HEAD FINDINGS Brain: Examination mildly degraded by motion artifact. Diffuse prominence of the CSF containing spaces compatible generalized age-related cerebral atrophy. Patchy and confluent T2/FLAIR hyperintensity within the periventricular and deep white matter  both cerebral hemispheres most consistent with chronic small vessel ischemic disease. Remote lacunar infarct at the anterior genu of the corpus callosum. Few remote lacunar infarcts about the pons. Multiple bilateral cerebellar infarcts noted, right greater than left. Moderately large confluent area of restricted diffusion involving the left frontal and temporal regions consistent with an acute to early subacute left MCA distribution infarct. Involvement of the left insula, overlying left frontal operculum, left basal ganglia, and mid-posterior left temporal lobe. Mild petechial hemorrhage at the level of the left lentiform nucleus without frank hemorrhagic transformation. Minimal effacement of the left lateral ventricle without significant regional mass effect or midline shift at this time. No other evidence for acute or subacute ischemia. Gray-white matter differentiation otherwise maintained. No other evidence for acute or chronic intracranial hemorrhage. No mass lesion. No hydrocephalus or extra-axial fluid collection. No made of an empty sella. Midline structures intact. Vascular: Attenuated flow within the left MCA distribution. Major intracranial vascular flow voids are otherwise maintained. Skull and upper cervical spine: Craniocervical junction within normal limits. Bone marrow signal intensity within normal limits. Mild edema noted at the scalp vertex. Sinuses/Orbits: Globes and orbital soft tissues demonstrate no acute finding. Mild scattered mucosal thickening noted within the ethmoidal air cells and maxillary sinuses. Paranasal sinuses are otherwise clear. Small right mastoid effusion noted, of doubtful significance. Inner ear structures grossly normal. Other: None. MRA HEAD FINDINGS ANTERIOR CIRCULATION: Examination mildly degraded by motion artifact. Distal cervical segments of the internal carotid arteries are patent with antegrade flow. Petrous segments patent bilaterally. Mild atheromatous  irregularity within the carotid siphons without hemodynamically significant stenosis. A1 segments patent bilaterally. Normal anterior communicating artery complex. Anterior cerebral arteries patent to their distal aspects without stenosis. There is occlusion of the proximal-mid left M1 segment. A small temporal branch remains perfused. Otherwise, no significant flow seen within the left MCA distribution by MRA. Right M1 segment patent without stenosis. Right MCA bifurcation grossly within normal limits, although evaluation somewhat limited by motion. Distal right MCA branches well perfused. POSTERIOR CIRCULATION: Both V4 segments patent to the vertebrobasilar junction without stenosis. Right vertebral artery slightly dominant. Right PICA patent. Left PICA not seen. Basilar widely patent proximally. Short-segment mild stenosis at the distal basilar artery noted (series 1036, image 14). Superior cerebellar arteries patent bilaterally. Left PCA supplied via the basilar as well as a small left posterior communicating artery. Predominant fetal type origin of the right PCA. Both PCAs well perfused or distal aspects without stenosis. No aneurysm. IMPRESSION: MRI HEAD IMPRESSION: 1. Moderately large acute to early subacute left MCA distribution infarct as above. Associated mild petechial hemorrhage without frank hemorrhagic transformation. No significant mass effect at this time. 2. Underlying age-related cerebral atrophy with chronic small vessel ischemic disease with multiple remote bilateral cerebellar infarcts. MRA HEAD IMPRESSION: 1. Occlusion of the proximal-mid left M1 segment. A small temporal branch remains perfused. Otherwise, no significant flow within the left MCA distribution by MRA. 2. Mild atherosclerotic change elsewhere within the intracranial circulation with no other hemodynamically significant or correctable  stenosis. Electronically Signed   By: Jeannine Boga M.D.   On: 10/18/2020 03:01   MR  BRAIN WO CONTRAST  Result Date: 10/18/2020 CLINICAL DATA:  Initial evaluation for acute stroke. EXAM: MRI HEAD WITHOUT CONTRAST MRA HEAD WITHOUT CONTRAST TECHNIQUE: Multiplanar, multi-echo pulse sequences of the brain and surrounding structures were acquired without intravenous contrast. Angiographic images of the Circle of Willis were acquired using MRA technique without intravenous contrast. COMPARISON: No pertinent prior exam. COMPARISON:  Prior CT from 10/17/2020 FINDINGS: MRI HEAD FINDINGS Brain: Examination mildly degraded by motion artifact. Diffuse prominence of the CSF containing spaces compatible generalized age-related cerebral atrophy. Patchy and confluent T2/FLAIR hyperintensity within the periventricular and deep white matter both cerebral hemispheres most consistent with chronic small vessel ischemic disease. Remote lacunar infarct at the anterior genu of the corpus callosum. Few remote lacunar infarcts about the pons. Multiple bilateral cerebellar infarcts noted, right greater than left. Moderately large confluent area of restricted diffusion involving the left frontal and temporal regions consistent with an acute to early subacute left MCA distribution infarct. Involvement of the left insula, overlying left frontal operculum, left basal ganglia, and mid-posterior left temporal lobe. Mild petechial hemorrhage at the level of the left lentiform nucleus without frank hemorrhagic transformation. Minimal effacement of the left lateral ventricle without significant regional mass effect or midline shift at this time. No other evidence for acute or subacute ischemia. Gray-white matter differentiation otherwise maintained. No other evidence for acute or chronic intracranial hemorrhage. No mass lesion. No hydrocephalus or extra-axial fluid collection. No made of an empty sella. Midline structures intact. Vascular: Attenuated flow within the left MCA distribution. Major intracranial vascular flow voids are  otherwise maintained. Skull and upper cervical spine: Craniocervical junction within normal limits. Bone marrow signal intensity within normal limits. Mild edema noted at the scalp vertex. Sinuses/Orbits: Globes and orbital soft tissues demonstrate no acute finding. Mild scattered mucosal thickening noted within the ethmoidal air cells and maxillary sinuses. Paranasal sinuses are otherwise clear. Small right mastoid effusion noted, of doubtful significance. Inner ear structures grossly normal. Other: None. MRA HEAD FINDINGS ANTERIOR CIRCULATION: Examination mildly degraded by motion artifact. Distal cervical segments of the internal carotid arteries are patent with antegrade flow. Petrous segments patent bilaterally. Mild atheromatous irregularity within the carotid siphons without hemodynamically significant stenosis. A1 segments patent bilaterally. Normal anterior communicating artery complex. Anterior cerebral arteries patent to their distal aspects without stenosis. There is occlusion of the proximal-mid left M1 segment. A small temporal branch remains perfused. Otherwise, no significant flow seen within the left MCA distribution by MRA. Right M1 segment patent without stenosis. Right MCA bifurcation grossly within normal limits, although evaluation somewhat limited by motion. Distal right MCA branches well perfused. POSTERIOR CIRCULATION: Both V4 segments patent to the vertebrobasilar junction without stenosis. Right vertebral artery slightly dominant. Right PICA patent. Left PICA not seen. Basilar widely patent proximally. Short-segment mild stenosis at the distal basilar artery noted (series [id number], image 14). Superior cerebellar arteries patent bilaterally. Left PCA supplied via the basilar as well as a small left posterior communicating artery. Predominant fetal type origin of the right PCA. Both PCAs well perfused or distal aspects without stenosis. No aneurysm. IMPRESSION: MRI HEAD IMPRESSION: 1. Moderately  large acute to early subacute left MCA distribution infarct as above. Associated mild petechial hemorrhage without frank hemorrhagic transformation. No significant mass effect at this time. 2. Underlying age-related cerebral atrophy with chronic small vessel ischemic disease with multiple remote bilateral cerebellar infarcts. MRA HEAD IMPRESSION:  1. Occlusion of the proximal-mid left M1 segment. A small temporal branch remains perfused. Otherwise, no significant flow within the left MCA distribution by MRA. 2. Mild atherosclerotic change elsewhere within the intracranial circulation with no other hemodynamically significant or correctable stenosis. Electronically Signed   By: Jeannine Boga M.D.   On: 10/18/2020 03:01   DG Swallowing Func-Speech Pathology  Result Date: 10/18/2020 Objective Swallowing Evaluation: Type of Study: Bedside Swallow Evaluation  Patient Details Name: GLORIA RICARDO MRN: [id number] Date of Birth: 02/23/1956 Today's Date: 10/18/2020 Time: SLP Start Time (ACUTE ONLY): 0948 -SLP Stop Time (ACUTE ONLY): 1005 SLP Time Calculation (min) (ACUTE ONLY): 17 min Past Medical History: Past Medical History: Diagnosis Date . Atrial fibrillation ([place]) 11/11/2014 . Carpal tunnel syndrome 11/11/2014 . Congestive heart failure ([place]) 11/11/2014 . COPD (chronic obstructive pulmonary disease) ([place]) 11/11/2014 . Depression 11/11/2014 . Essential hypertension 11/11/2014 . History of pituitary tumor 11/11/2014 . Hypopotassemia 11/11/2014 . Osteoarthritis 11/11/2014 . Thrombocytopenia ([place]) 11/11/2014 Past Surgical History: Past Surgical History: Procedure Laterality Date . CARDIAC CATHETERIZATION   . PITUITARY SURGERY   . UTERINE FIBROID SURGERY   HPI: Pt is a 65 yo adm to [facility] after recent fall, new onset of right side weakness, facial asymmetery and aphasia.  Imaging of brain showed moderately large acute to subacute CVA due to Left M1 occlusion.  Pt also with remote bilateral cerebellar cvas.  Cervical spine imaging  showed osteophytic changes C5-C6, C6-C7.  PMH + for pituitary tumor, COPD, CHF, smoking.  .  Pt did not pass [facility].  Subjective: pt awake in chair, sleepier than normal Assessment / Plan / Recommendation CH[facility] IP CLINICAL IMPRESSIONS 10/18/2020 Clinical Impression Pt presents with moderate oral and mild pharyngeal dysphagia. Impaired oral coordination results in delay in transiting, premature spillage and compromised mastication ability *more than just from cervical collar in place.  Pharyngeal swallow c/b pooling of liquids to pyriform sinus for up to 2-3 seconds prior to swallow trigger. Pharyngeal swallow is strong without retention. Pt did not aspirate or penetrate but with amount of pooling prior to swallow, pt's inability to cough on command and multiple comorbidities increasing her asp pna risk, recommend to initiate a conservative diet of dys1/nectar and allow thin liquids between meals with strict precautions.  Oral suction before and after and medicine crushed with puree.  SLP will follow for dysphagia managment, addressing indication for advancement in diet and compensations for speech/swallow and language.  Thanks for this consult. SLP Visit Diagnosis Dysphagia, oropharyngeal phase (R13.12) Attention and concentration deficit following -- Frontal lobe and executive function deficit following -- Impact on safety and function Moderate aspiration risk   CH[facility] IP TREATMENT RECOMMENDATION 10/18/2020 Treatment Recommendations Therapy as outlined in treatment plan below   Prognosis 10/18/2020 Prognosis for Safe Diet Advancement Good Barriers to Reach Goals -- Barriers/Prognosis Comment -- [facility] IP DIET RECOMMENDATION 10/18/2020 SLP Diet Recommendations Dysphagia 1 (Puree) solids;Nectar thick liquid;Other (Comment) Liquid Administration via Cup;Straw Medication Administration Crushed with puree Compensations Small sips/bites;Slow rate;Lingual sweep for clearance of pocketing Postural Changes Seated upright at 90  degrees;Remain semi-upright after after feeds/meals (Comment)   [facility] IP OTHER RECOMMENDATIONS 10/18/2020 Recommended Consults -- Oral Care Recommendations -- Other Recommendations Have oral suction available   [facility] IP FOLLOW UP RECOMMENDATIONS 10/18/2020 Follow up Recommendations Skilled Nursing facility   [facility] IP FREQUENCY AND DURATION 10/18/2020 Speech Therapy Frequency (ACUTE ONLY) min 2x/week Treatment Duration 2 weeks      CHL IP ORAL PHASE 10/18/2020 Oral Phase Impaired Oral - Pudding  Teaspoon -- Oral - Pudding Cup -- Oral - Honey Teaspoon -- Oral - Honey Cup -- Oral - Nectar Teaspoon Weak lingual manipulation;Delayed oral transit;Premature spillage Oral - Nectar Cup Weak lingual manipulation;Delayed oral transit;Premature spillage Oral - Nectar Straw Delayed oral transit;Weak lingual manipulation;Premature spillage Oral - Thin Teaspoon Weak lingual manipulation;Decreased bolus cohesion;Premature spillage Oral - Thin Cup -- Oral - Thin Straw Weak lingual manipulation;Delayed oral transit;Premature spillage Oral - Puree Delayed oral transit;Weak lingual manipulation Oral - Mech Soft Impaired mastication;Weak lingual manipulation;Delayed oral transit;Lingual/palatal residue Oral - Regular -- Oral - Multi-Consistency -- Oral - Pill -- Oral Phase - Comment cervical collar in place that may have further exacerbated mastication deficits but obvious oral musculature weakness impacting as well  CHL IP PHARYNGEAL PHASE 10/18/2020 Pharyngeal Phase Impaired Pharyngeal- Pudding Teaspoon -- Pharyngeal -- Pharyngeal- Pudding Cup -- Pharyngeal -- Pharyngeal- Honey Teaspoon -- Pharyngeal -- Pharyngeal- Honey Cup -- Pharyngeal -- Pharyngeal- Nectar Teaspoon Delayed swallow initiation-pyriform sinuses Pharyngeal Material does not enter airway Pharyngeal- Nectar Cup Delayed swallow initiation-pyriform sinuses Pharyngeal Material does not enter airway Pharyngeal- Nectar Straw Delayed swallow initiation-pyriform sinuses Pharyngeal  Material does not enter airway Pharyngeal- Thin Teaspoon Delayed swallow initiation-pyriform sinuses Pharyngeal Material does not enter airway Pharyngeal- Thin Cup -- Pharyngeal -- Pharyngeal- Thin Straw Delayed swallow initiation-pyriform sinuses Pharyngeal Material does not enter airway Pharyngeal- Puree Delayed swallow initiation-vallecula Pharyngeal Material does not enter airway Pharyngeal- Mechanical Soft Delayed swallow initiation-vallecula Pharyngeal Material does not enter airway Pharyngeal- Regular -- Pharyngeal -- Pharyngeal- Multi-consistency -- Pharyngeal -- Pharyngeal- Pill -- Pharyngeal -- Pharyngeal Comment pharyngeal swallow is strong albeit delayed  CHL IP CERVICAL ESOPHAGEAL PHASE 10/18/2020 Cervical Esophageal Phase Impaired Pudding Teaspoon -- Pudding Cup -- Honey Teaspoon -- Honey Cup -- Nectar Teaspoon -- Nectar Cup -- Nectar Straw -- Thin Teaspoon -- Thin Cup -- Thin Straw -- Puree -- Mechanical Soft -- Regular -- Multi-consistency -- Pill -- Cervical Esophageal Comment could not view distal cervical spine due to pt's shoulder elevated and covering view, therefore unable to determine impact of cervical osteophytes on swallow - however did not view obvious barium retention, Upon esophageal sweep- esophagus appeared clear Kathleen Lime, MS [facility] SLP Acute Rehab Services Office [phone] Pager [phone] [name] 10/18/2020, 10:41 AM                Scheduled Meds: .  stroke: mapping our early stages of recovery book   Does not apply Once  . aspirin  324 mg Oral Daily   Or  . aspirin  300 mg Rectal Daily  . Chlorhexidine Gluconate Cloth  6 each Topical Daily  . enoxaparin (LOVENOX) injection  40 mg Subcutaneous QHS   Continuous Infusions: . sodium chloride 75 mL/hr at 10/19/20 0022     LOS: 1 day   Time Spent in minutes   45 minutes  Jahmya Onofrio D.O. on 10/19/2020 at 10:08 AM  Between 7am to 7pm - Please see pager noted on [email or web address]  After 7pm go to  [email or web address]  And look for the night coverage person covering for me after hours  [facility] Office  [phone]

## 2020-10-20 LAB — CBC
HCT: 33.8 % — ABNORMAL LOW (ref 36.0–46.0)
Hemoglobin: 11.1 g/dL — ABNORMAL LOW (ref 12.0–15.0)
MCH: 33.4 pg (ref 26.0–34.0)
MCHC: 32.8 g/dL (ref 30.0–36.0)
MCV: 101.8 fL — ABNORMAL HIGH (ref 80.0–100.0)
Platelets: 98 10*3/uL — ABNORMAL LOW (ref 150–400)
RBC: 3.32 MIL/uL — ABNORMAL LOW (ref 3.87–5.11)
RDW: 13.2 % (ref 11.5–15.5)
WBC: 7 10*3/uL (ref 4.0–10.5)
nRBC: 0 % (ref 0.0–0.2)

## 2020-10-20 MED ORDER — ORAL CARE MOUTH RINSE
15.0000 mL | Freq: Two times a day (BID) | OROMUCOSAL | Status: DC
  Administered 2020-10-20 – 2020-10-31 (×23): 15 mL via OROMUCOSAL

## 2020-10-20 NOTE — Progress Notes (Signed)
PROGRESS NOTE    Angelica Strickland  TIW:[id number] DOB: 1956/01/09 DOA: 10/17/2020 PCP: Birdie Sons, MD   Brief Narrative:  HPI On 10/18/2020 by Dr. Babs Bertin Angelica Strickland is a 65 y.o. female with medical history significant for chronic systolic heart failure with most recent echocardiogram in May 2015 showing LVEF less than 20%, paroxysmal atrial fibrillation not on chronic anticoagulation in the setting of thrombocytopenia, hypertension, COPD, who is admitted to [facility] on 10/17/2020 with acute ischemic stroke after presenting from home to [facility] ED via EMS for evaluation of right facial droop and right-sided weakness.  In the context of the patient's current expressive aphasia, the following history of the patient's history, addition to my discussions with the emergency department physician and via chart review.  Per the patient's sister, she conveys that she most recently spoke to the patient over the phone at approximately 1400 on 10/17/2020, at which time the patient was in her normal state of health, and without acute complaint.  A few hours later, patient's sister attempted to contact the patient again, who was unable to reach her, prompting family members to go to the patient's, and upon no response to their knocking on the door, were able to gain entrance into the house at which time they found the patient laying on the floor, with evidence of presenting weakness as well as expressive aphasia.  At that time, they contacted EMS who brought patient over to [facility] emergency department for further evaluation of the above, with patient noted to arrive at [facility] ED on 2320 on 10/17/2020.  In terms of modifiable CV risk factors, per chart review, it appears that the patient has a history of hypertension, for which she is prescribed Coreg as well as lisinopril.  She also has a history of paroxysmal atrial fibrillation for which she is not chronically anticoagulated in the setting of  a documented history of thrombocytopenia.  Rather, she has been taking a daily full dose aspirin at home, but no additional blood thinning agents.  No known history of underlying hyperlipidemia or diabetes.  Pressure review, she is a former smoker, having completely quit smoking approximately 10 years ago after smoking less than 1 pack/day for 4 to 5 years.  No known history of underlying obstructive sleep apnea.  Not on any lipid-lowering medications as an outpatient, including no statin.   Per chart review, most recent echocardiogram was performed in May 2015 and showed LVEF less than 20%, moderately dilated left ventricular cavity size, mildly dilated left atrium, moderately dilated right atrium, moderate to severe mitral regurgitation, moderate to severe tricuspid regurgitation.  Patient's home diuretic regimen consists of torsemide 20 mg p.o. daily.   Interim history Admitted with CVA. Pending CIR  Assessment & Plan   Acute ischemic CVA -Patient presented with right facial droop and right-sided weakness as well as expressive aphasia -CT head showed evolving left MCA infarct with hyperdense proximal left MCA -CTA head and neck showed left M1 occlusion, no flow-limiting stenosis or embolic source seen in the atheromatous cervical carotids.  High-grade atheromatous narrowing at the right vertebral origin. -MRI brain: Moderately large acute to early subacute left MCA distribution infarct, associated mild petechial hemorrhage without frank hemorrhagic transformation.  No significant mass-effect. -MRA head showed occlusion of the proximal mid left M1 segment mild atherosclerotic change elsewhere within the intracranial circulation with no hemodynamically significant or correctable stenosis. -Echocardiogram EF of 20 to 25%, severely decreased function, LV demonstrates global hypokinesis, moderately  elevated pulmonary artery pressure.  Severe mitral valve regurgitation.  Moderate to severe aortic valve  regurgitation. -LDL 110, hemoglobin A1c still pending -Continue aspirin and statin -PT and OT recommending CIR -Speech therapy consulted and recommended dysphagia 1 diet, nectar thick liquids -Neurology consulted and appreciated-recommending to start Eliquis on October 22, 2020, 5 days post stroke if patient remains stable  Cervical ligamentous injury -EMS initially placed a cervical collar on patient  -On 5/29, Discussed with Dr. Erlinda Hong, recommended obtaining MRI of cervical spine as patient currently has cervical collar in place to assess for any type of injury or trauma -MRI cervical spine: Small volume prevertebral fluid extending from the craniocervical junction to C6-C7 with mild uplifting of the anterior longitudinal ligament, suggesting mild ligamentous injury.  Multiple level degenerative change, moderate to severe foraminal stenosis left C7-T1, mild canal stenosis C5-C6, general degenerative disc height loss C6-7, multilevel mild foraminal stenosis -Discussed findings with neurosurgery, Dr. Venetia Constable, felt it would be okay to remove cervical collar  Essential hypertension -Given acute CVA, allow for permissive hypertension-treat systolic blood pressures of greater than 253 or diastolic pressures are greater than 110  COPD -Currently stable, continue albuterol as needed  Chronic systolic heart failure -Last echocardiogram in May 2015 shows an EF of less than 20% -Currently appears to be euvolemic and compensated -Lisinopril and Coreg, torsemide held for now to allow for permissive hypertension -Repeat echocardiogram as above  Paroxysmal atrial fibrillation -Currently irregular irregular -CHA2DS2-VASc of 6 -Patient was not on any anticoagulation given history of thrombocytopenia -If platelets remain stable, will start Eliquis on 10/22/2020  Thrombocytopenia -Chronic, platelets currently 98 -continue to monitor CBC  Chronic kidney disease, stage IIIa -Creatinine stable, continue to  monitor BMP  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   Difficult to place patient No   Consultants Neurology  Inpatient rehab  Procedures  Echocardiogram  Antibiotics   Anti-infectives (From admission, onward)   None      Subjective:   Fryda Enochs seen and examined today.  Minimal interaction, and aphasic. Objective:   Vitals:   10/19/20 2340 10/20/20 0341 10/20/20 0803 10/20/20 1117  BP: (!) 92/50 (!) 82/59 (!) 82/55 103/77  Pulse: 71 75 (!) 105 (!) 102  Resp: 18 16 18 16   Temp: 98.9 F (37.2 C) 98.7 F (37.1 C) 97.9 F (36.6 C) 98.3 F (36.8 C)  TempSrc: Oral Oral Oral Oral  SpO2: 100% 100% 99% 100%  Weight:  83.3 kg      Intake/Output Summary (Last 24 hours) at 10/20/2020 1351 Last data filed at 10/20/2020 0600 Gross per 24 hour  Intake 1187.02 ml  Output 350 ml  Net 837.02 ml   Filed Weights   10/20/20 0341  Weight: 83.3 kg    Exam  General: Well developed, ill appearing, NAD  HEENT: NCAT, mucous membranes moist.   Cardiovascular: S1 S2 auscultated, irregular, 2/6 SEM.  Respiratory: Clear to auscultation bilaterally, no wheezing  Abdomen: Soft, nontender, nondistended, + bowel sounds, obese  Extremities: warm dry without cyanosis clubbing or edema  Neuro: Awake and alert, however cannot assess orientation at this time.  Has spontaneous movement of the lower extremities more on the left than the right.  Right upper extremity flaccid tone.   Right facial droop. Left gaze deviation.  Aphasic.   Data Reviewed: I have personally reviewed following labs and imaging studies  CBC: Recent Labs  Lab 10/17/20 2322 10/18/20 0554 10/19/20 0358 10/20/20 0138  WBC 7.3 7.1 6.1 7.0  NEUTROABS 4.9  --   --   --   HGB 13.4 12.8 11.3* 11.1*  HCT 40.5 38.7 34.7* 33.8*  MCV 99.0 98.7 100.9* 101.8*  PLT 144* 130* 105* 98*   Basic Metabolic Panel: Recent Labs  Lab 10/17/20 2322 10/18/20 0554 10/19/20 0358  NA 135 138 139  K 4.4 4.3 4.0  CL 104 108 113*  CO2 23 23 18*  GLUCOSE 103* 89 92  BUN 28* 25* 20  CREATININE 1.21* 1.10* 0.96  CALCIUM 9.2 8.9 8.5*  MG  --  2.0  --    GFR: CrCl cannot be calculated (Unknown ideal weight.). Liver Function Tests: Recent Labs  Lab 10/17/20 2322  AST 17  ALT 10  ALKPHOS 51  BILITOT 1.3*  PROT 6.7  ALBUMIN 3.8   No results for input(s): LIPASE, AMYLASE in the last 168 hours. No results for input(s): AMMONIA in the last 168 hours. Coagulation Profile: Recent Labs  Lab 10/17/20 2322  INR 1.2   Cardiac Enzymes: No results for input(s): CKTOTAL, CKMB, CKMBINDEX, TROPONINI in the last 168 hours. BNP (last 3 results) No results for input(s): PROBNP in the last 8760 hours. HbA1C: No results for input(s): HGBA1C in the last 72 hours. CBG: No results for input(s): GLUCAP in the last 168 hours. Lipid Profile: Recent Labs    10/18/20 0554  CHOL 166  HDL 43  LDLCALC 110*  TRIG 66  CHOLHDL 3.9   Thyroid Function Tests: No results for input(s): TSH, T4TOTAL, FREET4, T3FREE, THYROIDAB in the last 72 hours. Anemia Panel: No results for input(s): VITAMINB12, FOLATE, FERRITIN, TIBC, IRON, RETICCTPCT in the last 72 hours. Urine analysis:    Component Value Date/Time   COLORURINE YELLOW 10/17/2020 2324   APPEARANCEUR CLEAR 10/17/2020 2324   APPEARANCEUR Clear 10/11/2013 1747   LABSPEC 1.015 10/17/2020 2324   LABSPEC 1.008 10/11/2013 1747   PHURINE 5.0 10/17/2020 2324    GLUCOSEU NEGATIVE 10/17/2020 2324   GLUCOSEU Negative 10/11/2013 1747   HGBUR NEGATIVE 10/17/2020 2324   BILIRUBINUR NEGATIVE 10/17/2020 2324   BILIRUBINUR Negative 10/11/2013 1747   KETONESUR NEGATIVE 10/17/2020 2324   PROTEINUR NEGATIVE 10/17/2020 2324   NITRITE NEGATIVE 10/17/2020 2324   LEUKOCYTESUR NEGATIVE 10/17/2020 2324   LEUKOCYTESUR 2+ 10/11/2013 1747   Sepsis Labs: @LABRCNTIP (procalcitonin:4,lacticidven:4)  ) Recent Results (from the past 240 hour(s))  Resp Panel by RT-PCR (Flu A&B, Covid) Urine, Catheterized     Status: None   Collection Time: 10/17/20 11:24 PM   Specimen: Urine, Catheterized; Nasopharyngeal(NP) swabs in vial transport medium  Result Value Ref Range Status   SARS Coronavirus 2 by RT PCR NEGATIVE NEGATIVE Final    Comment: (NOTE) SARS-CoV-2 target nucleic acids are NOT DETECTED.  The SARS-CoV-2 RNA is generally detectable in upper respiratory specimens during the acute phase of infection. The lowest concentration of SARS-CoV-2 viral copies this assay can detect is 138 copies/mL. A negative  result does not preclude SARS-Cov-2 infection and should not be used as the sole basis for treatment or other patient management decisions. A negative result may occur with  improper specimen collection/handling, submission of specimen other than nasopharyngeal swab, presence of viral mutation(s) within the areas targeted by this assay, and inadequate number of viral copies(<138 copies/mL). A negative result must be combined with clinical observations, patient history, and epidemiological information. The expected result is Negative.  Fact Sheet for Patients:  EntrepreneurPulse.com.au  Fact Sheet for Healthcare Providers:  IncredibleEmployment.be  This test is no t yet approved or cleared by the [place] FDA and  has been authorized for detection and/or diagnosis of SARS-CoV-2 by FDA under an Emergency Use Authorization  (EUA). This EUA will remain  in effect (meaning this test can be used) for the duration of the COVID-19 declaration under Section 564(b)(1) of the Act, 21 U.S.C.section 360bbb-3(b)(1), unless the authorization is terminated  or revoked sooner.       Influenza A by PCR NEGATIVE NEGATIVE Final   Influenza B by PCR NEGATIVE NEGATIVE Final    Comment: (NOTE) The Xpert Xpress SARS-CoV-2/FLU/RSV plus assay is intended as an aid in the diagnosis of influenza from Nasopharyngeal swab specimens and should not be used as a sole basis for treatment. Nasal washings and aspirates are unacceptable for Xpert Xpress SARS-CoV-2/FLU/RSV testing.  Fact Sheet for Patients: EntrepreneurPulse.com.au  Fact Sheet for Healthcare Providers: IncredibleEmployment.be  This test is not yet approved or cleared by the [place] FDA and has been authorized for detection and/or diagnosis of SARS-CoV-2 by FDA under an Emergency Use Authorization (EUA). This EUA will remain in effect (meaning this test can be used) for the duration of the COVID-19 declaration under Section 564(b)(1) of the Act, 21 U.S.C. section 360bbb-3(b)(1), unless the authorization is terminated or revoked.  Performed at [facility], [place] [address], [place], [place] [address]   Urine culture     Status: None   Collection Time: 10/17/20 11:24 PM   Specimen: Urine, Catheterized  Result Value Ref Range Status   Specimen Description URINE, CATHETERIZED  Final   Special Requests NONE  Final   Culture   Final    NO GROWTH Performed at [facility], [address] [place], [place] [address]    Report Status 10/19/2020 FINAL  Final      Radiology Studies: MR CERVICAL SPINE WO CONTRAST  Result Date: 10/19/2020 CLINICAL DATA:  Neck trauma. EXAM: MRI CERVICAL SPINE WITHOUT CONTRAST TECHNIQUE: Multiplanar, multisequence MR imaging of the cervical spine was performed. No intravenous contrast  was administered. COMPARISON:  None. FINDINGS: Alignment: Normal. Vertebrae: Vertebral body heights are maintained. No bone marrow edema to suggest acute fracture or discitis/osteomyelitis. Cord: Normal cord signal. Posterior Fossa, vertebral arteries, paraspinal tissues: There is small volume prevertebral fluid which does not suppress on STIR imaging (see series 9 and 11, image 8) with uplifting of the anterior longitudinal ligament (ALL) from the craniocervical junction to approximately C6-C7. ALL fibers appear intact. Apparent T2 hyperintensity at the atlantooccipital joint appears T1 hyperintense and does suppress on STIR imaging, favoring normal fat over edema. Visualized vertebral artery flow voids are maintained. See recent CTA for further evaluation of the neck vasculature. No significant dorsal paraspinal soft tissue edema to dorsal paraspinal strain. The posterior fossa is better evaluated on recent (May 28) MRI head. Disc levels: C2-C3: No significant disc protrusion, foraminal stenosis, or canal stenosis. C3-C4: Small posterior disc osteophyte complex and mild  facet hypertrophy without significant canal or foraminal stenosis. C4-C5: Small posterior disc osteophyte complex and mild right greater than left facet hypertrophy with mild right foraminal stenosis. No significant canal stenosis. C5-C6: Small posterior disc osteophyte complex and mild bilateral uncovertebral hypertrophy with mild bilateral foraminal stenosis. Mild ligamentum flavum infolding with mild canal stenosis. C6-C7: Degenerative disc height loss and posterior disc osteophyte complex with right greater than left facet and uncovertebral hypertrophy. Mild ligamentum flavum infolding. Mild left foraminal stenosis without significant canal stenosis. C7-T1: Left-sided facet uncovertebral hypertrophy with moderate to severe left foraminal stenosis. No significant canal or right foraminal stenosis. IMPRESSION: 1. Small volume prevertebral fluid  extending from the craniocervical junction to C6-C7 with mild uplifting of the anterior longitudinal ligament (ALL), suggestive of mild ligamentous injury in the setting of trauma. Visualized ALL fibers appear intact. 2. No bone marrow edema to suggest acute fracture. 3. Multilevel degenerative change, detailed above and including moderate to severe foraminal stenosis on the left at C7-T1, mild canal stenosis at C5-C6, degenerative disc height loss at C6-C7 and multilevel mild foraminal stenosis. Electronically Signed   By: Margaretha Sheffield MD   On: 10/19/2020 12:20   ECHOCARDIOGRAM COMPLETE  Result Date: 10/19/2020    ECHOCARDIOGRAM REPORT   Patient Name:   SHARLYNE KOENEMAN Date of Exam: 10/19/2020 Medical Rec #:  [id number]       Height:       67.0 in Accession #:    [id number]      Weight:       195.0 lb Date of Birth:  [birth year]        BSA:          2.001 m Patient Age:    46 years        BP:           92/53 mmHg Patient Gender: F               HR:           72 bpm. Exam Location:  Inpatient Procedure: 2D Echo, Cardiac Doppler and Color Doppler Indications:    Stroke I63.9  History:        Patient has prior history of Echocardiogram examinations, most                 recent 09/26/2013. CHF, COPD, Arrythmias:Atrial Fibrillation; Risk                 Factors:Hypertension. History of smoking 30 or more pack years.  Sonographer:    Alvino Chapel RCS Referring Phys: [id number] [place]  1. Left ventricular ejection fraction, by estimation, is 20 to 25%. The left ventricle has severely decreased function. The left ventricle demonstrates global hypokinesis. The left ventricular internal cavity size was severely dilated. Left ventricular diastolic function could not be evaluated. Elevated left ventricular end-diastolic pressure.  2. Right ventricular systolic function is mildly reduced. The right ventricular size is mildly enlarged. There is moderately elevated pulmonary artery systolic pressure.  3.  Left atrial size was severely dilated.  4. Right atrial size was severely dilated.  5. The mitral valve is abnormal. Severe mitral valve regurgitation.  6. Tricuspid valve regurgitation is moderate.  7. The aortic valve is tricuspid. Aortic valve regurgitation is moderate to severe.  8. The inferior vena cava is normal in size with <50% respiratory variability, suggesting right atrial pressure of 8 mmHg. Comparison(s): Prior images unable to be directly viewed, comparison made by report only. Conclusion(s)/Recommendation(s): Compared  to report from 2015, EF similar (prior <20%). Noted prior moderate/severe MR, mild AR; both now appear more severe. TR is moderate on this study. FINDINGS  Left Ventricle: Heavily trabeculated at the apex, cannot exclude noncompaction. Left ventricular ejection fraction, by estimation, is 20 to 25%. The left ventricle has severely decreased function. The left ventricle demonstrates global hypokinesis. The left ventricular internal cavity size was severely dilated. There is borderline left ventricular hypertrophy. Left ventricular diastolic function could not be evaluated due to atrial fibrillation. Left ventricular diastolic function could not be evaluated. Elevated left ventricular end-diastolic pressure. Right Ventricle: The right ventricular size is mildly enlarged. Right vetricular wall thickness was not well visualized. Right ventricular systolic function is mildly reduced. There is moderately elevated pulmonary artery systolic pressure. The tricuspid  regurgitant velocity is 3.33 m/s, and with an assumed right atrial pressure of 8 mmHg, the estimated right ventricular systolic pressure is 03.0 mmHg. Left Atrium: Left atrial size was severely dilated. Right Atrium: Right atrial size was severely dilated. Pericardium: Trivial pericardial effusion is present. Mitral Valve: Vena contracta 0.8 cm, extends throughout severely dilated LA. PISA and PV not interrogated. The mitral valve is  abnormal. There is moderate thickening of the mitral valve leaflet(s). There is moderate calcification of the mitral valve leaflet(s). Severe mitral valve regurgitation. Tricuspid Valve: The tricuspid valve is normal in structure. Tricuspid valve regurgitation is moderate . No evidence of tricuspid stenosis. Aortic Valve: Moderate by pressure half time, but nearly fills LVOT and vena contracta 0.71 cm. This is more consistent with severe aortic regurgitation. The aortic valve is tricuspid. Aortic valve regurgitation is moderate to severe. Aortic regurgitation PHT measures 461 msec. Pulmonic Valve: The pulmonic valve was grossly normal. Pulmonic valve regurgitation is mild to moderate. No evidence of pulmonic stenosis. Aorta: The aortic root and ascending aorta are structurally normal, with no evidence of dilitation. Venous: The inferior vena cava is normal in size with less than 50% respiratory variability, suggesting right atrial pressure of 8 mmHg. IAS/Shunts: The atrial septum is grossly normal.  LEFT VENTRICLE PLAX 2D LVIDd:         6.44 cm      Diastology LVIDs:         5.60 cm      LV e' medial:    4.20 cm/s LV PW:         1.00 cm      LV E/e' medial:  21.8 LV IVS:        1.10 cm      LV e' lateral:   6.90 cm/s LVOT diam:     2.00 cm      LV E/e' lateral: 13.3 LV SV:         51 LV SV Index:   25 LVOT Area:     3.14 cm  LV Volumes (MOD) LV vol d, MOD A2C: 128.0 ml LV vol d, MOD A4C: 153.0 ml LV vol s, MOD A2C: 91.4 ml LV vol s, MOD A4C: 99.9 ml LV SV MOD A2C:     36.6 ml LV SV MOD A4C:     153.0 ml LV SV MOD BP:      41.3 ml RIGHT VENTRICLE RV S prime:     7.20 cm/s TAPSE (M-mode): 1.5 cm LEFT ATRIUM              Index       RIGHT ATRIUM           Index LA diam:  4.40 cm  2.20 cm/m  RA Area:     28.60 cm LA Vol (A2C):   174.0 ml 86.97 ml/m RA Volume:   89.90 ml  44.94 ml/m LA Vol (A4C):   166.0 ml 82.98 ml/m LA Biplane Vol: 174.0 ml 86.97 ml/m  AORTIC VALVE LVOT Vmax:   72.00 cm/s LVOT Vmean:   46.700 cm/s LVOT VTI:    0.161 m AI PHT:      461 msec  AORTA Ao Root diam: 3.50 cm MITRAL VALVE               TRICUSPID VALVE MV Area (PHT): 5.66 cm    TR Peak grad:   44.4 mmHg MV Decel Time: 134 msec    TR Vmax:        333.00 cm/s MR Peak grad: 55.7 mmHg MR Mean grad: 34.0 mmHg    SHUNTS MR Vmax:      373.00 cm/s  Systemic VTI:  0.16 m MR Vmean:     263.0 cm/s   Systemic Diam: 2.00 cm MV E velocity: 91.70 cm/s Buford Dresser MD Electronically signed by Buford Dresser MD Signature Date/Time: 10/19/2020/3:38:49 PM    Final      Scheduled Meds: .  stroke: mapping our early stages of recovery book   Does not apply Once  . aspirin  324 mg Oral Daily   Or  . aspirin  300 mg Rectal Daily  . atorvastatin  40 mg Oral Daily  . Chlorhexidine Gluconate Cloth  6 each Topical Daily  . enoxaparin (LOVENOX) injection  40 mg Subcutaneous QHS  . mouth rinse  15 mL Mouth Rinse BID   Continuous Infusions: . sodium chloride 75 mL/hr at 10/20/20 0600     LOS: 2 days   Time Spent in minutes   30 minutes  Ghina Bittinger D.O. on 10/20/2020 at 1:51 PM  Between 7am to 7pm - Please see pager noted on [email or web address]  After 7pm go to [email or web address]  And look for the night coverage person covering for me after hours  [facility] Office  [phone]

## 2020-10-20 NOTE — Progress Notes (Signed)
Physical Therapy Treatment Patient Details Name: Angelica Strickland MRN: [id number] DOB: [birth year] Today's Date: 10/20/2020    History of Present Illness 65 y/o female presented to ED on 5/27 after being found down with R facial droop, R arm and leg flaccid, and L gaze preference. CT head and MRI found L MCA infarct. PMH: CHF, Afib, HTN, COPD, depression    PT Comments    Patient progressing towards physical therapy goals. Patient following 50% of commands this session, however requires repetition and increased time. Patient required modA+2 for supine>sit and sit to stand transfer with HHAx2. Patient with initiation of movement to stand with anterior weight shift. Continues to demonstrate R inattention and L gaze preference. Continue to recommend comprehensive inpatient rehab (CIR) for post-acute therapy needs.    Follow Up Recommendations  CIR     Equipment Recommendations  Other (comment) (TBD)    Recommendations for Other Services       Precautions / Restrictions Precautions Precautions: Fall;Other (comment) Precaution Comments: aphasia Restrictions Weight Bearing Restrictions: No    Mobility  Bed Mobility Overal bed mobility: Needs Assistance Bed Mobility: Supine to Sit;Sit to Supine     Supine to sit: Mod assist;+2 for physical assistance Sit to supine: Mod assist   General bed mobility comments: modA+2 for supine>sit to bring R LE towards EOB. Patient able to initiate L LE movement off bed and repositioning of hips towards EOB. ModA to return to supine with assist for B LEs.    Transfers Overall transfer level: Needs assistance Equipment used: 2 person hand held assist Transfers: Sit to/from Stand Sit to Stand: Mod assist;+2 physical assistance;+2 safety/equipment         General transfer comment: modA+2 to power up into standing. Patient initiating anterior weight shift without cueing to stand. Cues for neck extension and bringing hips  foward  Ambulation/Gait             General Gait Details: deferred   Stairs             Wheelchair Mobility    Modified Rankin (Stroke Patients Only) Modified Rankin (Stroke Patients Only) Pre-Morbid Rankin Score: No symptoms Modified Rankin: Severe disability     Balance Overall balance assessment: Needs assistance Sitting-balance support: Single extremity supported;Feet supported Sitting balance-Leahy Scale: Poor Sitting balance - Comments: up to minA to maintain sitting EOB with R lateral lean. Able to correct with cueing and assist from bed rail Postural control: Right lateral lean Standing balance support: Bilateral upper extremity supported;During functional activity Standing balance-Leahy Scale: Poor Standing balance comment: maxA to maintain standing EOB with B UE support                            Cognition Arousal/Alertness: Awake/alert Behavior During Therapy: WFL for tasks assessed/performed Overall Cognitive Status: Difficult to assess Area of Impairment: Following commands                       Following Commands: Follows one step commands inconsistently;Follows one step commands with increased time       General Comments: Following ~50% commands during session but required increased time and repetition to complete. Able to verbalize "yes" "no" "i don't know" and "bye". Giggling at times      Exercises General Exercises - Lower Extremity Long Arc Quad: 5 reps;Left;AAROM;Seated    General Comments General comments (skin integrity, edema, etc.): VSS      Pertinent  Vitals/Pain Pain Assessment: Faces Faces Pain Scale: No hurt Pain Intervention(s): Monitored during session    Home Living                      Prior Function            PT Goals (current goals can now be found in the care plan section) Acute Rehab PT Goals Patient Stated Goal: unable to state PT Goal Formulation: Patient unable to  participate in goal setting Time For Goal Achievement: 11/01/20 Potential to Achieve Goals: Fair Progress towards PT goals: Progressing toward goals    Frequency    Min 4X/week      PT Plan Current plan remains appropriate    Co-evaluation PT/OT/SLP Co-Evaluation/Treatment: Yes Reason for Co-Treatment: For patient/therapist safety;To address functional/ADL transfers PT goals addressed during session: Mobility/safety with mobility;Balance        AM-PAC PT "6 Clicks" Mobility   Outcome Measure  Help needed turning from your back to your side while in a flat bed without using bedrails?: A Lot Help needed moving from lying on your back to sitting on the side of a flat bed without using bedrails?: A Lot Help needed moving to and from a bed to a chair (including a wheelchair)?: A Lot Help needed standing up from a chair using your arms (e.g., wheelchair or bedside chair)?: A Lot Help needed to walk in hospital room?: Total Help needed climbing 3-5 steps with a railing? : Total 6 Click Score: 10    End of Session Equipment Utilized During Treatment: Gait belt Activity Tolerance: Patient tolerated treatment well Patient left: in bed;with call bell/phone within reach;with bed alarm set Nurse Communication: Mobility status PT Visit Diagnosis: Unsteadiness on feet (R26.81);Muscle weakness (generalized) (M62.81);Other abnormalities of gait and mobility (R26.89);Difficulty in walking, not elsewhere classified (R26.2);Other symptoms and signs involving the nervous system (K59.935)     Time: 7017-7939 PT Time Calculation (min) (ACUTE ONLY): 23 min  Charges:  $Therapeutic Activity: 8-22 mins                     Jerell Demery A. Gilford Rile PT, DPT Acute Rehabilitation Services Pager [phone] Office [phone]    Linna Hoff 10/20/2020, 11:22 AM

## 2020-10-20 NOTE — Progress Notes (Signed)
Occupational Therapy Treatment Patient Details Name: Angelica Strickland MRN: [id number] DOB: Mar 04, 1956 Today's Date: 10/20/2020    History of present illness 65 y/o female presented to ED on 5/27 after being found down with R facial droop, R arm and leg flaccid, and L gaze preference. CT head and MRI found L MCA infarct. PMH: CHF, Afib, HTN, COPD, depression   OT comments  Patient supine in bed, engaged in OT/PT session. Patient requires repetition and increased time, following approximately 50% of commands. Requires demonstration initially to give 'thumbs up' but able to repeat after. She requires mod assist +2 to transition to EOB, sitting EOB  With up to min assist for balance with R lateral lean at times but with multimodal cueing pt able to correct to midline. Cueing to scan with poor sustained attention to R side, L gaze preference. R UE remains flaccid.  Verbalizes "I don't know" and "bye" during session appropriately. Will follow.     Follow Up Recommendations  CIR    Equipment Recommendations  Other (comment) (TBD at next venue of care)    Recommendations for Other Services      Precautions / Restrictions Precautions Precautions: Fall;Other (comment) Precaution Comments: aphasia Restrictions Weight Bearing Restrictions: No       Mobility Bed Mobility Overal bed mobility: Needs Assistance Bed Mobility: Supine to Sit;Sit to Supine     Supine to sit: Mod assist;+2 for physical assistance Sit to supine: Mod assist   General bed mobility comments: modA+2 for supine>sit to bring R LE towards EOB. Patient able to initiate L LE movement off bed and repositioning of hips towards EOB. ModA to return to supine with assist for B LEs.    Transfers Overall transfer level: Needs assistance Equipment used: 2 person hand held assist Transfers: Sit to/from Stand Sit to Stand: Mod assist;+2 physical assistance;+2 safety/equipment         General transfer comment: modA+2 to power  up into standing. Patient initiating anterior weight shift without cueing to stand. Cues for neck extension and bringing hips foward    Balance Overall balance assessment: Needs assistance Sitting-balance support: Single extremity supported;Feet supported Sitting balance-Leahy Scale: Poor Sitting balance - Comments: up to minA to maintain sitting EOB with R lateral lean. Able to correct with cueing and assist from bed rail Postural control: Right lateral lean Standing balance support: Bilateral upper extremity supported;During functional activity Standing balance-Leahy Scale: Poor Standing balance comment: maxA to maintain standing EOB with B UE support                           ADL either performed or assessed with clinical judgement   ADL Overall ADL's : Needs assistance/impaired     Grooming: Maximal assistance;Wash/dry face Grooming Details (indicate cue type and reason): wash face with L UE, max assist to initate and sustain attention to task                                     Vision   Additional Comments: max cueing to track and turn head towards R side, L gaze preference   Perception     Praxis      Cognition Arousal/Alertness: Awake/alert Behavior During Therapy: WFL for tasks assessed/performed Overall Cognitive Status: Difficult to assess Area of Impairment: Following commands  Following Commands: Follows one step commands inconsistently;Follows one step commands with increased time       General Comments: Following ~50% commands during session but required increased time and repetition to complete. Able to verbalize "yes" "no" "i don't know" and "bye". Giggling at times.        Exercises Exercises: General Lower Extremity General Exercises - Lower Extremity Long Arc Quad: 5 reps;Left;AAROM;Seated   Shoulder Instructions       General Comments VSS    Pertinent Vitals/ Pain       Pain  Assessment: Faces Faces Pain Scale: No hurt Pain Intervention(s): Monitored during session  Home Living                                          Prior Functioning/Environment              Frequency  Min 2X/week        Progress Toward Goals  OT Goals(current goals can now be found in the care plan section)  Progress towards OT goals: Progressing toward goals  Acute Rehab OT Goals Patient Stated Goal: unable to state  Plan Discharge plan remains appropriate;Frequency remains appropriate    Co-evaluation    PT/OT/SLP Co-Evaluation/Treatment: Yes Reason for Co-Treatment: For patient/therapist safety;To address functional/ADL transfers PT goals addressed during session: Mobility/safety with mobility;Balance OT goals addressed during session: ADL's and self-care      AM-PAC OT "6 Clicks" Daily Activity     Outcome Measure   Help from another person eating meals?: Total Help from another person taking care of personal grooming?: A Lot Help from another person toileting, which includes using toliet, bedpan, or urinal?: Total Help from another person bathing (including washing, rinsing, drying)?: Total Help from another person to put on and taking off regular upper body clothing?: Total Help from another person to put on and taking off regular lower body clothing?: Total 6 Click Score: 7    End of Session Equipment Utilized During Treatment: Oxygen  OT Visit Diagnosis: Other abnormalities of gait and mobility (R26.89);Muscle weakness (generalized) (M62.81);Cognitive communication deficit (R41.841);Other symptoms and signs involving cognitive function;Hemiplegia and hemiparesis Hemiplegia - Right/Left: Right   Activity Tolerance Patient tolerated treatment well   Patient Left in bed;with call bell/phone within reach;with bed alarm set   Nurse Communication Mobility status        Time: 8546-2703 OT Time Calculation (min): 23 min  Charges: OT  General Charges $OT Visit: 1 Visit OT Treatments $Self Care/Home Management : 8-22 mins  Jolaine Artist, OT Acute Rehabilitation Services Pager [phone] Office [place] 10/20/2020, 11:48 AM

## 2020-10-21 DIAGNOSIS — I639 Cerebral infarction, unspecified: Secondary | ICD-10-CM | POA: Diagnosis not present

## 2020-10-21 LAB — HEMOGLOBIN A1C
Hgb A1c MFr Bld: 5.3 % (ref 4.8–5.6)
Mean Plasma Glucose: 105 mg/dL

## 2020-10-21 LAB — CBC
HCT: 32.5 % — ABNORMAL LOW (ref 36.0–46.0)
Hemoglobin: 10.4 g/dL — ABNORMAL LOW (ref 12.0–15.0)
MCH: 32.3 pg (ref 26.0–34.0)
MCHC: 32 g/dL (ref 30.0–36.0)
MCV: 100.9 fL — ABNORMAL HIGH (ref 80.0–100.0)
Platelets: 101 10*3/uL — ABNORMAL LOW (ref 150–400)
RBC: 3.22 MIL/uL — ABNORMAL LOW (ref 3.87–5.11)
RDW: 13.3 % (ref 11.5–15.5)
WBC: 6.1 10*3/uL (ref 4.0–10.5)
nRBC: 0 % (ref 0.0–0.2)

## 2020-10-21 MED ORDER — LORAZEPAM 0.5 MG PO TABS
0.5000 mg | ORAL_TABLET | ORAL | Status: DC | PRN
  Administered 2020-10-21 – 2020-10-28 (×6): 0.5 mg via ORAL
  Filled 2020-10-21 (×6): qty 1

## 2020-10-21 NOTE — Plan of Care (Signed)
  Problem: Education: Goal: Knowledge of General Education information will improve Description: Including pain rating scale, medication(s)/side effects and non-pharmacologic comfort measures Outcome: Progressing   Problem: Health Behavior/Discharge Planning: Goal: Ability to manage health-related needs will improve Outcome: Progressing   Problem: Clinical Measurements: Goal: Ability to maintain clinical measurements within normal limits will improve Outcome: Progressing Goal: Will remain free from infection Outcome: Progressing Goal: Diagnostic test results will improve Outcome: Progressing Goal: Respiratory complications will improve Outcome: Progressing Goal: Cardiovascular complication will be avoided Outcome: Progressing   Problem: Activity: Goal: Risk for activity intolerance will decrease Outcome: Progressing   Problem: Nutrition: Goal: Adequate nutrition will be maintained Outcome: Progressing   Problem: Coping: Goal: Level of anxiety will decrease Outcome: Progressing   Problem: Elimination: Goal: Will not experience complications related to bowel motility Outcome: Progressing Goal: Will not experience complications related to urinary retention Outcome: Progressing   Problem: Pain Managment: Goal: General experience of comfort will improve Outcome: Progressing   Problem: Safety: Goal: Ability to remain free from injury will improve Outcome: Progressing   Problem: Skin Integrity: Goal: Risk for impaired skin integrity will decrease Outcome: Progressing   Problem: Education: Goal: Knowledge of disease or condition will improve Outcome: Progressing Goal: Knowledge of secondary prevention will improve Outcome: Progressing Goal: Knowledge of patient specific risk factors addressed and post discharge goals established will improve Outcome: Progressing Goal: Individualized Educational Video(s) Outcome: Progressing   Problem: Health Behavior/Discharge  Planning: Goal: Ability to manage health-related needs will improve Outcome: Progressing   Problem: Self-Care: Goal: Ability to participate in self-care as condition permits will improve Outcome: Progressing Goal: Ability to communicate needs accurately will improve Outcome: Progressing   Problem: Nutrition: Goal: Risk of aspiration will decrease Outcome: Progressing Goal: Dietary intake will improve Outcome: Progressing   Problem: Ischemic Stroke/TIA Tissue Perfusion: Goal: Complications of ischemic stroke/TIA will be minimized Outcome: Progressing

## 2020-10-21 NOTE — Progress Notes (Addendum)
Inpatient Rehabilitation Admissions Coordinator  I met at bedside with patient for rehab assessment. She is non verbal. I called the [facility], [name], listed and the person states she is not Education officer, environmental. I have contacted acute team and TOK to assist in clarifying [facility] contact.  Danne Baxter, RN, MSN Rehab Admissions Coordinator [phone] 10/21/2020 12:28 PM   I received updated phone number from [facility], Vida Roller, and called patient's sister, [name]. We discussed goals and expectations of a possible CIR admit. The patient lived alone for the fast 3 months for her partner died, Angelica Strickland. She was his caregiver. His children and sister will discuss caregiver supports available and then sister will contact me with their preference for CIR vs SNF. I will begin Auth with [facility] for a possible CIR admit.  Danne Baxter, RN, MSN Rehab Admissions Coordinator [phone] 10/21/2020 12:51 PM   I received call from [facility] and we have arranged to meet tomorrow at 1015 to discuss her sister's rehab needs.  Danne Baxter, RN, MSN Rehab Admissions Coordinator [phone] 10/21/2020 1:29 PM

## 2020-10-21 NOTE — Care Management Important Message (Signed)
Important Message  Patient Details  Name: Angelica Strickland MRN: [id number] Date of Birth: Jul 08, 1955   Medicare Important Message Given:  Yes     Keelyn Fjelstad [address] 10/21/2020, 2:58 PM

## 2020-10-21 NOTE — Progress Notes (Signed)
Went into room for bedside report and found patient had pulled out her foley catheter. Balloon was fully intact. Upon assessment no trauma, bleeding, or pain was found. MD has been paged. Mansy advised to use purwick and bladder scanning.  Call back for no void. zyprexa ordered.

## 2020-10-21 NOTE — Progress Notes (Signed)
PROGRESS NOTE    Angelica Strickland  QTM:[id number] DOB: 1955-10-15 DOA: 10/17/2020 PCP: Birdie Sons, MD   Brief Narrative:  HPI On 10/18/2020 by Dr. Babs Bertin Angelica Strickland is a 65 y.o. female with medical history significant for chronic systolic heart failure with most recent echocardiogram in May 2015 showing LVEF less than 20%, paroxysmal atrial fibrillation not on chronic anticoagulation in the setting of thrombocytopenia, hypertension, COPD, who is admitted to [facility] on 10/17/2020 with acute ischemic stroke after presenting from home to [facility] ED via EMS for evaluation of right facial droop and right-sided weakness.  In the context of the patient's current expressive aphasia, the following history of the patient's history, addition to my discussions with the emergency department physician and via chart review.  Per the patient's sister, she conveys that she most recently spoke to the patient over the phone at approximately 1400 on 10/17/2020, at which time the patient was in her normal state of health, and without acute complaint.  A few hours later, patient's sister attempted to contact the patient again, who was unable to reach her, prompting family members to go to the patient's, and upon no response to their knocking on the door, were able to gain entrance into the house at which time they found the patient laying on the floor, with evidence of presenting weakness as well as expressive aphasia.  At that time, they contacted EMS who brought patient [facility] to [facility] emergency department for further evaluation of the above, with patient noted to arrive at [facility] ED on 2320 on 10/17/2020.  In terms of modifiable CV risk factors, per chart review, it appears that the patient has a history of hypertension, for which she is prescribed Coreg as well as lisinopril.  She also has a history of paroxysmal atrial fibrillation for which she is not chronically anticoagulated in the setting of  a documented history of thrombocytopenia.  Rather, she has been taking a daily full dose aspirin at home, but no additional blood thinning agents.  No known history of underlying hyperlipidemia or diabetes.  Pressure review, she is a former smoker, having completely quit smoking approximately 10 years ago after smoking less than 1 pack/day for 4 to 5 years.  No known history of underlying obstructive sleep apnea.  Not on any lipid-lowering medications as an outpatient, including no statin.   Per chart review, most recent echocardiogram was performed in May 2015 and showed LVEF less than 20%, moderately dilated left ventricular cavity size, mildly dilated left atrium, moderately dilated right atrium, moderate to severe mitral regurgitation, moderate to severe tricuspid regurgitation.  Patient's home diuretic regimen consists of torsemide 20 mg p.o. daily.   Interim history Admitted with CVA. Pending CIR  Assessment & Plan   Acute ischemic CVA -Patient presented with right facial droop and right-sided weakness as well as expressive aphasia -CT head showed evolving left MCA infarct with hyperdense proximal left MCA -CTA head and neck showed left M1 occlusion, no flow-limiting stenosis or embolic source seen in the atheromatous cervical carotids.  High-grade atheromatous narrowing at the right vertebral origin. -MRI brain: Moderately large acute to early subacute left MCA distribution infarct, associated mild petechial hemorrhage without frank hemorrhagic transformation.  No significant mass-effect. -MRA head showed occlusion of the proximal mid left M1 segment mild atherosclerotic change elsewhere within the intracranial circulation with no hemodynamically significant or correctable stenosis. -Echocardiogram EF of 20 to 25%, severely decreased function, LV demonstrates global hypokinesis, moderately  elevated pulmonary artery pressure.  Severe mitral valve regurgitation.  Moderate to severe aortic valve  regurgitation. -LDL 110, hemoglobin A1c still 5.3 -Continue aspirin and statin -PT and OT recommending CIR -Speech therapy consulted and recommended dysphagia 1 diet, nectar thick liquids -Neurology consulted and appreciated-recommending to start Eliquis on October 22, 2020, 5 days post stroke if patient remains stable  Cervical ligamentous injury -EMS initially placed a cervical collar on patient  -On 5/29, Discussed with Dr. Erlinda Hong, recommended obtaining MRI of cervical spine as patient currently has cervical collar in place to assess for any type of injury or trauma -MRI cervical spine: Small volume prevertebral fluid extending from the craniocervical junction to C6-C7 with mild uplifting of the anterior longitudinal ligament, suggesting mild ligamentous injury.  Multiple level degenerative change, moderate to severe foraminal stenosis left C7-T1, mild canal stenosis C5-C6, general degenerative disc height loss C6-7, multilevel mild foraminal stenosis -Discussed findings with neurosurgery, Dr. Venetia Constable, felt it would be okay to remove cervical collar  Essential hypertension -Given acute CVA, allow for permissive hypertension-treat systolic blood pressures of greater than 027 or diastolic pressures are greater than 110  COPD -Currently stable, continue albuterol as needed  Chronic systolic heart failure -Last echocardiogram in May 2015 shows an EF of less than 20% -Currently appears to be euvolemic and compensated -Lisinopril and Coreg, torsemide held for now to allow for permissive hypertension -Repeat echocardiogram as above  Paroxysmal atrial fibrillation -Currently irregular irregular -CHA2DS2-VASc of 6 -Patient was not on any anticoagulation given history of thrombocytopenia -If platelets remain stable, can start Eliquis on 10/22/2020  Thrombocytopenia -Chronic, platelets currently 101 -continue to monitor CBC  Chronic kidney disease, stage IIIa -Creatinine stable, continue to  monitor BMP  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   Difficult to place patient No   Consultants Neurology  Inpatient rehab  Procedures  Echocardiogram  Antibiotics   Anti-infectives (From admission, onward)   None      Subjective:   Angelica Strickland seen and examined today.  Minimal interaction, and aphasic. Feeding herself breakfast.  Objective:   Vitals:   10/20/20 1941 10/20/20 2329 10/21/20 0353 10/21/20 0805  BP: (!) 87/60 (!) 88/54 93/60 (!) 100/49  Pulse: 69 86 68 83  Resp: 16 16 16 18   Temp: 98.7 F (37.1 C) 98.3 F (36.8 C) 98.5 F (36.9 C) 97.7 F (36.5 C)  TempSrc: Oral Oral Oral Oral  SpO2: 98% 97% 98% 94%  Weight:   83.6 kg     Intake/Output Summary (Last 24 hours) at 10/21/2020 1218 Last data filed at 10/21/2020 0600 Gross per 24 hour  Intake 669.51 ml  Output 650 ml  Net 19.51 ml   Filed Weights   10/20/20 0341 10/21/20 0353  Weight: 83.3 kg 83.6 kg    Exam  General: Well developed, ill appearing, NAD  HEENT: NCAT, mucous membranes moist.   Cardiovascular:  S1 S2 auscultated, irregular, 2/6 SEM.  Respiratory: Clear to auscultation bilaterally, no wheezing  Abdomen: Soft, nontender, nondistended, + bowel sounds, obese  Extremities: warm dry without cyanosis clubbing or edema  Neuro: Awake and alert, however cannot assess orientation at this time.  Feeding herself with her LUE. R facial droop. Answers some simple questions   Data  Reviewed: I have personally reviewed following labs and imaging studies  CBC: Recent Labs  Lab 10/17/20 2322 10/18/20 0554 10/19/20 0358 10/20/20 0138 10/21/20 0300  WBC 7.3 7.1 6.1 7.0 6.1  NEUTROABS 4.9  --   --   --   --   HGB 13.4 12.8 11.3* 11.1* 10.4*  HCT 40.5 38.7 34.7* 33.8* 32.5*  MCV 99.0 98.7 100.9* 101.8* 100.9*  PLT 144* 130* 105* 98* 790*   Basic Metabolic Panel: Recent Labs  Lab 10/17/20 2322 10/18/20 0554 10/19/20 0358  NA 135 138 139  K 4.4 4.3 4.0  CL 104 108 113*  CO2 23 23 18*  GLUCOSE 103* 89 92  BUN 28* 25* 20  CREATININE 1.21* 1.10* 0.96  CALCIUM 9.2 8.9 8.5*  MG  --  2.0  --    GFR: CrCl cannot be calculated (Unknown ideal weight.). Liver Function Tests: Recent Labs  Lab 10/17/20 2322  AST 17  ALT 10  ALKPHOS 51  BILITOT 1.3*  PROT 6.7  ALBUMIN 3.8   No results for input(s): LIPASE, AMYLASE in the last 168 hours. No results for input(s): AMMONIA in the last 168 hours. Coagulation Profile: Recent Labs  Lab 10/17/20 2322  INR 1.2   Cardiac Enzymes: No results for input(s): CKTOTAL, CKMB, CKMBINDEX, TROPONINI in the last 168 hours. BNP (last 3 results) No results for input(s): PROBNP in the last 8760 hours. HbA1C: No results for input(s): HGBA1C in the last 72 hours. CBG: No results for input(s): GLUCAP in the last 168 hours. Lipid Profile: No results for input(s): CHOL, HDL, LDLCALC, TRIG, CHOLHDL, LDLDIRECT in the last 72 hours. Thyroid Function Tests: No results for input(s): TSH, T4TOTAL, FREET4, T3FREE, THYROIDAB in the last 72 hours. Anemia Panel: No results for input(s): VITAMINB12, FOLATE, FERRITIN, TIBC, IRON, RETICCTPCT in the last 72 hours. Urine analysis:    Component Value Date/Time   COLORURINE YELLOW 10/17/2020 2324   APPEARANCEUR CLEAR 10/17/2020 2324   APPEARANCEUR Clear 10/11/2013 1747   LABSPEC 1.015 10/17/2020 2324   LABSPEC 1.008 10/11/2013 1747   PHURINE 5.0 10/17/2020 2324   GLUCOSEU NEGATIVE  10/17/2020 2324   GLUCOSEU Negative 10/11/2013 1747   HGBUR NEGATIVE 10/17/2020 2324   BILIRUBINUR NEGATIVE 10/17/2020 2324   BILIRUBINUR Negative 10/11/2013 1747   KETONESUR NEGATIVE 10/17/2020 2324   PROTEINUR NEGATIVE 10/17/2020 2324   NITRITE NEGATIVE 10/17/2020 2324   LEUKOCYTESUR NEGATIVE 10/17/2020 2324   LEUKOCYTESUR 2+ 10/11/2013 1747   Sepsis Labs: @LABRCNTIP (procalcitonin:4,lacticidven:4)  ) Recent Results (from the past 240 hour(s))  Resp Panel by RT-PCR (Flu A&B, Covid) Urine, Catheterized     Status: None   Collection Time: 10/17/20 11:24 PM   Specimen: Urine, Catheterized; Nasopharyngeal(NP) swabs in vial transport medium  Result Value Ref Range Status   SARS Coronavirus 2 by RT PCR NEGATIVE NEGATIVE Final    Comment: (NOTE) SARS-CoV-2 target nucleic acids are NOT DETECTED.  The SARS-CoV-2 RNA is generally detectable in upper respiratory specimens during the acute phase of infection. The lowest concentration of SARS-CoV-2 viral copies this assay can detect is 138 copies/mL. A negative result does not preclude SARS-Cov-2 infection and should not  be used as the sole basis for treatment or other patient management decisions. A negative result may occur with  improper specimen collection/handling, submission of specimen other than nasopharyngeal swab, presence of viral mutation(s) within the areas targeted by this assay, and inadequate number of viral copies(<138 copies/mL). A negative result must be combined with clinical observations, patient history, and epidemiological information. The expected result is Negative.  Fact Sheet for Patients:  EntrepreneurPulse.com.au  Fact Sheet for Healthcare Providers:  IncredibleEmployment.be  This test is no t yet approved or cleared by the [place] FDA and  has been authorized for detection and/or diagnosis of SARS-CoV-2 by FDA under an Emergency Use Authorization (EUA). This EUA  will remain  in effect (meaning this test can be used) for the duration of the COVID-19 declaration under Section 564(b)(1) of the Act, 21 U.S.C.section 360bbb-3(b)(1), unless the authorization is terminated  or revoked sooner.       Influenza A by PCR NEGATIVE NEGATIVE Final   Influenza B by PCR NEGATIVE NEGATIVE Final    Comment: (NOTE) The Xpert Xpress SARS-CoV-2/FLU/RSV plus assay is intended as an aid in the diagnosis of influenza from Nasopharyngeal swab specimens and should not be used as a sole basis for treatment. Nasal washings and aspirates are unacceptable for Xpert Xpress SARS-CoV-2/FLU/RSV testing.  Fact Sheet for Patients: EntrepreneurPulse.com.au  Fact Sheet for Healthcare Providers: IncredibleEmployment.be  This test is not yet approved or cleared by the [place] FDA and has been authorized for detection and/or diagnosis of SARS-CoV-2 by FDA under an Emergency Use Authorization (EUA). This EUA will remain in effect (meaning this test can be used) for the duration of the COVID-19 declaration under Section 564(b)(1) of the Act, 21 U.S.C. section 360bbb-3(b)(1), unless the authorization is terminated or revoked.  Performed at [facility], [place] [address], [place], [place] [address]   Urine culture     Status: None   Collection Time: 10/17/20 11:24 PM   Specimen: Urine, Catheterized  Result Value Ref Range Status   Specimen Description URINE, CATHETERIZED  Final   Special Requests NONE  Final   Culture   Final    NO GROWTH Performed at [facility], [address] [place], [place] [address]    Report Status 10/19/2020 FINAL  Final      Radiology Studies: ECHOCARDIOGRAM COMPLETE  Result Date: 10/19/2020    ECHOCARDIOGRAM REPORT   Patient Name:   Angelica Strickland Date of Exam: 10/19/2020 Medical Rec #:  [id number]       Height:       67.0 in Accession #:    [id number]      Weight:       195.0 lb Date of  Birth:  12-Jan-1956        BSA:          2.001 m Patient Age:    50 years        BP:           92/53 mmHg Patient Gender: F               HR:           72 bpm. Exam Location:  Inpatient Procedure: 2D Echo, Cardiac Doppler and Color Doppler Indications:    Stroke I63.9  History:        Patient has prior history of Echocardiogram examinations, most                 recent  09/26/2013. CHF, COPD, Arrythmias:Atrial Fibrillation; Risk                 Factors:Hypertension. History of smoking 30 or more pack years.  Sonographer:    Alvino Chapel RCS Referring Phys: [id number] [place]  1. Left ventricular ejection fraction, by estimation, is 20 to 25%. The left ventricle has severely decreased function. The left ventricle demonstrates global hypokinesis. The left ventricular internal cavity size was severely dilated. Left ventricular diastolic function could not be evaluated. Elevated left ventricular end-diastolic pressure.  2. Right ventricular systolic function is mildly reduced. The right ventricular size is mildly enlarged. There is moderately elevated pulmonary artery systolic pressure.  3. Left atrial size was severely dilated.  4. Right atrial size was severely dilated.  5. The mitral valve is abnormal. Severe mitral valve regurgitation.  6. Tricuspid valve regurgitation is moderate.  7. The aortic valve is tricuspid. Aortic valve regurgitation is moderate to severe.  8. The inferior vena cava is normal in size with <50% respiratory variability, suggesting right atrial pressure of 8 mmHg. Comparison(s): Prior images unable to be directly viewed, comparison made by report only. Conclusion(s)/Recommendation(s): Compared to report from 2015, EF similar (prior <20%). Noted prior moderate/severe MR, mild AR; both now appear more severe. TR is moderate on this study. FINDINGS  Left Ventricle: Heavily trabeculated at the apex, cannot exclude noncompaction. Left ventricular ejection fraction, by estimation, is  20 to 25%. The left ventricle has severely decreased function. The left ventricle demonstrates global hypokinesis. The left ventricular internal cavity size was severely dilated. There is borderline left ventricular hypertrophy. Left ventricular diastolic function could not be evaluated due to atrial fibrillation. Left ventricular diastolic function could not be evaluated. Elevated left ventricular end-diastolic pressure. Right Ventricle: The right ventricular size is mildly enlarged. Right vetricular wall thickness was not well visualized. Right ventricular systolic function is mildly reduced. There is moderately elevated pulmonary artery systolic pressure. The tricuspid  regurgitant velocity is 3.33 m/s, and with an assumed right atrial pressure of 8 mmHg, the estimated right ventricular systolic pressure is 93.2 mmHg. Left Atrium: Left atrial size was severely dilated. Right Atrium: Right atrial size was severely dilated. Pericardium: Trivial pericardial effusion is present. Mitral Valve: Vena contracta 0.8 cm, extends throughout severely dilated LA. PISA and PV not interrogated. The mitral valve is abnormal. There is moderate thickening of the mitral valve leaflet(s). There is moderate calcification of the mitral valve leaflet(s). Severe mitral valve regurgitation. Tricuspid Valve: The tricuspid valve is normal in structure. Tricuspid valve regurgitation is moderate . No evidence of tricuspid stenosis. Aortic Valve: Moderate by pressure half time, but nearly fills LVOT and vena contracta 0.71 cm. This is more consistent with severe aortic regurgitation. The aortic valve is tricuspid. Aortic valve regurgitation is moderate to severe. Aortic regurgitation PHT measures 461 msec. Pulmonic Valve: The pulmonic valve was grossly normal. Pulmonic valve regurgitation is mild to moderate. No evidence of pulmonic stenosis. Aorta: The aortic root and ascending aorta are structurally normal, with no evidence of dilitation.  Venous: The inferior vena cava is normal in size with less than 50% respiratory variability, suggesting right atrial pressure of 8 mmHg. IAS/Shunts: The atrial septum is grossly normal.  LEFT VENTRICLE PLAX 2D LVIDd:         6.44 cm      Diastology LVIDs:         5.60 cm      LV e' medial:    4.20 cm/s LV PW:  1.00 cm      LV E/e' medial:  21.8 LV IVS:        1.10 cm      LV e' lateral:   6.90 cm/s LVOT diam:     2.00 cm      LV E/e' lateral: 13.3 LV SV:         51 LV SV Index:   25 LVOT Area:     3.14 cm  LV Volumes (MOD) LV vol d, MOD A2C: 128.0 ml LV vol d, MOD A4C: 153.0 ml LV vol s, MOD A2C: 91.4 ml LV vol s, MOD A4C: 99.9 ml LV SV MOD A2C:     36.6 ml LV SV MOD A4C:     153.0 ml LV SV MOD BP:      41.3 ml RIGHT VENTRICLE RV S prime:     7.20 cm/s TAPSE (M-mode): 1.5 cm LEFT ATRIUM              Index       RIGHT ATRIUM           Index LA diam:        4.40 cm  2.20 cm/m  RA Area:     28.60 cm LA Vol (A2C):   174.0 ml 86.97 ml/m RA Volume:   89.90 ml  44.94 ml/m LA Vol (A4C):   166.0 ml 82.98 ml/m LA Biplane Vol: 174.0 ml 86.97 ml/m  AORTIC VALVE LVOT Vmax:   72.00 cm/s LVOT Vmean:  46.700 cm/s LVOT VTI:    0.161 m AI PHT:      461 msec  AORTA Ao Root diam: 3.50 cm MITRAL VALVE               TRICUSPID VALVE MV Area (PHT): 5.66 cm    TR Peak grad:   44.4 mmHg MV Decel Time: 134 msec    TR Vmax:        333.00 cm/s MR Peak grad: 55.7 mmHg MR Mean grad: 34.0 mmHg    SHUNTS MR Vmax:      373.00 cm/s  Systemic VTI:  0.16 m MR Vmean:     263.0 cm/s   Systemic Diam: 2.00 cm MV E velocity: 91.70 cm/s Buford Dresser MD Electronically signed by Buford Dresser MD Signature Date/Time: 10/19/2020/3:38:49 PM    Final      Scheduled Meds: .  stroke: mapping our early stages of recovery book   Does not apply Once  . aspirin  324 mg Oral Daily   Or  . aspirin  300 mg Rectal Daily  . atorvastatin  40 mg Oral Daily  . Chlorhexidine Gluconate Cloth  6 each Topical Daily  . enoxaparin (LOVENOX)  injection  40 mg Subcutaneous QHS  . mouth rinse  15 mL Mouth Rinse BID   Continuous Infusions: . sodium chloride 75 mL/hr at 10/20/20 0600     LOS: 3 days   Time Spent in minutes   30 minutes  Charene Mccallister D.O. on 10/21/2020 at 12:18 PM  Between 7am to 7pm - Please see pager noted on [email or web address]  After 7pm go to [email or web address]  And look for the night coverage person covering for me after hours  [facility] Office  [phone]

## 2020-10-21 NOTE — Progress Notes (Signed)
  Speech Language Pathology Treatment: Dysphagia;Cognitive-Linquistic  Patient Details Name: Angelica Strickland MRN: [id number] DOB: 1955-12-14 Today's Date: 10/21/2020 Time: 7062-3762 SLP Time Calculation (min) (ACUTE ONLY): 23 min  Assessment / Plan / Recommendation Clinical Impression  Pt alert and repositioned upright in bed for POs this afternoon. NTL via cup/straw and thin liquids via tsp and cup sips resulted in no overt s/sx of aspiration across x3-4 trials per consistency. Mildly prolonged oral manipulation/bolus formulation noted with spoonfuls of mechanical soft solids, less so with pureed textures. Given no indication of penetration or aspiration on MBS (10/18/20) and no clinical indications this date, recommend dysphagia 1/ thin liquid diet (no straws). Above communicated with RN.   Throughout PO trials, pt presented with solid/liquid POs in field of two and prompted to make a choice as to what she would like to consume next. With moderate multimodal cues, pt utilized eye gaze, pointing and verbalization x1 to indicate choice.  Basic biographical y/n questions answered with 50% accuracy with basic 1 step commands followed with 30-40% accuracy. During receptive naming task pt gestured to named object in field of two in 1/2 trials and verbally named 2 objects given sentence completion and phonemic cueing. SLP to continue f/u.   HPI HPI: Pt is a 65 yo adm to [facility] after recent fall, new onset of right side weakness, facial asymmetery and aphasia.  Imaging of brain showed moderately large acute to subacute CVA due to Left M1 occlusion.  Pt also with remote bilateral cerebellar cvas.  Cervical spine imaging showed osteophytic changes C5-C6, C6-C7.  PMH + for pituitary tumor, COPD, CHF, smoking.  .  Pt did not pass [facility].      SLP Plan  Continue with current plan of care       Recommendations  Diet recommendations: Dysphagia 1 (puree);Thin liquid Liquids provided via: Cup;No straw Medication  Administration: Crushed with puree Supervision: Full supervision/cueing for compensatory strategies;Staff to assist with self feeding Compensations: Minimize environmental distractions;Slow rate;Small sips/bites;Follow solids with liquid Postural Changes and/or Swallow Maneuvers: Seated upright 90 degrees                Oral Care Recommendations: Oral care BID Follow up Recommendations: Skilled Nursing facility SLP Visit Diagnosis: Dysphagia, oropharyngeal phase (R13.12);Aphasia (R47.01) Plan: Continue with current plan of care       [place], [place], [place] Office Number: [phone]  Acie Fredrickson 10/21/2020, 2:30 PM

## 2020-10-21 NOTE — Progress Notes (Signed)
Physical Therapy Treatment Patient Details Name: Angelica Strickland MRN: [id number] DOB: 21-May-1956 Today's Date: 10/21/2020    History of Present Illness 65 y/o female presented to ED on 5/27 after being found down with R facial droop, R arm and leg flaccid, and L gaze preference. CT head and MRI found L MCA infarct. PMH: CHF, Afib, HTN, COPD, depression    PT Comments    Pt making gradual progress. Requiring mod A x 2 for transfers with multiple sit to stands but unable to take any steps due to R sided weakness.  Pt following limited commands but does participate with functional task with cues. Continue to progress as able.    Follow Up Recommendations  CIR     Equipment Recommendations  Other (comment) (defer to post acute)    Recommendations for Other Services       Precautions / Restrictions Precautions Precautions: Fall;Other (comment) Precaution Comments: aphasia    Mobility  Bed Mobility Overal bed mobility: Needs Assistance Bed Mobility: Supine to Sit;Sit to Supine Rolling: Mod assist;+2 for physical assistance   Supine to sit: Mod assist;+2 for physical assistance Sit to supine: +2 for physical assistance;Mod assist   General bed mobility comments: Pt initiating movements when cued.  She is able to manage L side , required assist with R side.  PT positioned legs and arms in postions to facilitate transfers.  Mod A to control trunk and scoot forward with bed pad    Transfers Overall transfer level: Needs assistance Equipment used: Ambulation equipment used Transfers: Sit to/from Stand Sit to Stand: Mod assist;+2 physical assistance         General transfer comment: Performed sit to stand from bed x 1 and from STEDY x 4 during session.  Mod A to support trunk for balance, pt initiating anterior weight shift and pushing through L LE.  Assist to manage R UE.  Tactile cues/facilitation at buttock and shoulder to stand straight.  Ambulation/Gait              General Gait Details: deferred   Stairs             Wheelchair Mobility    Modified Rankin (Stroke Patients Only)       Balance Overall balance assessment: Needs assistance Sitting-balance support: Single extremity supported;Feet supported Sitting balance-Leahy Scale: Poor Sitting balance - Comments: With L UE support able to maintain EOB with close supervision.  However, without support leans R.  Sat EOB at least 10 mins during session.  Worked on weight shifting onto R UE with therapist supporting arm and then returning to midline.   Standing balance support: Bilateral upper extremity supported;During functional activity Standing balance-Leahy Scale: Poor Standing balance comment: Requiring L UE support on STEDY and min-mod A of 2 to maintain standing.  Tending to lean R.  Performed 5 stands for 10-20 sec each.  Able to weight shift during 1 set with R knee supported and tactile cues for quad activation (unable to do).  As fatigued required increased assist                            Cognition Arousal/Alertness: Awake/alert Behavior During Therapy: Flat affect Overall Cognitive Status: Difficult to assess Area of Impairment: Following commands                       Following Commands: Follows one step commands inconsistently  General Comments: Pt lethargic and with severe R inattention ; following limited commands.  No verbalizations today and no response by gestures (encouraged nods or thumbs up)      Exercises      General Comments        Pertinent Vitals/Pain Pain Assessment: No/denies pain Faces Pain Scale: No hurt    Home Living                      Prior Function            PT Goals (current goals can now be found in the care plan section) Acute Rehab PT Goals Patient Stated Goal: unable to state PT Goal Formulation: Patient unable to participate in goal setting Time For Goal Achievement: 11/01/20 Potential  to Achieve Goals: Fair Progress towards PT goals: Progressing toward goals    Frequency    Min 4X/week      PT Plan Current plan remains appropriate    Co-evaluation              AM-PAC PT "6 Clicks" Mobility   Outcome Measure  Help needed turning from your back to your side while in a flat bed without using bedrails?: A Lot Help needed moving from lying on your back to sitting on the side of a flat bed without using bedrails?: Total Help needed moving to and from a bed to a chair (including a wheelchair)?: Total Help needed standing up from a chair using your arms (e.g., wheelchair or bedside chair)?: Total Help needed to walk in hospital room?: Total Help needed climbing 3-5 steps with a railing? : Total 6 Click Score: 7    End of Session Equipment Utilized During Treatment: Gait belt Activity Tolerance: Patient tolerated treatment well Patient left: in bed;with call bell/phone within reach;with bed alarm set Nurse Communication: Mobility status PT Visit Diagnosis: Unsteadiness on feet (R26.81);Muscle weakness (generalized) (M62.81);Other abnormalities of gait and mobility (R26.89);Difficulty in walking, not elsewhere classified (R26.2);Other symptoms and signs involving the nervous system (R29.898)     Time: 8453-6468 PT Time Calculation (min) (ACUTE ONLY): 24 min  Charges:  $Therapeutic Activity: 8-22 mins $Neuromuscular Re-education: 8-22 mins                     Abran Richard, PT Acute Rehab Services Pager [phone] [facility] [place] 10/21/2020, 4:23 PM

## 2020-10-21 NOTE — Progress Notes (Signed)
Inpatient Rehab Admissions Coordinator:   At this time we are recommending a CIR consult and I will place an order per our protocol.   Shann Medal, PT, DPT Admissions Coordinator [phone] 10/21/20  8:54 AM

## 2020-10-21 NOTE — Consult Note (Signed)
Physical Medicine and Rehabilitation Consult Reason for Consult:stroke Referring Physician: Dr Ree Kida   HPI: Angelica Strickland is a 65 y.o. female with history of HTN, pituitary tumor, thrombocytopenia, COPD, PAF who was admitted on 10/17/2020 after found down with right facial droop, right-sided weakness and inability to talk.. CT head showed evolving left MCA infarct with hyperdense proximal left MCA. 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  MRI brain showed moderately large acute to early subacute left MCA infarct with mild petechial hemorrhage and occlusion of proximal mid left M1 segment and no significant flow within the left MCA.  Dr. Erlinda Hong felt to be likely due to A. fib and history of cardiomyopathy.  Recommendations are to consider Eliquis 5 days for stroke if patient remains stable.  MRI C-spine done to rule out injury and showed some prevertebral edema versus clear hematoma and mild degenerative changes without significant changes in spinal cord.  She was cleared to remove c-collar and no follow-up needed unless patient symptomatic per Dr. Venetia Constable.MBS done and patient started on D1 nectar liquids due to moderate oral and mild pharyngeal dysphagia.  She continues to be limited by right-sided weakness with right lean, aphasia, needs verbal and tactile cues to follow commands and left gaze preference affecting ADLs and mobility.  CIR was recommended due to functional decline.   Review of Systems  Unable to perform ROS: Patient nonverbal       Past Medical History:  Diagnosis Date  . Atrial fibrillation ([place]) 11/11/2014  . Carpal tunnel syndrome 11/11/2014  . Congestive heart failure ([place]) 11/11/2014  . COPD (chronic obstructive pulmonary disease) ([place]) 11/11/2014  . Depression 11/11/2014  . Essential hypertension  11/11/2014  . History of pituitary tumor 11/11/2014  . Hypopotassemia 11/11/2014  . Osteoarthritis 11/11/2014  . Thrombocytopenia ([place]) 11/11/2014    Past Surgical History:  Procedure Laterality Date  . CARDIAC CATHETERIZATION    . PITUITARY SURGERY    . UTERINE FIBROID SURGERY      Family History  Problem Relation Age of Onset  . Hypertension Mother   . Stroke Mother   . Alzheimer's disease Mother   . CAD Father   . Hypertension Father   . Healthy Sister   . Breast cancer Sister   . Healthy Sister     Social History: Lives alone. Was independent prior to admission.  Per eports that she quit smoking about 10 years ago. She has never used smokeless tobacco. Per eports that she does not drink alcohol and does not use drugs.   Allergies  Allergen Reactions  . 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 Clonidine Derivatives Rash    Medications Prior to Admission  Medication Sig Dispense Refill  . albuterol (PROVENTIL HFA;VENTOLIN HFA) 108 (90 Base) MCG/ACT inhaler Inhale 1 puff into the lungs every 6 (six) hours as needed for wheezing or shortness of breath. 1 Inhaler 2  . carvedilol (COREG) 12.5 MG tablet Take 12.5 mg by mouth 2 (two) times daily with a meal.    . lisinopril (PRINIVIL,ZESTRIL) 2.5 MG tablet Take 2.5 mg by mouth daily.    Marland Kitchen spironolactone (ALDACTONE) 25 MG tablet TAKE ONE TABLET BY MOUTH ONCE DAILY (Patient taking differently: Take 25 mg by mouth daily.) 90 tablet 1  . torsemide (DEMADEX) 20 MG tablet  Take 1 tablet (20 mg total) by mouth daily. (Patient taking differently: Take 40 mg by mouth daily.) 90 tablet 4  . KLOR-CON M20 20 MEQ tablet TAKE ONE TABLET BY MOUTH ONCE DAILY (Patient not taking: No sig reported) 90 tablet 3    Home: Home Living Family/patient expects to be discharged to:: Private residence Living Arrangements: Alone Home Equipment: None Additional Comments: pt unable to state PLOF, home set up. Some  info obtained from chart  Functional History: Prior Function Comments: Unsure due to global aphasia Functional Status:  Mobility: Bed Mobility Overal bed mobility: Needs Assistance Bed Mobility: Supine to Sit,Sit to Supine Rolling: Mod assist,+2 for physical assistance Supine to sit: Mod assist,+2 for physical assistance Sit to supine: +2 for physical assistance,Mod assist General bed mobility comments: Pt initiating movements when cued.  She is able to manage L side , required assist with R side.  PT positioned legs and arms in postions to facilitate transfers.  Mod A to control trunk and scoot forward with bed pad Transfers Overall transfer level: Needs assistance Equipment used: Ambulation equipment used Transfer via Lift Equipment: Stedy Transfers: Sit to/from Stand Sit to Stand: Mod assist,+2 physical assistance General transfer comment: Performed sit to stand from bed x 1 and from STEDY x 4 during session.  Mod A to support trunk for balance, pt initiating anterior weight shift and pushing through L LE.  Assist to manage R UE.  Tactile cues/facilitation at buttock and shoulder to stand straight. Ambulation/Gait General Gait Details: deferred    ADL: ADL Overall ADL's : Needs assistance/impaired Grooming: Maximal assistance,Wash/dry face Grooming Details (indicate cue type and reason): wash face with L UE, max assist to initate and sustain attention to task General ADL Comments: Pt is total A with all ADLs/selfcare. pt unable to follow commands to wash face using L UE despite Hand over hand assist  Cognition: Cognition Overall Cognitive Status: Difficult to assess Arousal/Alertness: Awake/alert Orientation Level: Oriented to person Attention: Sustained Sustained Attention: Impaired Sustained Attention Impairment: Functional basic Awareness: Impaired Awareness Impairment:  (? some awareness to aphasia) Problem Solving: Appears intact (for basic self feeding - holding  spoon - reaching for straw when moved away from her) Cognition Arousal/Alertness: Awake/alert Behavior During Therapy: Flat affect Overall Cognitive Status: Difficult to assess Area of Impairment: Following commands Following Commands: Follows one step commands inconsistently General Comments: Pt lethargic and with severe R inattention ; following limited commands.  No verbalizations today and no response by gestures (encouraged nods or thumbs up) Difficult to assess due to: Impaired communication (global aphasia)  Blood pressure 107/70, pulse 80, temperature 97.7 F (36.5 C), temperature source Oral, resp. rate 18, weight 83.6 kg, SpO2 100 %. Physical Exam Vitals and nursing note reviewed.  Constitutional:      Appearance: Normal appearance.     Comments: Pt sitting up in bed- obese pt- nonverbal except saying "yea and OK"- but didn't make sense, NAD  HENT:     Head: Normocephalic.     Comments: Severe R facial droop-  Cannot participate/follow commands to check tongue    Right Ear: External ear normal.     Left Ear: External ear normal.     Nose: Nose normal. No congestion.     Mouth/Throat:     Mouth: Mucous membranes are dry.     Pharynx: Oropharynx is clear. No oropharyngeal exudate.  Eyes:     General:        Right eye: No discharge.  Left eye: No discharge.     Comments: L gaze preference- wouldn't look Right  Cardiovascular:     Rate and Rhythm: Normal rate. Rhythm irregular.     Heart sounds: Normal heart sounds. No murmur heard. No gallop.   Pulmonary:     Comments: CTA B/L- no W/R/R- good air movement  Abdominal:     Comments: Obese, soft, NT, ND (+)BS  Genitourinary:    Comments: Has foley- medium amber urine Musculoskeletal:     Cervical back: Normal range of motion and neck supple.     Comments: Was not able to even move L side which appears to have strength to any commands- at all R side spontaneous movement of R hand when touched- 2-/5 biceps-  didn't recur. No other movements to tactile stimuli On R side.   Skin:    General: Skin is warm and dry.     Comments: Mild R hand swelling- propped on pillow   Neurological:     Mental Status: She is alert.     Comments: Right facial weakness with dense right hemiplegia. she was globally aphasic and made humming sounds. Unable to follow simple motor commands with visual or tactile cues.  Kept fidgeting with BP cuff attached to L side of bed- otherwise, no sign of movement- did hum in and out- repeated "yea"- but not appropriate to questions asked.  Awake, but not alert or appropriate  Psychiatric:     Comments: Unable to assess     Results for orders placed or performed during the hospital encounter of 10/17/20 (from the past 24 hour(s))  CBC     Status: Abnormal   Collection Time: 10/21/20  3:00 AM  Result Value Ref Range   WBC 6.1 4.0 - 10.5 K/uL   RBC 3.22 (L) 3.87 - 5.11 MIL/uL   Hemoglobin 10.4 (L) 12.0 - 15.0 g/dL   HCT 32.5 (L) 36.0 - 46.0 %   MCV 100.9 (H) 80.0 - 100.0 fL   MCH 32.3 26.0 - 34.0 pg   MCHC 32.0 30.0 - 36.0 g/dL   RDW 13.3 11.5 - 15.5 %   Platelets 101 (L) 150 - 400 K/uL   nRBC 0.0 0.0 - 0.2 %   No results found.   Assessment/Plan: Diagnosis: Large L MCA stroke with aphasia, dysphagia and impaired cognition. 1. Does the need for close, 24 hr/day medical supervision in concert with the patient's rehab needs make it unreasonable for this patient to be served in a less intensive setting? Potentially 2. Co-Morbidities requiring supervision/potential complications: Afib, EF <18%, CKD3A; COPD, HTN; depression 3. Due to bladder management, bowel management, safety, skin/wound care, disease management, medication administration, pain management and patient education, does the patient require 24 hr/day rehab nursing? Yes 4. Does the patient require coordinated care of a physician, rehab nurse, therapy disciplines of PT, OT, and SLP to address physical and  functional deficits in the context of the above medical diagnosis(es)? Potentially Addressing deficits in the following areas: balance, endurance, strength, transferring, bowel/bladder control, bathing, dressing, feeding, grooming, toileting, cognition, speech, language and swallowing 5. Can the patient actively participate in an intensive therapy program of at least 3 hrs of therapy per day at least 5 days per week? No 6. The potential for patient to make measurable gains while on inpatient rehab is fair and poor 7. Anticipated functional outcomes upon discharge from inpatient rehab are mod assist and max assist  with PT, mod assist and max assist with OT, mod assist  and max assist with SLP. 8. Estimated rehab length of stay to reach the above functional goals is: not sure pt is appropriate so far for CIR- unless family can do HEAVY mod-max assist 24/7.  9. Anticipated discharge destination: Other 10. Overall Rehab/Functional Prognosis: fair and poor  RECOMMENDATIONS: This patient's condition is appropriate for continued rehabilitative care in the following setting: N/A Patient has agreed to participate in recommended program. N/A Note that insurance prior authorization may be required for reimbursement for recommended care.  Comment:  1. Pt too low level for inpt Rehab as of today- if she improves and is able to follow commands at least 50% of time and  Interact more, then we possibly can admit- but she will likely need mod-Max Assist after d/c 24/7- and significant cognitive issues as well.  2. Will d/w admissions coordinators.  3. Suggest Ritalin 5 mg in AM and at lunch to see if that could help with attention/awareness.  4. Thank you for this consult.    Bary Leriche, PA-C 10/21/2020   I have personally performed a face to face diagnostic evaluation of this patient and formulated the key components of the plan.  Additionally, I have personally reviewed laboratory data, imaging studies,  as well as relevant notes and concur with the physician assistant's documentation above.

## 2020-10-22 LAB — CBC
HCT: 35 % — ABNORMAL LOW (ref 36.0–46.0)
Hemoglobin: 11.4 g/dL — ABNORMAL LOW (ref 12.0–15.0)
MCH: 32.8 pg (ref 26.0–34.0)
MCHC: 32.6 g/dL (ref 30.0–36.0)
MCV: 100.6 fL — ABNORMAL HIGH (ref 80.0–100.0)
Platelets: 112 10*3/uL — ABNORMAL LOW (ref 150–400)
RBC: 3.48 MIL/uL — ABNORMAL LOW (ref 3.87–5.11)
RDW: 13.5 % (ref 11.5–15.5)
WBC: 6.2 10*3/uL (ref 4.0–10.5)
nRBC: 0 % (ref 0.0–0.2)

## 2020-10-22 LAB — BASIC METABOLIC PANEL
Anion gap: 10 (ref 5–15)
BUN: 17 mg/dL (ref 8–23)
CO2: 19 mmol/L — ABNORMAL LOW (ref 22–32)
Calcium: 9 mg/dL (ref 8.9–10.3)
Chloride: 111 mmol/L (ref 98–111)
Creatinine, Ser: 0.8 mg/dL (ref 0.44–1.00)
GFR, Estimated: >60 mL/min (ref >60)
Glucose, Bld: 104 mg/dL — ABNORMAL HIGH (ref 70–99)
Potassium: 4.1 mmol/L (ref 3.5–5.1)
Sodium: 140 mmol/L (ref 135–145)

## 2020-10-22 MED ORDER — METHYLPHENIDATE HCL 5 MG PO TABS
5.0000 mg | ORAL_TABLET | Freq: Two times a day (BID) | ORAL | Status: DC
  Administered 2020-10-23 – 2020-10-31 (×18): 5 mg via ORAL
  Filled 2020-10-22 (×18): qty 1

## 2020-10-22 MED ORDER — APIXABAN 5 MG PO TABS
5.0000 mg | ORAL_TABLET | Freq: Two times a day (BID) | ORAL | Status: DC
  Administered 2020-10-22 – 2020-10-31 (×19): 5 mg via ORAL
  Filled 2020-10-22 (×19): qty 1

## 2020-10-22 MED ORDER — METHYLPHENIDATE HCL 5 MG PO TABS
5.0000 mg | ORAL_TABLET | Freq: Every morning | ORAL | Status: DC
  Administered 2020-10-22: 5 mg via ORAL
  Filled 2020-10-22: qty 1

## 2020-10-22 NOTE — Discharge Instructions (Signed)

## 2020-10-22 NOTE — Progress Notes (Signed)
PROGRESS NOTE    Angelica Strickland  WCB:[id number] DOB: 06/01/1955 DOA: 10/17/2020 PCP: Birdie Sons, MD   Brief Narrative:   Angelica Strickland is a 65 y.o. female with past medical history of chronic systolic heart failure with latest left ventricular ejection fraction of 20%, paroxysmal atrial fibrillation not on anticoagulation, thrombocytopenia, hypertension, COPD presented to the hospital on 10/17/2020 with acute ischemic stroke with right-sided facial droop and right-sided weakness.  Patient did have expressive aphasia.  Patient was then admitted hospital for management of acute stroke.  Has been seen by rehabilitation team who recommended CIR.  Assessment & Plan   Acute ischemic CVA CT scan showing left MCA infarct with left M1 occlusion on the CT angiogram.  2D echocardiogram with EF of 20 to 25%.  Hemoglobin A1c of 5.3.  Continue aspirin and statin.  PT and OT recommends CIR.  Speech therapy has seen the patient and recommended dysphagia 1 diet with nectar thick liquids.  Neurology was consulted and recommended initiating Eliquis on June 1,  5 days poststroke if platelets remain stable.  Spoke with neurology, will start the patient on Eliquis.  Cervical ligamentous injury -MRI showed some prevertebral fluid.  Neurosurgery was consulted.  It was okay to remove the cervical collar.   Essential hypertension Antihypertensives on hold currently.  Latest blood pressure of 106/75.  COPD Currently compensated.  Continue albuterol.  Chronic systolic heart failure 2D echocardiogram on 10/19/2020 showed an EF of less than 20% patient is currently euvolemic and compensated.  Currently lisinopril, Coreg and torsemide on hold.  Paroxysmal atrial fibrillation CHA2DS2-VASc of 6.  Patient was not on anticoagulation due to history of thrombocytopenia.  Plan was to start Eliquis on 10/22/2020 if platelets remain stable.  Communicated with neurology Dr Erlinda Hong and he recommends starting  tonight.  Thrombocytopenia Chronic.  Latest platelet count of 112.  Monitor for bleeding.  Chronic kidney disease, stage IIIa Continue to monitor creatinine.  Monitor BMP closely.  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  No acute intervention needed at this time.  Deconditioning debility status post stroke.  Physical therapy has seen and recommended CIR.  CIR team recommends Ritalin for alertness.   DVT Prophylaxis Lovenox subcu  Code Status: Full  Family Communication: None at bedside  Disposition Plan:  Status is: Inpatient  Remains inpatient appropriate because:IV treatments appropriate due to intensity of illness or inability to take PO and Inpatient level of care appropriate due to severity of illness   Dispo: The patient is from: Home              Anticipated d/c is to: CIR              Patient currently is not medically stable to d/c.   Difficult to place patient No   Consultants Neurology  Inpatient rehab  Procedures  Echocardiogram  Antibiotics   Anti-infectives (From admission, onward)   None      Subjective:   Today, patient was seen and examined at bedside.  Appears to be sleepy,  Ill-appearing.  Not much interactive. Objective:   Vitals:   10/22/20 0500 10/22/20 0641 10/22/20 0759 10/22/20 1140  BP:  118/84 103/81 106/75  Pulse:  (!) 102 85 95  Resp:  20  16  Temp:  97.6 F (36.4 C) 98 F (36.7 C) 97.6 F (36.4 C)  TempSrc:  Oral Oral Oral  SpO2:  100% 98% 100%  Weight: 86.8 kg  Intake/Output Summary (Last 24 hours) at 10/22/2020 1412 Last data filed at 10/22/2020 0438 Gross per 24 hour  Intake 2538.22 ml  Output --  Net 2538.22 ml   Filed Weights   10/20/20 0341 10/21/20 0353 10/22/20 0500  Weight: 83.3 kg 83.6 kg 86.8 kg    Physical exam General: Obese built, not in obvious distress, mildly somnolent, aphasic, ill-appearing, HENT:   No scleral pallor or icterus noted.  Oral mucosa is moist.  Chest:  Clear breath sounds.  Diminished breath sounds bilaterally. No crackles or wheezes.  CVS: S1 &S2 heard.  Systolic murmur noted, irregular rhythm Abdomen: Soft, nontender, nondistended.  bowel sounds are heard.  Foley catheter in place with Extremities: No cyanosis, clubbing or edema.  Peripheral pulses are palpable. Psych: Mildly somnolent,   CNS: Mildly somnolent,,right-sided facial weakness, right-sided weakness.  Left gaze preference. Skin: Warm and dry.  No rashes noted.  Data Reviewed: I have personally reviewed the following lab and imaging studies   CBC: Recent Labs  Lab 10/17/20 2322 10/18/20 0554 10/19/20 0358 10/20/20 0138 10/21/20 0300 10/22/20 0311  WBC 7.3 7.1 6.1 7.0 6.1 6.2  NEUTROABS 4.9  --   --   --   --   --   HGB 13.4 12.8 11.3* 11.1* 10.4* 11.4*  HCT 40.5 38.7 34.7* 33.8* 32.5* 35.0*  MCV 99.0 98.7 100.9* 101.8* 100.9* 100.6*  PLT 144* 130* 105* 98* 101* 357*   Basic Metabolic Panel: Recent Labs  Lab 10/17/20 2322 10/18/20 0554 10/19/20 0358 10/22/20 0311  NA 135 138 139 140  K 4.4 4.3 4.0 4.1  CL 104 108 113* 111  CO2 23 23 18* 19*  GLUCOSE 103* 89 92 104*  BUN 28* 25* 20 17  CREATININE 1.21* 1.10* 0.96 0.80  CALCIUM 9.2 8.9 8.5* 9.0  MG  --  2.0  --   --    GFR: CrCl cannot be calculated (Unknown ideal weight.). Liver Function Tests: Recent Labs  Lab 10/17/20 2322  AST 17  ALT 10  ALKPHOS 51  BILITOT 1.3*  PROT 6.7  ALBUMIN 3.8   No results for input(s): LIPASE, AMYLASE in the last 168 hours. No results for input(s): AMMONIA in the last 168 hours. Coagulation Profile: Recent Labs  Lab 10/17/20 2322  INR 1.2   Cardiac Enzymes: No results for input(s): CKTOTAL, CKMB, CKMBINDEX, TROPONINI in the last 168 hours. BNP (last 3 results) No results for input(s): PROBNP in the last 8760 hours. HbA1C: No results for input(s): HGBA1C in the last 72 hours. CBG: No results for input(s): GLUCAP in the last  168 hours. Lipid Profile: No results for input(s): CHOL, HDL, LDLCALC, TRIG, CHOLHDL, LDLDIRECT in the last 72 hours. Thyroid Function Tests: No results for input(s): TSH, T4TOTAL, FREET4, T3FREE, THYROIDAB in the last 72 hours. Anemia Panel: No results for input(s): VITAMINB12, FOLATE, FERRITIN, TIBC, IRON, RETICCTPCT in the last 72 hours. Urine analysis:    Component Value Date/Time   COLORURINE YELLOW 10/17/2020 2324   APPEARANCEUR CLEAR 10/17/2020 2324   APPEARANCEUR Clear 10/11/2013 1747   LABSPEC 1.015 10/17/2020 2324   LABSPEC 1.008 10/11/2013 1747   PHURINE 5.0 10/17/2020 2324   GLUCOSEU NEGATIVE 10/17/2020 2324   GLUCOSEU Negative 10/11/2013 1747   HGBUR NEGATIVE 10/17/2020 2324   BILIRUBINUR NEGATIVE 10/17/2020 2324   BILIRUBINUR Negative 10/11/2013 1747   KETONESUR NEGATIVE 10/17/2020 2324   PROTEINUR NEGATIVE 10/17/2020 2324   NITRITE NEGATIVE 10/17/2020 2324   LEUKOCYTESUR NEGATIVE 10/17/2020 2324   LEUKOCYTESUR 2+  10/11/2013 1747   Sepsis Labs: @LABRCNTIP (procalcitonin:4,lacticidven:4)  ) Recent Results (from the past 240 hour(s))  Resp Panel by RT-PCR (Flu A&B, Covid) Urine, Catheterized     Status: None   Collection Time: 10/17/20 11:24 PM   Specimen: Urine, Catheterized; Nasopharyngeal(NP) swabs in vial transport medium  Result Value Ref Range Status   SARS Coronavirus 2 by RT PCR NEGATIVE NEGATIVE Final    Comment: (NOTE) SARS-CoV-2 target nucleic acids are NOT DETECTED.  The SARS-CoV-2 RNA is generally detectable in upper respiratory specimens during the acute phase of infection. The lowest concentration of SARS-CoV-2 viral copies this assay can detect is 138 copies/mL. A negative result does not preclude SARS-Cov-2 infection and should not be used as the sole basis for treatment or other patient management decisions. A negative result may occur with  improper specimen collection/handling, submission of specimen other than nasopharyngeal swab,  presence of viral mutation(s) within the areas targeted by this assay, and inadequate number of viral copies(<138 copies/mL). A negative result must be combined with clinical observations, patient history, and epidemiological information. The expected result is Negative.  Fact Sheet for Patients:  EntrepreneurPulse.com.au  Fact Sheet for Healthcare Providers:  IncredibleEmployment.be  This test is no t yet approved or cleared by the [place] FDA and  has been authorized for detection and/or diagnosis of SARS-CoV-2 by FDA under an Emergency Use Authorization (EUA). This EUA will remain  in effect (meaning this test can be used) for the duration of the COVID-19 declaration under Section 564(b)(1) of the Act, 21 U.S.C.section 360bbb-3(b)(1), unless the authorization is terminated  or revoked sooner.       Influenza A by PCR NEGATIVE NEGATIVE Final   Influenza B by PCR NEGATIVE NEGATIVE Final    Comment: (NOTE) The Xpert Xpress SARS-CoV-2/FLU/RSV plus assay is intended as an aid in the diagnosis of influenza from Nasopharyngeal swab specimens and should not be used as a sole basis for treatment. Nasal washings and aspirates are unacceptable for Xpert Xpress SARS-CoV-2/FLU/RSV testing.  Fact Sheet for Patients: EntrepreneurPulse.com.au  Fact Sheet for Healthcare Providers: IncredibleEmployment.be  This test is not yet approved or cleared by the [place] FDA and has been authorized for detection and/or diagnosis of SARS-CoV-2 by FDA under an Emergency Use Authorization (EUA). This EUA will remain in effect (meaning this test can be used) for the duration of the COVID-19 declaration under Section 564(b)(1) of the Act, 21 U.S.C. section 360bbb-3(b)(1), unless the authorization is terminated or revoked.  Performed at [facility], [place] [address], [place], [place] [address]   Urine culture      Status: None   Collection Time: 10/17/20 11:24 PM   Specimen: Urine, Catheterized  Result Value Ref Range Status   Specimen Description URINE, CATHETERIZED  Final   Special Requests NONE  Final   Culture   Final    NO GROWTH Performed at [facility], [address] [place], [place] [address]    Report Status 10/19/2020 FINAL  Final      Radiology Studies: No results found.   Scheduled Meds: .  stroke: mapping our early stages of recovery book   Does not apply Once  . aspirin  324 mg Oral Daily   Or  . aspirin  300 mg Rectal Daily  . atorvastatin  40 mg Oral Daily  . Chlorhexidine Gluconate Cloth  6 each Topical Daily  . enoxaparin (LOVENOX) injection  40 mg Subcutaneous QHS  . mouth rinse  15  mL Mouth Rinse BID  . methylphenidate  5 mg Oral q AM   Continuous Infusions: . sodium chloride 75 mL/hr at 10/22/20 0438     LOS: 4 days    Flora Lipps MD  10/22/2020 at 2:12 PM [facility] Office  [phone]

## 2020-10-22 NOTE — Progress Notes (Signed)
  Speech Language Pathology Treatment: Dysphagia;Cognitive-Linquistic (Aphasia)  Patient Details Name: Angelica Strickland MRN: [id number] DOB: December 29, 1955 Today's Date: 10/22/2020 Time: 6010-9323 SLP Time Calculation (min) (ACUTE ONLY): 25.3 min  Assessment / Plan / Recommendation Clinical Impression  Pt was seen for dysphagia treatment. She was cooperative during the session with intermittent difficulty maintaining alertness. Pt's RN, Evelena Peat, reported that the pt has been tolerating the current diet and ate 90% of lunch. No s/sx of aspiration were noted with puree solids, dysphagia 2 solids or with thin liquids via cup. Anterior spillage was noted to the right. Mastication was only intermittently noted with dysphagia 2 solids and pt was noted to fall asleep during mastication. Pt's current diet of dys 1 and thin liquids via cup will be continued. Pt was able to produce some numbers during production of automatic sequences, but only some were noted to be in the accurate sequence and perseveration was demonstrated. She achieved 20% accuracy with confrontational naming increasing to 100% with semantic and part-word cues. She demonstrated 0% accuracy with picture identification from a field of two despite verbal prompts. She responded to simple yes/no questions with 60% accuracy; however, all responses were affirmative; use of yes/no questions to assess auditory comprehension of education is therefore cautioned. Pt independently produced "yes" and "turn it off" during the session, but no other phrase-level utternances were noted. SLP will continue to follow pt.    HPI HPI: Pt is a 65 yo adm to [facility] after recent fall, new onset of right side weakness, facial asymmetery and aphasia.  Imaging of brain showed moderately large acute to subacute CVA due to Left M1 occlusion.  Pt also with remote bilateral cerebellar cvas.  Cervical spine imaging showed osteophytic changes C5-C6, C6-C7.  PMH + for pituitary tumor, COPD,  CHF, smoking. Pt failed [facility] swallow screen.      SLP Plan  Continue with current plan of care       Recommendations  Diet recommendations: Dysphagia 1 (puree);Thin liquid Liquids provided via: Cup;No straw Medication Administration: Crushed with puree Supervision: Full supervision/cueing for compensatory strategies;Staff to assist with self feeding Compensations: Minimize environmental distractions;Slow rate;Small sips/bites;Follow solids with liquid Postural Changes and/or Swallow Maneuvers: Seated upright 90 degrees                Oral Care Recommendations: Oral care BID Follow up Recommendations: Skilled Nursing facility SLP Visit Diagnosis: Dysphagia, oropharyngeal phase (R13.12);Aphasia (R47.01) Plan: Continue with current plan of care       Elese Rane I. Hardin Negus, [place], [place] Office number [phone] Pager [place] 10/22/2020, 4:56 PM

## 2020-10-22 NOTE — Progress Notes (Signed)
Patient has voided and is now on a purewick.

## 2020-10-22 NOTE — Progress Notes (Signed)
Physical Therapy Treatment Patient Details Name: Angelica Strickland MRN: [id number] DOB: 29-Jan-1956 Today's Date: 10/22/2020    History of Present Illness 65 y/o female presented to ED on 5/27 after being found down with R facial droop, R arm and leg flaccid, and L gaze preference. CT head and MRI found L MCA infarct. PMH: CHF, Afib, HTN, COPD, depression    PT Comments    Patient continues to progress towards physical therapy goals. Patient following <50% commands this session but requires increased time to process at times. Patient required mod-maxA+2 for sit to stand transfer, increasing assist with fatigue. Patient took side steps towards recliner on L with maxA+2 but unable to advance R LE due to weakness. Patient with some verbalizations this session but continues to be limited by global aphasia. Continue to recommend comprehensive inpatient rehab (CIR) for post-acute therapy needs.     Follow Up Recommendations  CIR     Equipment Recommendations  Other (comment) (defer to post acute rehab)    Recommendations for Other Services       Precautions / Restrictions Precautions Precautions: Fall;Other (comment) Precaution Comments: aphasia Restrictions Weight Bearing Restrictions: No    Mobility  Bed Mobility Overal bed mobility: Needs Assistance Bed Mobility: Supine to Sit     Supine to sit: Mod assist;+2 for physical assistance     General bed mobility comments: Patient moving L side toward EOB but not attending to R side. Required assist with R side due to hemiplegia. Patient initially minA to maintain sitting balance progressing to close supervision-min guard    Transfers Overall transfer level: Needs assistance Equipment used: 2 person hand held assist Transfers: Sit to/from Stand Sit to Stand: Mod assist;+2 physical assistance;+2 safety/equipment;Max assist         General transfer comment: Sit to stand x 4 throughout session. ModA+2 for initial sit to stand to  rise and support trunk for balance. Subsequent stands required up to maxA+2 to stand. During each stand, cues and facilitation for upright posture at pelvis and shoulder. Blocking of R knee to prevent buckling. No muscle activation noted during standing  Ambulation/Gait Ambulation/Gait assistance: Max assist;+2 physical assistance;+2 safety/equipment Gait Distance (Feet): 2 Feet Assistive device: 2 person hand held assist Gait Pattern/deviations: Step-to pattern Gait velocity: decreased   General Gait Details: side steps towards L to recliner. Assist for balance, weight shifting, and advancement of R LE as no muscle activation noted. Patient able to advance L LE towards recliner.   Stairs             Wheelchair Mobility    Modified Rankin (Stroke Patients Only) Modified Rankin (Stroke Patients Only) Pre-Morbid Rankin Score: No symptoms Modified Rankin: Severe disability     Balance Overall balance assessment: Needs assistance Sitting-balance support: Single extremity supported;Feet supported Sitting balance-Leahy Scale: Poor Sitting balance - Comments: With no UE support able to maintain EOB with close supervision.  Improved ability to maintain sitting balance without UE support this session                                    Cognition Arousal/Alertness: Awake/alert Behavior During Therapy: Flat affect Overall Cognitive Status: Difficult to assess Area of Impairment: Following commands                       Following Commands: Follows one step commands inconsistently;Follows one step commands with increased time  General Comments: Awake/alert but easily fatigued this session. Following <50% of commands during session. Allowed opportunities for verbalization attempts with biographical yes/no questions to test accuracy of attempts      Exercises      General Comments        Pertinent Vitals/Pain Pain Assessment: Faces Faces Pain  Scale: No hurt Pain Intervention(s): Monitored during session    Home Living                      Prior Function            PT Goals (current goals can now be found in the care plan section) Acute Rehab PT Goals Patient Stated Goal: unable to state PT Goal Formulation: Patient unable to participate in goal setting Time For Goal Achievement: 11/01/20 Potential to Achieve Goals: Fair Progress towards PT goals: Progressing toward goals    Frequency    Min 4X/week      PT Plan Current plan remains appropriate    Co-evaluation              AM-PAC PT "6 Clicks" Mobility   Outcome Measure  Help needed turning from your back to your side while in a flat bed without using bedrails?: A Lot Help needed moving from lying on your back to sitting on the side of a flat bed without using bedrails?: Total Help needed moving to and from a bed to a chair (including a wheelchair)?: Total Help needed standing up from a chair using your arms (e.g., wheelchair or bedside chair)?: Total Help needed to walk in hospital room?: Total Help needed climbing 3-5 steps with a railing? : Total 6 Click Score: 7    End of Session Equipment Utilized During Treatment: Gait belt Activity Tolerance: Patient tolerated treatment well Patient left: in chair;with call bell/phone within reach;with chair alarm set Nurse Communication: Mobility status;Other (comment) (need for feeding and purewick) PT Visit Diagnosis: Unsteadiness on feet (R26.81);Muscle weakness (generalized) (M62.81);Other abnormalities of gait and mobility (R26.89);Difficulty in walking, not elsewhere classified (R26.2);Other symptoms and signs involving the nervous system (Y60.630)     Time: 1601-0932 PT Time Calculation (min) (ACUTE ONLY): 30 min  Charges:  $Therapeutic Activity: 8-22 mins $Neuromuscular Re-education: 8-22 mins                     Mionna Advincula A. Gilford Rile PT, DPT Acute Rehabilitation Services Pager  [phone] Office [phone]    Linna Hoff 10/22/2020, 3:29 PM

## 2020-10-22 NOTE — Progress Notes (Signed)
ANTICOAGULATION CONSULT NOTE - Initial Consult  Pharmacy Consult for Apixaban Indication: atrial fibrillation  Allergies  Allergen Reactions  . 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 Clonidine Derivatives Rash    Patient Measurements: Weight: 86.8 kg (191 lb 5.8 oz)  Vital Signs: Temp: 97.6 F (36.4 C) (06/01 1140) Temp Source: Oral (06/01 1140) BP: 106/75 (06/01 1140) Pulse Rate: 95 (06/01 1140)  Labs: Recent Labs    10/20/20 0138 10/21/20 0300 10/22/20 0311  HGB 11.1* 10.4* 11.4*  HCT 33.8* 32.5* 35.0*  PLT 98* 101* 112*  CREATININE  --   --  0.80    CrCl cannot be calculated (Unknown ideal weight.).   Medical History: Past Medical History:  Diagnosis Date  . Atrial fibrillation ([place]) 11/11/2014  . Carpal tunnel syndrome 11/11/2014  . Congestive heart failure ([place]) 11/11/2014  . COPD (chronic obstructive pulmonary disease) ([place]) 11/11/2014  . Depression 11/11/2014  . Essential hypertension 11/11/2014  . History of pituitary tumor 11/11/2014  . Hypopotassemia 11/11/2014  . Osteoarthritis 11/11/2014  . Thrombocytopenia ([place]) 11/11/2014    Assessment: 65 yo F presented on 10/17/2020 with L M1 occlusion. Patient with Afib prior to admission and not on anticoagulation due to thrombocytopenia. Neurology recommended starting anticoagulation on 6/1 (5 days post stroke) if plts stable. Pharmacy consulted to dose apixaban. Age < 25 years, wt > 60kg, Scr <1.5. Patient does not meet criteria for dose reduction - discussed with Dr. Erlinda Hong. Plt 112 - 144 on admission. Baseline plts ~ 130-140s.   Goal of Therapy:  Monitor platelets by anticoagulation protocol: Yes   Plan:   Start apixaban 5mg  twice daily  Monitor platelets Pharmacy to provide education Case management consult placed to assess cost of apixaban  Cristela Felt, PharmD Clinical Pharmacist   10/22/2020,3:24 PM

## 2020-10-22 NOTE — Progress Notes (Signed)
Paged doctor about patient a.fib being uncontrolled ranging from 100-140 with the highest HR of 140 sustaining for 5 minutes. Night MD was contacted and discussed possible PRN or one time dose but at the time of the call @ 0645, the patients HR was in 80-100.   Passed along notice to day shift RN

## 2020-10-22 NOTE — Progress Notes (Signed)
Inpatient Rehabilitation Admissions Coordinator  I met with patient with her sister, Angelica Strickland, at bedside. We discussed goals and expectations of a possible CIR admit. Angelica Strickland prefers CIR admit and then for patient to discharge to her home where she and family can provide 24/7 physical care. I have begun insurance Auth with [facility] medicare yesterday. I await their r approval for possible admit.  Danne Baxter, RN, MSN Rehab Admissions Coordinator [phone] 10/22/2020 10:38 AM

## 2020-10-23 MED ORDER — METOPROLOL TARTRATE 5 MG/5ML IV SOLN
2.5000 mg | Freq: Once | INTRAVENOUS | Status: AC
  Administered 2020-10-23: 2.5 mg via INTRAVENOUS
  Filled 2020-10-23: qty 5

## 2020-10-23 MED ORDER — LORAZEPAM 0.5 MG PO TABS
0.5000 mg | ORAL_TABLET | Freq: Once | ORAL | Status: AC
  Administered 2020-10-23: 0.5 mg via ORAL
  Filled 2020-10-23: qty 1

## 2020-10-23 MED ORDER — CARVEDILOL 12.5 MG PO TABS
12.5000 mg | ORAL_TABLET | Freq: Two times a day (BID) | ORAL | Status: DC
  Administered 2020-10-23 – 2020-10-31 (×16): 12.5 mg via ORAL
  Filled 2020-10-23 (×18): qty 1

## 2020-10-23 NOTE — Progress Notes (Signed)
HOSPITAL MEDICINE OVERNIGHT EVENT NOTE    Patient exhibiting both rapid atrial fibrillation with heart rates ranging between 120 and 160 in addition to bouts of agitation.  Unclear as to whether or not the atrial fibrillation is causing patient to be agitated or vice versa.  Attempting to avoid excessive dosing of sedating agents as it is currently 5 AM.  We will provide patient a combination of 2.5 mg of intravenous metoprolol and 0.5 mg of oral Ativan and monitor for response.  Angelica Emerald  MD [facility]

## 2020-10-23 NOTE — TOC Benefit Eligibility Note (Signed)
Transition of Care [facility]) Benefit Eligibility Note    Patient Details  Name: Angelica Strickland MRN: [id number] Date of Birth: 1955-07-06   Medication/Dose: Eliquis 5mg . BID for 30 day supply  Covered?: Yes  Tier: 3 Drug  Prescription Coverage [facility]erred [facility]: [facility]  Spoke with Person/Company/Phone Number:: Shirlee Limerick L. W/[employer] help desk PH# [phone]  Co-Pay: Zero  Prior Approval: No  Deductible:  (?)       Shelda Altes Phone Number: 10/23/2020, 8:43 AM

## 2020-10-23 NOTE — Plan of Care (Signed)

## 2020-10-23 NOTE — Progress Notes (Signed)
#  1 Overnight event   Patient sustaining heart rate of 120-160. Patient was seen throwing purewick on the floor and being agitated repeatedly shaking her head "no". When asked her reason for taking the purewick off, she simply "not right now". Patient does seem to get agitated with patient care especially when being cleaned or during vital signs.   Patient does willingly take medications with puree when prompted.   Night MD (Shalhoub) was paged and by the time I was talking to MD, the patient's HR returned to 80s-102 bpm.  Shalhoub MD advised to keep monitoring and treating for  Agitation with PRN ativan.

## 2020-10-23 NOTE — Progress Notes (Signed)
Physical Therapy Treatment Patient Details Name: Angelica Strickland MRN: [id number] DOB: Aug 26, 1955 Today's Date: 10/23/2020    History of Present Illness 65 y/o female presented to ED on 5/27 after being found down with R facial droop, R arm and leg flaccid, and L gaze preference. CT head and MRI found L MCA infarct. PMH: CHF, Afib, HTN, COPD, depression    PT Comments    Patient limited by lethargy this session potentially due to medications. Patient with little initiation for movement towards EOB requiring totalA+2 to complete. Patient required max-totalA to maintain sitting EOB due to pushing posteriorly with L UE, at times minA depending on mood. Visible frustration on patient's face noted. Deferred standing this session. Following <25% commands this session. Continue to recommend comprehensive inpatient rehab (CIR) for post-acute therapy needs.     Follow Up Recommendations  CIR     Equipment Recommendations  Other (comment) (TBD)    Recommendations for Other Services       Precautions / Restrictions Precautions Precautions: Fall;Other (comment) Precaution Comments: aphasia Restrictions Weight Bearing Restrictions: No    Mobility  Bed Mobility Overal bed mobility: Needs Assistance Bed Mobility: Supine to Sit;Sit to Supine Rolling: Total assist;+2 for physical assistance;+2 for safety/equipment   Supine to sit: Total assist;+2 for physical assistance;+2 for safety/equipment Sit to supine: Total assist;+2 for physical assistance;+2 for safety/equipment   General bed mobility comments: totalA+2 for all bed mobility with no initiation from patient due to lethargy    Transfers                 General transfer comment: deferred  Ambulation/Gait                 Stairs             Wheelchair Mobility    Modified Rankin (Stroke Patients Only) Modified Rankin (Stroke Patients Only) Pre-Morbid Rankin Score: No symptoms Modified Rankin: Severe  disability     Balance Overall balance assessment: Needs assistance Sitting-balance support: Single extremity supported;Feet supported Sitting balance-Leahy Scale: Poor Sitting balance - Comments: max-totalA to maintain sitting balance with times of minA. Pushing posteriorly with L UE at times Postural control: Right lateral lean;Posterior lean                                  Cognition Arousal/Alertness: Lethargic;Suspect due to medications Behavior During Therapy: Flat affect Overall Cognitive Status: Difficult to assess Area of Impairment: Following commands                       Following Commands: Follows one step commands inconsistently;Follows one step commands with increased time       General Comments: Lethargic but suspect due to medications given. Following <25% commands this session with vivid frustration noted on face      Exercises      General Comments        Pertinent Vitals/Pain Pain Assessment: Faces Faces Pain Scale: No hurt Pain Intervention(s): Monitored during session    Home Living                      Prior Function            PT Goals (current goals can now be found in the care plan section) Acute Rehab PT Goals Patient Stated Goal: unable to state PT Goal Formulation: Patient unable to participate in  goal setting Time For Goal Achievement: 11/01/20 Potential to Achieve Goals: Fair Progress towards PT goals: Progressing toward goals    Frequency    Min 4X/week      PT Plan Current plan remains appropriate    Co-evaluation PT/OT/SLP Co-Evaluation/Treatment: Yes Reason for Co-Treatment: For patient/therapist safety;To address functional/ADL transfers PT goals addressed during session: Mobility/safety with mobility;Balance        AM-PAC PT "6 Clicks" Mobility   Outcome Measure  Help needed turning from your back to your side while in a flat bed without using bedrails?: Total Help needed  moving from lying on your back to sitting on the side of a flat bed without using bedrails?: Total Help needed moving to and from a bed to a chair (including a wheelchair)?: Total Help needed standing up from a chair using your arms (e.g., wheelchair or bedside chair)?: Total Help needed to walk in hospital room?: Total Help needed climbing 3-5 steps with a railing? : Total 6 Click Score: 6    End of Session   Activity Tolerance: Patient tolerated treatment well Patient left: in bed;with call bell/phone within reach;with bed alarm set Nurse Communication: Mobility status PT Visit Diagnosis: Unsteadiness on feet (R26.81);Muscle weakness (generalized) (M62.81);Other abnormalities of gait and mobility (R26.89);Difficulty in walking, not elsewhere classified (R26.2);Other symptoms and signs involving the nervous system (Y09.983)     Time: 3825-0539 PT Time Calculation (min) (ACUTE ONLY): 26 min  Charges:  $Therapeutic Activity: 8-22 mins                     Ginnette Gates A. Gilford Rile PT, DPT Acute Rehabilitation Services Pager [phone] Office [phone]    Linna Hoff 10/23/2020, 4:42 PM

## 2020-10-23 NOTE — Progress Notes (Signed)
Occupational Therapy Treatment Patient Details Name: Angelica Strickland MRN: [id number] DOB: [birth year] Today's Date: 10/23/2020    History of present illness 65 y/o female presented to ED on 5/27 after being found down with R facial droop, R arm and leg flaccid, and L gaze preference. CT head and MRI found L MCA infarct. PMH: CHF, Afib, HTN, COPD, depression   OT comments  Session limited by impaired level of arousal. Pt following <25% of commands this session needing total A +2 for bed mobility and up to total A for static sitting balance EOB. Pt was able to wash her face with MAX multimodal cues, unable to cross midline to rub in lotion on pts R arm. Pt would continue to benefit from skilled occupational therapy while admitted and after d/c to address the below listed limitations in order to improve overall functional mobility and facilitate independence with BADL participation. DC plan remains appropriate, will follow acutely per POC.    Follow Up Recommendations  CIR    Equipment Recommendations  Other (comment) (TBD)    Recommendations for Other Services      Precautions / Restrictions Precautions Precautions: Fall;Other (comment) Precaution Comments: aphasia Restrictions Weight Bearing Restrictions: No       Mobility Bed Mobility Overal bed mobility: Needs Assistance Bed Mobility: Supine to Sit;Sit to Supine Rolling: Total assist;+2 for physical assistance;+2 for safety/equipment   Supine to sit: Total assist;+2 for physical assistance;+2 for safety/equipment Sit to supine: Total assist;+2 for physical assistance;+2 for safety/equipment   General bed mobility comments: totalA+2 for all bed mobility with no initiation from patient due to lethargy    Transfers                 General transfer comment: deferred    Balance Overall balance assessment: Needs assistance Sitting-balance support: Single extremity supported;Feet supported Sitting balance-Leahy Scale:  Poor Sitting balance - Comments: max-totalA to maintain sitting balance with times of minA. Pushing posteriorly with L UE at times Postural control: Right lateral lean;Posterior lean                                 ADL either performed or assessed with clinical judgement   ADL Overall ADL's : Needs assistance/impaired     Grooming: Maximal assistance;Wash/dry face;Set up Grooming Details (indicate cue type and reason): initially needing MAX multimodal cues but progressing to set- up Upper Body Bathing: Maximal assistance;Sitting Upper Body Bathing Details (indicate cue type and reason): simulated via applying lotion to pts R arm, unable to follow commands to attend to R side               Toilet Transfer Details (indicate cue type and reason): deferred d/t level of arousal         Functional mobility during ADLs: Total assistance;+2 for physical assistance (bed mobility only) General ADL Comments: pt able to wash face with max- set- up assist, limited by lethargy this session     Vision   Additional Comments: spontaneously gazing to R this session   Perception     Praxis      Cognition Arousal/Alertness: Lethargic;Suspect due to medications Behavior During Therapy: Flat affect Overall Cognitive Status: Difficult to assess Area of Impairment: Following commands                       Following Commands: Follows one step commands inconsistently;Follows one step commands with  increased time       General Comments: Lethargic but suspect due to medications given. Following <25% commands this session with vivid frustration noted on face        Exercises     Shoulder Instructions       General Comments      Pertinent Vitals/ Pain       Pain Assessment: Faces Faces Pain Scale: No hurt Pain Intervention(s): Monitored during session  Home Living                                          Prior Functioning/Environment               Frequency  Min 2X/week        Progress Toward Goals  OT Goals(current goals can now be found in the care plan section)  Progress towards OT goals: Progressing toward goals  Acute Rehab OT Goals Patient Stated Goal: unable to state OT Goal Formulation: Patient unable to participate in goal setting Time For Goal Achievement: 11/01/20  Plan Discharge plan remains appropriate;Frequency remains appropriate    Co-evaluation      Reason for Co-Treatment: For patient/therapist safety;To address functional/ADL transfers PT goals addressed during session: Mobility/safety with mobility;Balance OT goals addressed during session: ADL's and self-care      AM-PAC OT "6 Clicks" Daily Activity     Outcome Measure   Help from another person eating meals?: Total Help from another person taking care of personal grooming?: A Lot Help from another person toileting, which includes using toliet, bedpan, or urinal?: Total Help from another person bathing (including washing, rinsing, drying)?: Total Help from another person to put on and taking off regular upper body clothing?: Total Help from another person to put on and taking off regular lower body clothing?: Total 6 Click Score: 7    End of Session    OT Visit Diagnosis: Other abnormalities of gait and mobility (R26.89);Muscle weakness (generalized) (M62.81);Cognitive communication deficit (R41.841);Other symptoms and signs involving cognitive function;Hemiplegia and hemiparesis Hemiplegia - Right/Left: Right   Activity Tolerance Patient limited by lethargy   Patient Left in bed;with call bell/phone within reach;with bed alarm set   Nurse Communication Mobility status        Time: 1450-1515 OT Time Calculation (min): 25 min  Charges: OT General Charges $OT Visit: 1 Visit OT Treatments $Self Care/Home Management : 8-22 mins Harley Alto., COTA/L Acute Rehabilitation  Services [phone]    Precious Haws 10/23/2020, 4:49 PM

## 2020-10-23 NOTE — Progress Notes (Signed)
PROGRESS NOTE    Angelica Strickland  ZES:[id number] DOB: 06-Jul-1955 DOA: 10/17/2020 PCP: Birdie Sons, MD   Brief Narrative:   Angelica Strickland is a 65 y.o. female with past medical history of chronic systolic heart failure with latest left ventricular ejection fraction of 20%, paroxysmal atrial fibrillation not on anticoagulation, thrombocytopenia, hypertension, COPD presented to the hospital on 10/17/2020 with acute ischemic stroke with right-sided facial droop and right-sided weakness.  Patient did have expressive aphasia.  Patient was then admitted hospital for management of acute stroke.  Has been seen by rehabilitation team who recommended CIR.  Assessment & Plan   Acute ischemic CVA CT scan showing left MCA infarct with left M1 occlusion on the CT angiogram.  2D echocardiogram with EF of 20 to 25%.  Hemoglobin A1c of 5.3.  Continue aspirin and statin.  PT and OT recommends CIR.  Speech therapy has seen the patient and recommended dysphagia 1 diet with nectar thick liquids.  Eliquis has been restarted at this time after discussing with neurology.  Cervical ligamentous injury -MRI showed some prevertebral fluid.  Neurosurgery was consulted.  It was okay to remove the cervical collar.   Essential hypertension Coreg has been initiated.  We will closely monitor blood pressure.  COPD Currently compensated.  Continue albuterol.  Chronic systolic heart failure 2D echocardiogram on 10/19/2020 showed an EF of less than 20% patient is currently euvolemic and compensated.  Currently lisinopril, Coreg and torsemide on hold.  Paroxysmal atrial fibrillation with RVR  CHA2DS2-VASc of 6.  Patient was not on anticoagulation due to history of thrombocytopenia.  Has been started on Eliquis since yesterday.  Watch closely for bleeding.  Monitor platelets. Will start on coreg.  Thrombocytopenia Chronic.  Latest platelet count of 112.  Monitor for bleeding.  Chronic kidney disease, stage IIIa Continue  to monitor creatinine.  Monitor BMP closely.  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  No acute intervention needed at this time.  Deconditioning,debility status post stroke.  Physical therapy has seen and recommended CIR.  CIR team recommends Ritalin for alertness.   DVT Prophylaxis Lovenox subcu  Code Status: Full  Family Communication:   Disposition Plan:  Status is: Inpatient  Remains inpatient appropriate because:IV treatments appropriate due to intensity of illness or inability to take PO and Inpatient level of care appropriate due to severity of illness   Dispo: The patient is from: Home              Anticipated d/c is to: CIR              Patient currently is not medically stable to d/c.   Difficult to place patient No   Consultants Neurology  Inpatient rehab  Procedures  2 D Echocardiogram  Antibiotics   Anti-infectives (From admission, onward)   None      Subjective:   Today,patient was seen and examined at bedside. Patient is non verbal. Had irregular fast heart rate, and had to receive metoprolol  Objective:   Vitals:   10/23/20 0419 10/23/20 0500 10/23/20 0805 10/23/20 1108  BP: (!) 148/98  (!) 103/52 105/77  Pulse: (!) 105  100 (!) 108  Resp: 20     Temp: 98.7 F (37.1 C)  97.6 F (36.4 C) 98.4 F (36.9 C)  TempSrc: Axillary  Oral Oral  SpO2: 100%  99% 100%  Weight:  88.3 kg     No intake  or output data in the 24 hours ending 10/23/20 1221 Filed Weights   10/21/20 0353 10/22/20 0500 10/23/20 0500  Weight: 83.6 kg 86.8 kg 88.3 kg    Physical exam General: Obese built, not in obvious distress, mildly somnolent, aphasic, ill-appearing, HENT:   No scleral pallor or icterus noted. Oral mucosa is moist.  Chest:   Diminished breath sounds bilaterally.  CVS: S1 &S2 heard.  Systolic murmur noted, irregular rhythm with tachycardia. Abdomen: Soft, nontender,  nondistended.  bowel sounds are heard.  Foley catheter in place  Extremities: No cyanosis, clubbing or edema.  Peripheral pulses are palpable. Psych: Somnolent, nonverbal  CNS: Somnolent, right-sided facial weakness, right-sided weakness.  Left gaze preference. Skin: Warm and dry.  No rashes noted.  Data Reviewed: I have personally reviewed the following lab and imaging studies   CBC: Recent Labs  Lab 10/17/20 2322 10/18/20 0554 10/19/20 0358 10/20/20 0138 10/21/20 0300 10/22/20 0311  WBC 7.3 7.1 6.1 7.0 6.1 6.2  NEUTROABS 4.9  --   --   --   --   --   HGB 13.4 12.8 11.3* 11.1* 10.4* 11.4*  HCT 40.5 38.7 34.7* 33.8* 32.5* 35.0*  MCV 99.0 98.7 100.9* 101.8* 100.9* 100.6*  PLT 144* 130* 105* 98* 101* 782*   Basic Metabolic Panel: Recent Labs  Lab 10/17/20 2322 10/18/20 0554 10/19/20 0358 10/22/20 0311  NA 135 138 139 140  K 4.4 4.3 4.0 4.1  CL 104 108 113* 111  CO2 23 23 18* 19*  GLUCOSE 103* 89 92 104*  BUN 28* 25* 20 17  CREATININE 1.21* 1.10* 0.96 0.80  CALCIUM 9.2 8.9 8.5* 9.0  MG  --  2.0  --   --    GFR: CrCl cannot be calculated (Unknown ideal weight.). Liver Function Tests: Recent Labs  Lab 10/17/20 2322  AST 17  ALT 10  ALKPHOS 51  BILITOT 1.3*  PROT 6.7  ALBUMIN 3.8   No results for input(s): LIPASE, AMYLASE in the last 168 hours. No results for input(s): AMMONIA in the last 168 hours. Coagulation Profile: Recent Labs  Lab 10/17/20 2322  INR 1.2   Cardiac Enzymes: No results for input(s): CKTOTAL, CKMB, CKMBINDEX, TROPONINI in the last 168 hours. BNP (last 3 results) No results for input(s): PROBNP in the last 8760 hours. HbA1C: No results for input(s): HGBA1C in the last 72 hours. CBG: No results for input(s): GLUCAP in the last 168 hours. Lipid Profile: No results for input(s): CHOL, HDL, LDLCALC, TRIG, CHOLHDL, LDLDIRECT in the last 72 hours. Thyroid Function Tests: No results for input(s): TSH, T4TOTAL, FREET4, T3FREE, THYROIDAB in  the last 72 hours. Anemia Panel: No results for input(s): VITAMINB12, FOLATE, FERRITIN, TIBC, IRON, RETICCTPCT in the last 72 hours. Urine analysis:    Component Value Date/Time   COLORURINE YELLOW 10/17/2020 2324   APPEARANCEUR CLEAR 10/17/2020 2324   APPEARANCEUR Clear 10/11/2013 1747   LABSPEC 1.015 10/17/2020 2324   LABSPEC 1.008 10/11/2013 1747   PHURINE 5.0 10/17/2020 2324   GLUCOSEU NEGATIVE 10/17/2020 2324   GLUCOSEU Negative 10/11/2013 1747   HGBUR NEGATIVE 10/17/2020 2324   BILIRUBINUR NEGATIVE 10/17/2020 2324   BILIRUBINUR Negative 10/11/2013 1747   KETONESUR NEGATIVE 10/17/2020 2324   PROTEINUR NEGATIVE 10/17/2020 2324   NITRITE NEGATIVE 10/17/2020 2324   LEUKOCYTESUR NEGATIVE 10/17/2020 2324   LEUKOCYTESUR 2+ 10/11/2013 1747   Sepsis Labs: @LABRCNTIP (procalcitonin:4,lacticidven:4)  ) Recent Results (from the past 240 hour(s))  Resp Panel by RT-PCR (Flu A&B, Covid) Urine, Catheterized  Status: None   Collection Time: 10/17/20 11:24 PM   Specimen: Urine, Catheterized; Nasopharyngeal(NP) swabs in vial transport medium  Result Value Ref Range Status   SARS Coronavirus 2 by RT PCR NEGATIVE NEGATIVE Final    Comment: (NOTE) SARS-CoV-2 target nucleic acids are NOT DETECTED.  The SARS-CoV-2 RNA is generally detectable in upper respiratory specimens during the acute phase of infection. The lowest concentration of SARS-CoV-2 viral copies this assay can detect is 138 copies/mL. A negative result does not preclude SARS-Cov-2 infection and should not be used as the sole basis for treatment or other patient management decisions. A negative result may occur with  improper specimen collection/handling, submission of specimen other than nasopharyngeal swab, presence of viral mutation(s) within the areas targeted by this assay, and inadequate number of viral copies(<138 copies/mL). A negative result must be combined with clinical observations, patient history, and  epidemiological information. The expected result is Negative.  Fact Sheet for Patients:  EntrepreneurPulse.com.au  Fact Sheet for Healthcare Providers:  IncredibleEmployment.be  This test is no t yet approved or cleared by the [place] FDA and  has been authorized for detection and/or diagnosis of SARS-CoV-2 by FDA under an Emergency Use Authorization (EUA). This EUA will remain  in effect (meaning this test can be used) for the duration of the COVID-19 declaration under Section 564(b)(1) of the Act, 21 U.S.C.section 360bbb-3(b)(1), unless the authorization is terminated  or revoked sooner.       Influenza A by PCR NEGATIVE NEGATIVE Final   Influenza B by PCR NEGATIVE NEGATIVE Final    Comment: (NOTE) The Xpert Xpress SARS-CoV-2/FLU/RSV plus assay is intended as an aid in the diagnosis of influenza from Nasopharyngeal swab specimens and should not be used as a sole basis for treatment. Nasal washings and aspirates are unacceptable for Xpert Xpress SARS-CoV-2/FLU/RSV testing.  Fact Sheet for Patients: EntrepreneurPulse.com.au  Fact Sheet for Healthcare Providers: IncredibleEmployment.be  This test is not yet approved or cleared by the [place] FDA and has been authorized for detection and/or diagnosis of SARS-CoV-2 by FDA under an Emergency Use Authorization (EUA). This EUA will remain in effect (meaning this test can be used) for the duration of the COVID-19 declaration under Section 564(b)(1) of the Act, 21 U.S.C. section 360bbb-3(b)(1), unless the authorization is terminated or revoked.  Performed at [facility], [place] [address] [place], [place] [address]   Urine culture     Status: None   Collection Time: 10/17/20 11:24 PM   Specimen: Urine, Catheterized  Result Value Ref Range Status   Specimen Description URINE, CATHETERIZED  Final   Special Requests NONE  Final   Culture    Final    NO GROWTH Performed at [facility], [address] [place],  [address]    Report Status 10/19/2020 FINAL  Final      Radiology Studies: No results found.   Scheduled Meds: .  stroke: mapping our early stages of recovery book   Does not apply Once  . apixaban  5 mg Oral BID  . atorvastatin  40 mg Oral Daily  . carvedilol  12.5 mg Oral BID WC  . Chlorhexidine Gluconate Cloth  6 each Topical Daily  . mouth rinse  15 mL Mouth Rinse BID  . methylphenidate  5 mg Oral BID WC   Continuous Infusions: . sodium chloride 75 mL/hr at 10/23/20 0930     LOS: 5 days    Grayton Lobo MD  10/23/2020 at 12:21  PM [facility] Office  [phone]

## 2020-10-23 NOTE — Progress Notes (Addendum)
#   2 Overnight note  Patient again sustaining HR of 120-160, highest HR reaching 180s. Patient experiencing agitation and seemed frustrated with staff obtaining her vital signs since she kept gesturing to her blood pressure cuff and mumbling with a furrowed look. NT explained that she did take out her purewick again and placed a new one, to which the patient was agitated about previously.   Patient was given PRN ativan 0.5mg  PO with no change to mood or HR.   Shalhoub MD was paged and advised a one-time dose combination of 2.5mg  metoprolol and another 0.5mg  of ativan.   As of 0654, patient HR is sustaining 100-120bpm.

## 2020-10-23 NOTE — Progress Notes (Signed)
Inpatient Rehabilitation Admissions Coordinator  I have received insurance approval for CIR admit. I await medical readiness and bed availability to admit. I spoke with her sister, [name], by phone and she is aware.  Danne Baxter, RN, MSN Rehab Admissions Coordinator [phone] 10/23/2020 12:18 PM

## 2020-10-24 LAB — CBC
HCT: 35.2 % — ABNORMAL LOW (ref 36.0–46.0)
Hemoglobin: 11.2 g/dL — ABNORMAL LOW (ref 12.0–15.0)
MCH: 32.2 pg (ref 26.0–34.0)
MCHC: 31.8 g/dL (ref 30.0–36.0)
MCV: 101.1 fL — ABNORMAL HIGH (ref 80.0–100.0)
Platelets: 121 10*3/uL — ABNORMAL LOW (ref 150–400)
RBC: 3.48 MIL/uL — ABNORMAL LOW (ref 3.87–5.11)
RDW: 13.6 % (ref 11.5–15.5)
WBC: 6.7 10*3/uL (ref 4.0–10.5)
nRBC: 0.4 % — ABNORMAL HIGH (ref 0.0–0.2)

## 2020-10-24 LAB — BASIC METABOLIC PANEL
Anion gap: 6 (ref 5–15)
BUN: 28 mg/dL — ABNORMAL HIGH (ref 8–23)
CO2: 17 mmol/L — ABNORMAL LOW (ref 22–32)
Calcium: 8.6 mg/dL — ABNORMAL LOW (ref 8.9–10.3)
Chloride: 116 mmol/L — ABNORMAL HIGH (ref 98–111)
Creatinine, Ser: 1 mg/dL (ref 0.44–1.00)
GFR, Estimated: >60 mL/min (ref >60)
Glucose, Bld: 109 mg/dL — ABNORMAL HIGH (ref 70–99)
Potassium: 4.9 mmol/L (ref 3.5–5.1)
Sodium: 139 mmol/L (ref 135–145)

## 2020-10-24 LAB — PHOSPHORUS: Phosphorus: 4.2 mg/dL (ref 2.5–4.6)

## 2020-10-24 LAB — MAGNESIUM: Magnesium: 2.2 mg/dL (ref 1.7–2.4)

## 2020-10-24 NOTE — Progress Notes (Signed)
Inpatient Rehabilitation Admissions Coordinator  I await further progress and ability to participate with therapies before pursuing admit to CIR. I will follow up on Monday.  Danne Baxter, RN, MSN Rehab Admissions Coordinator [phone] 10/24/2020 1:38 PM

## 2020-10-24 NOTE — Progress Notes (Signed)
PROGRESS NOTE    Angelica Strickland  JOA:[id number] DOB: 02-17-1956 DOA: 10/17/2020 PCP: Birdie Sons, MD   Brief Narrative:   Angelica Strickland is a 65 y.o. female with past medical history of chronic systolic heart failure with latest left ventricular ejection fraction of 20%, paroxysmal atrial fibrillation not on anticoagulation, thrombocytopenia, hypertension, COPD presented to the hospital on 10/17/2020 with acute ischemic stroke with right-sided facial droop and right-sided weakness.  Patient did have expressive aphasia.  Patient was then admitted hospital for management of acute stroke.  Has been seen by rehabilitation team who recommended CIR.  Assessment & Plan   Acute ischemic CVA CT scan showing left MCA infarct with left M1 occlusion on the CT angiogram.  2D echocardiogram with EF of 20 to 25%.  Hemoglobin A1c of 5.3.  Continue aspirin and statin.  PT and OT recommends CIR.  Speech therapy has seen the patient and recommended dysphagia 1 diet with nectar thick liquids.  Eliquis has been restarted at this time after discussing with neurology.  Cervical ligamentous injury -MRI showed some prevertebral fluid.  Neurosurgery was consulted.  No surgical intervention needed.  Essential hypertension Coreg has been initiated.  We will closely monitor blood pressure.  COPD Currently compensated.  Continue albuterol.  Chronic systolic heart failure 2D echocardiogram on 10/19/2020 showed an EF of less than 20%, patient is currently euvolemic and compensated.  Currently lisinopril, and torsemide on hold.  Has been started on Coreg.  Paroxysmal atrial fibrillation with RVR  Improved.  CHA2DS2-VASc of 6.  Has been started on Eliquis.  Coreg has been reinitiated.  Thrombocytopenia Chronic.  Latest platelet count of 112.  Monitor for bleeding.  Chronic kidney disease, stage IIIa Continue to monitor creatinine.  Monitor BMP closely.  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  No acute intervention needed at this time.  Deconditioning,debility status post stroke.  Physical therapy has seen and recommended CIR.  CIR team recommends Ritalin for alertness.  Continue Ritalin.    DVT Prophylaxis Lovenox subcu  Code Status: Full code  Family Communication:   Disposition Plan:  Status is: Inpatient  Remains inpatient appropriate because:IV treatments appropriate due to intensity of illness or inability to take PO and Inpatient level of care appropriate due to severity of illness   Dispo: The patient is from: Home              Anticipated d/c is to: CIR              Patient currently is not medically stable to d/c.   Difficult to place patient No   Consultants Neurology  Inpatient rehab  Procedures  2 D Echocardiogram  Antibiotics   Anti-infectives (From admission, onward)   None      Subjective:   Today, patient is seen and examined at bedside.  Patient is largely nonverbal.  Objective:   Vitals:   10/23/20 2347 10/24/20 0420 10/24/20 0809 10/24/20 1148  BP: 107/71 102/82 97/63 91/75   Pulse: 85 (!) 102 (!) 102 87  Resp: 18 19 20 18   Temp: (!) 97.5 F (36.4 C) 97.8 F (36.6 C) (!) 97.4 F (36.3 C) 97.8 F (36.6 C)  TempSrc: Oral Axillary Axillary Oral  SpO2: 100% 99% 93% 98%  Weight:        Intake/Output Summary (Last 24 hours) at 10/24/2020 1425 Last data filed at 10/24/2020 1228 Gross per 24 hour  Intake  4157.89 ml  Output --  Net 4157.89 ml   Filed Weights   10/21/20 0353 10/22/20 0500 10/23/20 0500  Weight: 83.6 kg 86.8 kg 88.3 kg    Physical exam General: Obese built, not in obvious distress, more alert awake today, aphasic HENT:   No scleral pallor or icterus noted. Oral mucosa is moist.  Chest:    Diminished breath sounds bilaterally. No crackles or wheezes.  CVS: S1 &S2 heard.  Systolic murmur noted.  Irregular rhythm with controlled rate Abdomen: Soft,  nontender, nondistended.  Bowel sounds are heard.  Foley catheter in place. Extremities: No cyanosis, clubbing or edema.  Peripheral pulses are palpable. Psych: Alert, awake but nonverbal, CNS: Right facial weakness, right-sided weakness, left gaze preference, Skin: Warm and dry.  No rashes noted.  Data Reviewed: I have personally reviewed the following lab and imaging studies   CBC: Recent Labs  Lab 10/17/20 2322 10/18/20 0554 10/19/20 0358 10/20/20 0138 10/21/20 0300 10/22/20 0311 10/24/20 0123  WBC 7.3   < > 6.1 7.0 6.1 6.2 6.7  NEUTROABS 4.9  --   --   --   --   --   --   HGB 13.4   < > 11.3* 11.1* 10.4* 11.4* 11.2*  HCT 40.5   < > 34.7* 33.8* 32.5* 35.0* 35.2*  MCV 99.0   < > 100.9* 101.8* 100.9* 100.6* 101.1*  PLT 144*   < > 105* 98* 101* 112* 121*   < > = values in this interval not displayed.   Basic Metabolic Panel: Recent Labs  Lab 10/17/20 2322 10/18/20 0554 10/19/20 0358 10/22/20 0311 10/24/20 0123  NA 135 138 139 140 139  K 4.4 4.3 4.0 4.1 4.9  CL 104 108 113* 111 116*  CO2 23 23 18* 19* 17*  GLUCOSE 103* 89 92 104* 109*  BUN 28* 25* 20 17 28*  CREATININE 1.21* 1.10* 0.96 0.80 1.00  CALCIUM 9.2 8.9 8.5* 9.0 8.6*  MG  --  2.0  --   --  2.2  PHOS  --   --   --   --  4.2   GFR: CrCl cannot be calculated (Unknown ideal weight.). Liver Function Tests: Recent Labs  Lab 10/17/20 2322  AST 17  ALT 10  ALKPHOS 51  BILITOT 1.3*  PROT 6.7  ALBUMIN 3.8   No results for input(s): LIPASE, AMYLASE in the last 168 hours. No results for input(s): AMMONIA in the last 168 hours. Coagulation Profile: Recent Labs  Lab 10/17/20 2322  INR 1.2   Cardiac Enzymes: No results for input(s): CKTOTAL, CKMB, CKMBINDEX, TROPONINI in the last 168 hours. BNP (last 3 results) No results for input(s): PROBNP in the last 8760 hours. HbA1C: No results for input(s): HGBA1C in the last 72 hours. CBG: No results for input(s): GLUCAP in the last 168 hours. Lipid  Profile: No results for input(s): CHOL, HDL, LDLCALC, TRIG, CHOLHDL, LDLDIRECT in the last 72 hours. Thyroid Function Tests: No results for input(s): TSH, T4TOTAL, FREET4, T3FREE, THYROIDAB in the last 72 hours. Anemia Panel: No results for input(s): VITAMINB12, FOLATE, FERRITIN, TIBC, IRON, RETICCTPCT in the last 72 hours. Urine analysis:    Component Value Date/Time   COLORURINE YELLOW 10/17/2020 2324   APPEARANCEUR CLEAR 10/17/2020 2324   APPEARANCEUR Clear 10/11/2013 1747   LABSPEC 1.015 10/17/2020 2324   LABSPEC 1.008 10/11/2013 1747   PHURINE 5.0 10/17/2020 2324   GLUCOSEU NEGATIVE 10/17/2020 2324   GLUCOSEU Negative 10/11/2013 1747   HGBUR NEGATIVE  10/17/2020 2324   BILIRUBINUR NEGATIVE 10/17/2020 2324   BILIRUBINUR Negative 10/11/2013 [place] 10/17/2020 2324   PROTEINUR NEGATIVE 10/17/2020 2324   NITRITE NEGATIVE 10/17/2020 2324   LEUKOCYTESUR NEGATIVE 10/17/2020 2324   LEUKOCYTESUR 2+ 10/11/2013 1747   Sepsis Labs: @LABRCNTIP (procalcitonin:4,lacticidven:4)  ) Recent Results (from the past 240 hour(s))  Resp Panel by RT-PCR (Flu A&B, Covid) Urine, Catheterized     Status: None   Collection Time: 10/17/20 11:24 PM   Specimen: Urine, Catheterized; Nasopharyngeal(NP) swabs in vial transport medium  Result Value Ref Range Status   SARS Coronavirus 2 by RT PCR NEGATIVE NEGATIVE Final    Comment: (NOTE) SARS-CoV-2 target nucleic acids are NOT DETECTED.  The SARS-CoV-2 RNA is generally detectable in upper respiratory specimens during the acute phase of infection. The lowest concentration of SARS-CoV-2 viral copies this assay can detect is 138 copies/mL. A negative result does not preclude SARS-Cov-2 infection and should not be used as the sole basis for treatment or other patient management decisions. A negative result may occur with  improper specimen collection/handling, submission of specimen other than nasopharyngeal swab, presence of viral  mutation(s) within the areas targeted by this assay, and inadequate number of viral copies(<138 copies/mL). A negative result must be combined with clinical observations, patient history, and epidemiological information. The expected result is Negative.  Fact Sheet for Patients:  EntrepreneurPulse.com.au  Fact Sheet for Healthcare Providers:  IncredibleEmployment.be  This test is no t yet approved or cleared by the [place] FDA and  has been authorized for detection and/or diagnosis of SARS-CoV-2 by FDA under an Emergency Use Authorization (EUA). This EUA will remain  in effect (meaning this test can be used) for the duration of the COVID-19 declaration under Section 564(b)(1) of the Act, 21 U.S.C.section 360bbb-3(b)(1), unless the authorization is terminated  or revoked sooner.       Influenza A by PCR NEGATIVE NEGATIVE Final   Influenza B by PCR NEGATIVE NEGATIVE Final    Comment: (NOTE) The Xpert Xpress SARS-CoV-2/FLU/RSV plus assay is intended as an aid in the diagnosis of influenza from Nasopharyngeal swab specimens and should not be used as a sole basis for treatment. Nasal washings and aspirates are unacceptable for Xpert Xpress SARS-CoV-2/FLU/RSV testing.  Fact Sheet for Patients: EntrepreneurPulse.com.au  Fact Sheet for Healthcare Providers: IncredibleEmployment.be  This test is not yet approved or cleared by the [place] FDA and has been authorized for detection and/or diagnosis of SARS-CoV-2 by FDA under an Emergency Use Authorization (EUA). This EUA will remain in effect (meaning this test can be used) for the duration of the COVID-19 declaration under Section 564(b)(1) of the Act, 21 U.S.C. section 360bbb-3(b)(1), unless the authorization is terminated or revoked.  Performed at [facility], [place] [address], [place], [place] [address]   Urine culture     Status: None    Collection Time: 10/17/20 11:24 PM   Specimen: Urine, Catheterized  Result Value Ref Range Status   Specimen Description URINE, CATHETERIZED  Final   Special Requests NONE  Final   Culture   Final    NO GROWTH Performed at [facility], [address] [place], [place] [address]    Report Status 10/19/2020 FINAL  Final      Radiology Studies: No results found.   Scheduled Meds: .  stroke: mapping our early stages of recovery book   Does not apply Once  . apixaban  5 mg Oral BID  . atorvastatin  40 mg Oral Daily  . carvedilol  12.5 mg Oral BID WC  . Chlorhexidine Gluconate Cloth  6 each Topical Daily  . mouth rinse  15 mL Mouth Rinse BID  . methylphenidate  5 mg Oral BID WC   Continuous Infusions: . sodium chloride 75 mL/hr at 10/24/20 1332     LOS: 6 days    Flora Lipps MD  10/24/2020 at 2:25 PM [facility] Office  [phone]

## 2020-10-24 NOTE — Progress Notes (Signed)
  Speech Language Pathology Treatment: Cognitive-Linquistic;Dysphagia  Patient Details Name: Angelica Strickland MRN: [id number] DOB: 03/24/1956 Today's Date: 10/24/2020 Time: 5102-5852 SLP Time Calculation (min) (ACUTE ONLY): 11 min  Assessment / Plan / Recommendation Clinical Impression  Pt was seen for limited session to address aphasia/dysphagia - she was difficult to rouse and to sustain participation.  Pt consumed sips of water with hand-over-hand assist to hold cup with LUE, her unaffected side. There were no overt s/s of aspiration.  Did not offer advanced solids as planned due to lethargy.  Despite max verbal/visual/tactile cues, no functional speech could be elicited today. Pt vocalized in response to questions - no real speech was formulated.  Ceased session due to her fatigue; repositioned for comfort and left pt with bed alarm activated. Discussed status with RN, who stated that her speech has been inconsistent all day.    HPI HPI: Pt is a 65 yo adm to [facility] after recent fall, new onset of right side weakness, facial asymmetery and aphasia.  Imaging of brain showed moderately large acute to subacute CVA due to Left M1 occlusion.  Pt also with remote bilateral cerebellar cvas.  Cervical spine imaging showed osteophytic changes C5-C6, C6-C7.  PMH + for pituitary tumor, COPD, CHF, smoking. Pt failed [facility] swallow screen.      SLP Plan  Continue with current plan of care       Recommendations  Diet recommendations: Dysphagia 1 (puree);Thin liquid Liquids provided via: Cup;No straw Medication Administration: Crushed with puree Supervision: Full supervision/cueing for compensatory strategies;Staff to assist with self feeding Compensations: Minimize environmental distractions;Slow rate;Small sips/bites;Follow solids with liquid Postural Changes and/or Swallow Maneuvers: Seated upright 90 degrees                Oral Care Recommendations: Oral care BID Follow up Recommendations: Inpatient  Rehab SLP Visit Diagnosis: Dysphagia, oropharyngeal phase (R13.12);Aphasia (R47.01) Plan: Continue with current plan of care       [place]. Tivis Ringer, [place] Office number [phone] Pager [phone]  Juan Quam Laurice 10/24/2020, 3:52 PM

## 2020-10-25 NOTE — Progress Notes (Signed)
PROGRESS NOTE    Angelica Strickland  EFE:[id number] DOB: Apr 18, 1956 DOA: 10/17/2020 PCP: Birdie Sons, MD   Brief Narrative:   Angelica Strickland is a 65 y.o. female with past medical history of chronic systolic heart failure with latest left ventricular ejection fraction of 20%, paroxysmal atrial fibrillation not on anticoagulation, thrombocytopenia, hypertension, COPD presented to the hospital on 10/17/2020 with acute ischemic stroke with right-sided facial droop and right-sided weakness.  Patient did have expressive aphasia.  Patient was then admitted hospital for management of acute stroke.  Has been seen by rehabilitation team who recommended CIR.  Assessment & Plan   Acute ischemic CVA CT scan showing left MCA infarct with left M1 occlusion on the CT angiogram.  2D echocardiogram with EF of 20 to 25%.  Hemoglobin A1c of 5.3.  Continue aspirin and statin.  PT and OT recommends CIR.  Speech therapy has seen the patient and recommended dysphagia 1 diet with nectar thick liquids.  Currently on Eliquis.  Cervical ligamentous injury -MRI showed some prevertebral fluid.  Neurosurgery was consulted.  Little intervention or cervical collar.  Essential hypertension Continue Coreg.  Blood pressure seems to be stable  COPD Currently compensated.  Continue albuterol.  Chronic systolic heart failure 2D echocardiogram on 10/19/2020 showed an EF of less than 20%, patient is currently euvolemic and compensated.  Currently lisinopril, and torsemide on hold.  Has been started on Coreg.  Paroxysmal atrial fibrillation with RVR  Improved.  CHA2DS2-VASc of 6.  Has been started on Eliquis.  Coreg has been reinitiated.  Thrombocytopenia Chronic.  Latest platelet count of 112.  Monitor for bleeding.  Chronic kidney disease, stage IIIa Continue to monitor creatinine.  Monitor BMP closely.  Latest creatinine of 0.8  Probable meningioma -Noted on CTA head and neck, probable small planum sphenoidal  meningioma.  No acute intervention needed at this time.  Deconditioning,debility status post stroke.  Physical therapy has seen and recommended CIR.  CIR team recommends Ritalin for alertness.  Continue Ritalin.   DVT Prophylaxis Lovenox subcu  Code Status: Full code  Family Communication:   Disposition Plan:  Status is: Inpatient  Remains inpatient appropriate because:IV treatments appropriate due to intensity of illness or inability to take PO and Inpatient level of care appropriate due to severity of illness  Dispo: The patient is from: Home              Anticipated d/c is to: CIR              Patient currently is not medically stable to d/c.   Difficult to place patient No   Consultants Neurology  Inpatient rehab  Procedures  2 D Echocardiogram  Antibiotics   Anti-infectives (From admission, onward)   None      Subjective:   Today, patient was seen and examined at bedside.  Appears to be little more alert and responding some.  Objective:   Vitals:   10/25/20 0236 10/25/20 0425 10/25/20 0500 10/25/20 0845  BP:  109/76  (!) 116/98  Pulse: 100 (!) 128  (!) 106  Resp: (!) 22 18  18   Temp:  97.7 F (36.5 C)  97.6 F (36.4 C)  TempSrc:  Oral  Axillary  SpO2: 97% 100%  96%  Weight:   95.4 kg     Intake/Output Summary (Last 24 hours) at 10/25/2020 0848 Last data filed at 10/24/2020 2356 Gross per 24 hour  Intake 1290.56 ml  Output --  Net 1290.56 ml  Filed Weights   10/22/20 0500 10/23/20 0500 10/25/20 0500  Weight: 86.8 kg 88.3 kg 95.4 kg    Physical exam  General: Obese built, not in obvious distress, more alert awake today, utterrring "huh" HENT:   No scleral pallor or icterus noted. Oral mucosa is moist.  Chest:    Diminished breath sounds bilaterally. No crackles or wheezes.  CVS: S1 &S2 heard.  Systolic murmur noted.  Irregular rhythm with controlled rate Abdomen: Soft, nontender, nondistended.  Bowel sounds are heard.  Foley catheter in  place. Extremities: No cyanosis, clubbing or edema.  Peripheral pulses are palpable. Psych: Alert, awake but nonverbal, CNS: Right facial weakness, right-sided weakness, left gaze preference, Skin: Warm and dry.  No rashes noted.  Data Reviewed: I have personally reviewed the following lab and imaging studies   CBC: Recent Labs  Lab 10/19/20 0358 10/20/20 0138 10/21/20 0300 10/22/20 0311 10/24/20 0123  WBC 6.1 7.0 6.1 6.2 6.7  HGB 11.3* 11.1* 10.4* 11.4* 11.2*  HCT 34.7* 33.8* 32.5* 35.0* 35.2*  MCV 100.9* 101.8* 100.9* 100.6* 101.1*  PLT 105* 98* 101* 112* 144*   Basic Metabolic Panel: Recent Labs  Lab 10/19/20 0358 10/22/20 0311 10/24/20 0123  NA 139 140 139  K 4.0 4.1 4.9  CL 113* 111 116*  CO2 18* 19* 17*  GLUCOSE 92 104* 109*  BUN 20 17 28*  CREATININE 0.96 0.80 1.00  CALCIUM 8.5* 9.0 8.6*  MG  --   --  2.2  PHOS  --   --  4.2   GFR: CrCl cannot be calculated (Unknown ideal weight.). Liver Function Tests: No results for input(s): AST, ALT, ALKPHOS, BILITOT, PROT, ALBUMIN in the last 168 hours. No results for input(s): LIPASE, AMYLASE in the last 168 hours. No results for input(s): AMMONIA in the last 168 hours. Coagulation Profile: No results for input(s): INR, PROTIME in the last 168 hours. Cardiac Enzymes: No results for input(s): CKTOTAL, CKMB, CKMBINDEX, TROPONINI in the last 168 hours. BNP (last 3 results) No results for input(s): PROBNP in the last 8760 hours. HbA1C: No results for input(s): HGBA1C in the last 72 hours. CBG: No results for input(s): GLUCAP in the last 168 hours. Lipid Profile: No results for input(s): CHOL, HDL, LDLCALC, TRIG, CHOLHDL, LDLDIRECT in the last 72 hours. Thyroid Function Tests: No results for input(s): TSH, T4TOTAL, FREET4, T3FREE, THYROIDAB in the last 72 hours. Anemia Panel: No results for input(s): VITAMINB12, FOLATE, FERRITIN, TIBC, IRON, RETICCTPCT in the last 72 hours. Urine analysis:    Component Value  Date/Time   COLORURINE YELLOW 10/17/2020 2324   APPEARANCEUR CLEAR 10/17/2020 2324   APPEARANCEUR Clear 10/11/2013 1747   LABSPEC 1.015 10/17/2020 2324   LABSPEC 1.008 10/11/2013 1747   PHURINE 5.0 10/17/2020 2324   GLUCOSEU NEGATIVE 10/17/2020 2324   GLUCOSEU Negative 10/11/2013 1747   HGBUR NEGATIVE 10/17/2020 2324   BILIRUBINUR NEGATIVE 10/17/2020 2324   BILIRUBINUR Negative 10/11/2013 1747   KETONESUR NEGATIVE 10/17/2020 2324   PROTEINUR NEGATIVE 10/17/2020 2324   NITRITE NEGATIVE 10/17/2020 2324   LEUKOCYTESUR NEGATIVE 10/17/2020 2324   LEUKOCYTESUR 2+ 10/11/2013 1747   Sepsis Labs: @LABRCNTIP (procalcitonin:4,lacticidven:4)  ) Recent Results (from the past 240 hour(s))  Resp Panel by RT-PCR (Flu A&B, Covid) Urine, Catheterized     Status: None   Collection Time: 10/17/20 11:24 PM   Specimen: Urine, Catheterized; Nasopharyngeal(NP) swabs in vial transport medium  Result Value Ref Range Status   SARS Coronavirus 2 by RT PCR NEGATIVE NEGATIVE Final  Comment: (NOTE) SARS-CoV-2 target nucleic acids are NOT DETECTED.  The SARS-CoV-2 RNA is generally detectable in upper respiratory specimens during the acute phase of infection. The lowest concentration of SARS-CoV-2 viral copies this assay can detect is 138 copies/mL. A negative result does not preclude SARS-Cov-2 infection and should not be used as the sole basis for treatment or other patient management decisions. A negative result may occur with  improper specimen collection/handling, submission of specimen other than nasopharyngeal swab, presence of viral mutation(s) within the areas targeted by this assay, and inadequate number of viral copies(<138 copies/mL). A negative result must be combined with clinical observations, patient history, and epidemiological information. The expected result is Negative.  Fact Sheet for Patients:  EntrepreneurPulse.com.au  Fact Sheet for Healthcare Providers:   IncredibleEmployment.be  This test is no t yet approved or cleared by the [place] FDA and  has been authorized for detection and/or diagnosis of SARS-CoV-2 by FDA under an Emergency Use Authorization (EUA). This EUA will remain  in effect (meaning this test can be used) for the duration of the COVID-19 declaration under Section 564(b)(1) of the Act, 21 U.S.C.section 360bbb-3(b)(1), unless the authorization is terminated  or revoked sooner.       Influenza A by PCR NEGATIVE NEGATIVE Final   Influenza B by PCR NEGATIVE NEGATIVE Final    Comment: (NOTE) The Xpert Xpress SARS-CoV-2/FLU/RSV plus assay is intended as an aid in the diagnosis of influenza from Nasopharyngeal swab specimens and should not be used as a sole basis for treatment. Nasal washings and aspirates are unacceptable for Xpert Xpress SARS-CoV-2/FLU/RSV testing.  Fact Sheet for Patients: EntrepreneurPulse.com.au  Fact Sheet for Healthcare Providers: IncredibleEmployment.be  This test is not yet approved or cleared by the [place] FDA and has been authorized for detection and/or diagnosis of SARS-CoV-2 by FDA under an Emergency Use Authorization (EUA). This EUA will remain in effect (meaning this test can be used) for the duration of the COVID-19 declaration under Section 564(b)(1) of the Act, 21 U.S.C. section 360bbb-3(b)(1), unless the authorization is terminated or revoked.  Performed at [facility], [place] [address] [place], [place] [address]   Urine culture     Status: None   Collection Time: 10/17/20 11:24 PM   Specimen: Urine, Catheterized  Result Value Ref Range Status   Specimen Description URINE, CATHETERIZED  Final   Special Requests NONE  Final   Culture   Final    NO GROWTH Performed at [facility], [address] [place],  [address]    Report Status 10/19/2020 FINAL  Final      Radiology Studies: No  results found.   Scheduled Meds: .  stroke: mapping our early stages of recovery book   Does not apply Once  . apixaban  5 mg Oral BID  . atorvastatin  40 mg Oral Daily  . carvedilol  12.5 mg Oral BID WC  . Chlorhexidine Gluconate Cloth  6 each Topical Daily  . mouth rinse  15 mL Mouth Rinse BID  . methylphenidate  5 mg Oral BID WC   Continuous Infusions: . sodium chloride 75 mL/hr at 10/24/20 2356     LOS: 7 days    Flora Lipps MD  10/25/2020 at 8:48 AM [facility] Office  [phone]

## 2020-10-25 NOTE — Plan of Care (Signed)
  Problem: Nutrition: Goal: Risk of aspiration will decrease Outcome: Progressing Goal: Dietary intake will improve Outcome: Progressing   Problem: Nutrition: Goal: Dietary intake will improve Outcome: Progressing

## 2020-10-26 LAB — CBC
HCT: 35.4 % — ABNORMAL LOW (ref 36.0–46.0)
Hemoglobin: 11.6 g/dL — ABNORMAL LOW (ref 12.0–15.0)
MCH: 33.3 pg (ref 26.0–34.0)
MCHC: 32.8 g/dL (ref 30.0–36.0)
MCV: 101.7 fL — ABNORMAL HIGH (ref 80.0–100.0)
Platelets: 151 10*3/uL (ref 150–400)
RBC: 3.48 MIL/uL — ABNORMAL LOW (ref 3.87–5.11)
RDW: 14 % (ref 11.5–15.5)
WBC: 7.5 10*3/uL (ref 4.0–10.5)
nRBC: 4.9 % — ABNORMAL HIGH (ref 0.0–0.2)

## 2020-10-26 LAB — BASIC METABOLIC PANEL
Anion gap: 7 (ref 5–15)
BUN: 38 mg/dL — ABNORMAL HIGH (ref 8–23)
CO2: 15 mmol/L — ABNORMAL LOW (ref 22–32)
Calcium: 8.6 mg/dL — ABNORMAL LOW (ref 8.9–10.3)
Chloride: 113 mmol/L — ABNORMAL HIGH (ref 98–111)
Creatinine, Ser: 0.99 mg/dL (ref 0.44–1.00)
GFR, Estimated: >60 mL/min (ref >60)
Glucose, Bld: 118 mg/dL — ABNORMAL HIGH (ref 70–99)
Potassium: 5.4 mmol/L — ABNORMAL HIGH (ref 3.5–5.1)
Sodium: 135 mmol/L (ref 135–145)

## 2020-10-26 LAB — MAGNESIUM: Magnesium: 2.3 mg/dL (ref 1.7–2.4)

## 2020-10-26 MED ORDER — FUROSEMIDE 10 MG/ML IJ SOLN
INTRAMUSCULAR | Status: AC
  Filled 2020-10-26: qty 4

## 2020-10-26 MED ORDER — FUROSEMIDE 10 MG/ML IJ SOLN
40.0000 mg | Freq: Once | INTRAMUSCULAR | Status: AC
  Administered 2020-10-26: 40 mg via INTRAVENOUS

## 2020-10-26 NOTE — Progress Notes (Signed)
PROGRESS NOTE    Angelica Strickland  URK:[id number] DOB: [birth year] DOA: 10/17/2020 PCP: Birdie Sons, MD   Brief Narrative:   Angelica Strickland is a 65 y.o. female with past medical history of chronic systolic heart failure with latest left ventricular ejection fraction of 20%, paroxysmal atrial fibrillation not on anticoagulation, thrombocytopenia, hypertension, COPD presented to the hospital on 10/17/2020 with acute ischemic stroke with right-sided facial droop and right-sided weakness.  Patient did have expressive aphasia.  Patient was then admitted hospital for management of acute stroke.  Has been seen by rehabilitation team who recommended CIR.  Assessment & Plan   Acute ischemic CVA CT scan showing left MCA infarct with left M1 occlusion on the CT angiogram.  2D echocardiogram with EF of 20 to 25%.  Hemoglobin A1c of 5.3.  Continue aspirin and statin.  PT and OT recommends CIR.  Speech therapy has seen the patient and recommended dysphagia 1 diet with nectar thick liquids.  Currently on Eliquis.  Mildly responsive    Cervical ligamentous injury -MRI showed some prevertebral fluid.  Neurosurgery was consulted.  Little intervention or cervical collar.  Essential hypertension Continue Coreg.  Blood pressure seems to be stable.  Latest blood pressure of 109/81  COPD Currently compensated.  Continue albuterol.  Mild tachypnea but will continue with bronchodilators.  Patient is significantly positive fluid balance.  We will give 1 dose of IV Lasix today.  Still on gentle IV fluids.  Chronic systolic heart failure 2D echocardiogram on 10/19/2020 showed an EF of less than 20%, patient is currently euvolemic and compensated.  Currently lisinopril, and torsemide on hold.  Has been started on Coreg.  Paroxysmal atrial fibrillation with RVR  Improved.  CHA2DS2-VASc of 6.  Has been started on Eliquis.  Coreg has been reinitiated.  Thrombocytopenia Chronic.  Latest platelet count of 112.   Monitor for bleeding.  Chronic kidney disease, stage IIIa Continue to monitor creatinine.  Monitor BMP closely.  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  No acute intervention needed at this time.  Deconditioning,debility status post stroke.  Physical therapy recommended CIR.  On  Ritalin for alertness.  Continue Ritalin.   DVT Prophylaxis Lovenox subcu  Code Status: Full code  Family Communication:   Disposition Plan:  Status is: Inpatient  Remains inpatient appropriate because:IV treatments appropriate due to intensity of illness or inability to take PO and Inpatient level of care appropriate due to severity of illness  Dispo: The patient is from: Home              Anticipated d/c is to: CIR              Patient currently is not medically stable to d/c.   Difficult to place patient No   Consultants Neurology  Inpatient rehab  Procedures  2 D Echocardiogram  Antibiotics   Anti-infectives (From admission, onward)   None      Subjective:   Patient was seen and examined at bedside.  Appears to be mildly sleepy.  Answering a few with "huh' response.  Objective:   Vitals:   10/25/20 1939 10/26/20 0000 10/26/20 0404 10/26/20 0410  BP: 107/71 102/67 109/81   Pulse: (!) 104 (!) 102 (!) 107   Resp: 18 20 18    Temp: (!) 97.4 F (36.3 C) 97.8 F (36.6 C) 98.3 F (36.8 C)   TempSrc: Oral Oral Oral   SpO2: 99% 98%  98%   Weight:    98.1 kg    Intake/Output Summary (Last 24 hours) at 10/26/2020 1321 Last data filed at 10/26/2020 6606 Gross per 24 hour  Intake 1970 ml  Output --  Net 1970 ml   Filed Weights   10/23/20 0500 10/25/20 0500 10/26/20 0410  Weight: 88.3 kg 95.4 kg 98.1 kg    Physical exam  General: Obese built, not in obvious distress, mildly sleepy but responsive, utterrring "huh" HENT:   No scleral pallor or icterus noted. Oral mucosa is moist.  Chest:    Diminished  breath sounds bilaterally. No crackles or wheezes.  CVS: S1 &S2 heard.  Systolic murmur noted.  Irregular rhythm with controlled rate Abdomen: Soft, nontender, nondistended.  Bowel sounds are heard.  Foley catheter in place. Extremities: No cyanosis, clubbing or edema.  Peripheral pulses are palpable. Psych: Mildly sleepy, mildly responsive. CNS: Right facial weakness, right-sided weakness, left gaze preference, Skin: Warm and dry.  No rashes noted.  Data Reviewed: I have personally reviewed the following lab and imaging studies   CBC: Recent Labs  Lab 10/20/20 0138 10/21/20 0300 10/22/20 0311 10/24/20 0123 10/26/20 0149  WBC 7.0 6.1 6.2 6.7 7.5  HGB 11.1* 10.4* 11.4* 11.2* 11.6*  HCT 33.8* 32.5* 35.0* 35.2* 35.4*  MCV 101.8* 100.9* 100.6* 101.1* 101.7*  PLT 98* 101* 112* 121* 301   Basic Metabolic Panel: Recent Labs  Lab 10/22/20 0311 10/24/20 0123 10/26/20 0149  NA 140 139 135  K 4.1 4.9 5.4*  CL 111 116* 113*  CO2 19* 17* 15*  GLUCOSE 104* 109* 118*  BUN 17 28* 38*  CREATININE 0.80 1.00 0.99  CALCIUM 9.0 8.6* 8.6*  MG  --  2.2 2.3  PHOS  --  4.2  --    GFR: CrCl cannot be calculated (Unknown ideal weight.). Liver Function Tests: No results for input(s): AST, ALT, ALKPHOS, BILITOT, PROT, ALBUMIN in the last 168 hours. No results for input(s): LIPASE, AMYLASE in the last 168 hours. No results for input(s): AMMONIA in the last 168 hours. Coagulation Profile: No results for input(s): INR, PROTIME in the last 168 hours. Cardiac Enzymes: No results for input(s): CKTOTAL, CKMB, CKMBINDEX, TROPONINI in the last 168 hours. BNP (last 3 results) No results for input(s): PROBNP in the last 8760 hours. HbA1C: No results for input(s): HGBA1C in the last 72 hours. CBG: No results for input(s): GLUCAP in the last 168 hours. Lipid Profile: No results for input(s): CHOL, HDL, LDLCALC, TRIG, CHOLHDL, LDLDIRECT in the last 72 hours. Thyroid Function Tests: No results for  input(s): TSH, T4TOTAL, FREET4, T3FREE, THYROIDAB in the last 72 hours. Anemia Panel: No results for input(s): VITAMINB12, FOLATE, FERRITIN, TIBC, IRON, RETICCTPCT in the last 72 hours. Urine analysis:    Component Value Date/Time   COLORURINE YELLOW 10/17/2020 2324   APPEARANCEUR CLEAR 10/17/2020 2324   APPEARANCEUR Clear 10/11/2013 1747   LABSPEC 1.015 10/17/2020 2324   LABSPEC 1.008 10/11/2013 1747   PHURINE 5.0 10/17/2020 2324   GLUCOSEU NEGATIVE 10/17/2020 2324   GLUCOSEU Negative 10/11/2013 1747   HGBUR NEGATIVE 10/17/2020 2324   BILIRUBINUR NEGATIVE 10/17/2020 2324   BILIRUBINUR Negative 10/11/2013 1747   KETONESUR NEGATIVE 10/17/2020 2324   PROTEINUR NEGATIVE 10/17/2020 2324   NITRITE NEGATIVE 10/17/2020 2324   LEUKOCYTESUR NEGATIVE 10/17/2020 2324   LEUKOCYTESUR 2+ 10/11/2013 1747   Sepsis Labs: @LABRCNTIP (procalcitonin:4,lacticidven:4)  ) Recent Results (from the past 240 hour(s))  Resp Panel by RT-PCR (Flu A&B, Covid) Urine, Catheterized  Status: None   Collection Time: 10/17/20 11:24 PM   Specimen: Urine, Catheterized; Nasopharyngeal(NP) swabs in vial transport medium  Result Value Ref Range Status   SARS Coronavirus 2 by RT PCR NEGATIVE NEGATIVE Final    Comment: (NOTE) SARS-CoV-2 target nucleic acids are NOT DETECTED.  The SARS-CoV-2 RNA is generally detectable in upper respiratory specimens during the acute phase of infection. The lowest concentration of SARS-CoV-2 viral copies this assay can detect is 138 copies/mL. A negative result does not preclude SARS-Cov-2 infection and should not be used as the sole basis for treatment or other patient management decisions. A negative result may occur with  improper specimen collection/handling, submission of specimen other than nasopharyngeal swab, presence of viral mutation(s) within the areas targeted by this assay, and inadequate number of viral copies(<138 copies/mL). A negative result must be combined  with clinical observations, patient history, and epidemiological information. The expected result is Negative.  Fact Sheet for Patients:  EntrepreneurPulse.com.au  Fact Sheet for Healthcare Providers:  IncredibleEmployment.be  This test is no t yet approved or cleared by the [place] FDA and  has been authorized for detection and/or diagnosis of SARS-CoV-2 by FDA under an Emergency Use Authorization (EUA). This EUA will remain  in effect (meaning this test can be used) for the duration of the COVID-19 declaration under Section 564(b)(1) of the Act, 21 U.S.C.section 360bbb-3(b)(1), unless the authorization is terminated  or revoked sooner.       Influenza A by PCR NEGATIVE NEGATIVE Final   Influenza B by PCR NEGATIVE NEGATIVE Final    Comment: (NOTE) The Xpert Xpress SARS-CoV-2/FLU/RSV plus assay is intended as an aid in the diagnosis of influenza from Nasopharyngeal swab specimens and should not be used as a sole basis for treatment. Nasal washings and aspirates are unacceptable for Xpert Xpress SARS-CoV-2/FLU/RSV testing.  Fact Sheet for Patients: EntrepreneurPulse.com.au  Fact Sheet for Healthcare Providers: IncredibleEmployment.be  This test is not yet approved or cleared by the [place] FDA and has been authorized for detection and/or diagnosis of SARS-CoV-2 by FDA under an Emergency Use Authorization (EUA). This EUA will remain in effect (meaning this test can be used) for the duration of the COVID-19 declaration under Section 564(b)(1) of the Act, 21 U.S.C. section 360bbb-3(b)(1), unless the authorization is terminated or revoked.  Performed at [facility], [place] [address], [place], [place] [address]   Urine culture     Status: None   Collection Time: 10/17/20 11:24 PM   Specimen: Urine, Catheterized  Result Value Ref Range Status   Specimen Description URINE, CATHETERIZED   Final   Special Requests NONE  Final   Culture   Final    NO GROWTH Performed at [facility], [address], [place], [place] [address]    Report Status 10/19/2020 FINAL  Final      Radiology Studies: No results found.   Scheduled Meds: .  stroke: mapping our early stages of recovery book   Does not apply Once  . apixaban  5 mg Oral BID  . atorvastatin  40 mg Oral Daily  . carvedilol  12.5 mg Oral BID WC  . Chlorhexidine Gluconate Cloth  6 each Topical Daily  . mouth rinse  15 mL Mouth Rinse BID  . methylphenidate  5 mg Oral BID WC   Continuous Infusions: . sodium chloride 75 mL/hr at 10/26/20 0624     LOS: 8 days    Seleena Reimers MD  10/26/2020 at 1:21  PM [facility] Office  [phone]

## 2020-10-27 MED ORDER — FUROSEMIDE 10 MG/ML IJ SOLN
40.0000 mg | Freq: Once | INTRAMUSCULAR | Status: AC
  Administered 2020-10-27: 40 mg via INTRAVENOUS
  Filled 2020-10-27: qty 4

## 2020-10-27 NOTE — Progress Notes (Signed)
  Speech Language Pathology Treatment: Dysphagia;Cognitive-Linquistic  Patient Details Name: Angelica Strickland MRN: [id number] DOB: 1956-03-23 Today's Date: 10/27/2020 Time: 7893-8101 SLP Time Calculation (min) (ACUTE ONLY): 12.83 min  Assessment / Plan / Recommendation Clinical Impression  Pt lethargic today - she accepted sips of water and consistently coughed.  RN reports coughing with breakfast today. BS with crackles per her report.  Recommend returning to nectar thick liquids given concerns for aspiration and lung sounds.  Pt's lethargy is the primary obstacle to progression with diet consistencies.  Re: aphasia, pt followed commands <20% of opportunities.  Limited functional speech could be elicited despite max multimodal cues - when asked if she wanted to sleep, she said clearly "okay." Otherwise, pt maintained eyes closed and was difficult to arouse for participation.  SLP will follow.   HPI HPI: Pt is a 65 yo adm to [facility] after recent fall, new onset of right side weakness, facial asymmetery and aphasia.  Imaging of brain showed moderately large acute to subacute CVA due to Left M1 occlusion.  Pt also with remote bilateral cerebellar cvas.  Cervical spine imaging showed osteophytic changes C5-C6, C6-C7.  PMH + for pituitary tumor, COPD, CHF, smoking. Pt failed [facility] swallow screen.      SLP Plan  Continue with current plan of care       Recommendations  Diet recommendations: Dysphagia 1 (puree);Nectar-thick liquid Liquids provided via: Cup;No straw Medication Administration: Crushed with puree Supervision: Full supervision/cueing for compensatory strategies;Staff to assist with self feeding Compensations: Minimize environmental distractions;Slow rate;Small sips/bites;Follow solids with liquid Postural Changes and/or Swallow Maneuvers: Seated upright 90 degrees                Oral Care Recommendations: Oral care BID SLP Visit Diagnosis: Dysphagia, oropharyngeal phase  (R13.12);Aphasia (R47.01) Plan: Continue with current plan of care       GO                Assunta Curtis 10/27/2020, 11:27 AM  Estill Bamberg L. Tivis Ringer, [place] Office number [phone] Pager [phone]

## 2020-10-27 NOTE — Plan of Care (Signed)
Pt A&OX4, pt compliant with meds and treament. BP 100/79 (BP Location: Right Arm)   Pulse 68   Temp 97.6 F (36.4 C) (Oral)   Resp 18   Wt 98.1 kg   SpO2 99%   BMI 33.87 kg/m  no c/o pain at this time. Pt moved to thickened liquid diet. Pt able to verbalize most needs. Call bell near by, soft touch, bedin lowest position and bed alarm on. Milta Deiters

## 2020-10-27 NOTE — Plan of Care (Signed)
  Problem: Ischemic Stroke/TIA Tissue Perfusion: Goal: Complications of ischemic stroke/TIA will be minimized Outcome: Progressing   Problem: Clinical Measurements: Goal: Ability to maintain clinical measurements within normal limits will improve Outcome: Progressing Goal: Will remain free from infection Outcome: Progressing Goal: Diagnostic test results will improve Outcome: Progressing Goal: Respiratory complications will improve Outcome: Progressing Goal: Cardiovascular complication will be avoided Outcome: Progressing   Problem: Education: Goal: Knowledge of General Education information will improve Description: Including pain rating scale, medication(s)/side effects and non-pharmacologic comfort measures Outcome: Progressing   Problem: Safety: Goal: Ability to remain free from injury will improve Outcome: Progressing

## 2020-10-27 NOTE — Progress Notes (Signed)
Inpatient Rehabilitation Admissions Coordinator  I await more alertness and participation with therapy before proceeding with a possible CIR admit.  Danne Baxter, RN, MSN Rehab Admissions Coordinator [phone] 10/27/2020 12:10 PM

## 2020-10-27 NOTE — Progress Notes (Signed)
PROGRESS NOTE    Angelica Strickland  IOX:[id number] DOB: 1956-02-16 DOA: 10/17/2020 PCP: Birdie Sons, MD   Brief Narrative:   Angelica Strickland is a 65 y.o. female with past medical history of chronic systolic heart failure with latest left ventricular ejection fraction of 20%, paroxysmal atrial fibrillation not on anticoagulation, thrombocytopenia, hypertension, COPD presented to the hospital on 10/17/2020 with acute ischemic stroke with right-sided facial droop and right-sided weakness.  Patient did have expressive aphasia.  Patient was then admitted hospital for management of acute stroke.  Has been seen by rehabilitation team who recommended CIR.  Assessment & Plan   Acute ischemic CVA CT scan showing left MCA infarct with left M1 occlusion on the CT angiogram.  2D echocardiogram with EF of 20 to 25%.  Hemoglobin A1c of 5.3.  Continue aspirin and statin.  Continue dysphagia 1 diet with nectar thick liquids.  Continue Eliquis for anticoagulation.  So far hemoglobin is stable.  Cervical ligamentous injury -MRI showed some prevertebral fluid.  Neurosurgery was consulted.  Conservative treatment.  No need for cervical collar.  Essential hypertension Continue Coreg.  Latest blood pressure of 104/75.  COPD Currently compensated.  Continue albuterol.  Chronic systolic heart failure 2D echocardiogram on 10/19/2020 showed an EF of less than 20%, patient is currently euvolemic and compensated.  Currently lisinopril, and torsemide on hold.  Has been started on Coreg.  Angelica Strickland needs positive balance for 10882 mL.  IV fluids due to n.p.o. status.  Received 1 dose of IV Lasix yesterday.  1 dose of IV Lasix again today.  Paroxysmal atrial fibrillation with RVR  Improved.  CHA2DS2-VASc of 6.  On Coreg and Eliquis.  Currently rate controlled.  Thrombocytopenia Chronic.  Latest platelet count of 151.  No indication of bleeding.  Chronic kidney disease, stage IIIa Stable.  Latest creatinine of  0.9.  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  No acute intervention needed at this time.  Deconditioning,debility status post stroke.  Physical therapy has seen and recommended CIR. on Ritalin for alertness which seems to be fluctuating.  DVT Prophylaxis Lovenox subcu  Code Status: Full code  Family Communication:   I spoke with the patient's sister on the phone and updated her about the clinical condition of the patient.  Disposition Plan:  Status is: Inpatient  Remains inpatient appropriate because:IV treatments appropriate due to intensity of illness or inability to take PO and Inpatient level of care appropriate due to severity of illness  Dispo: The patient is from: Home              Anticipated d/c is to: CIR              Patient currently is not medically stable to d/c.   Difficult to place patient No   Consultants Neurology  Inpatient rehab  Procedures  2 D Echocardiogram  Antibiotics   Anti-infectives (From admission, onward)   None      Subjective:   Today, Angelica Strickland was seen and examined at bedside.  Appears to be more sleepy today.  Says yes or no.    Objective:   Vitals:   10/26/20 1951 10/26/20 2351 10/27/20 0339 10/27/20 0805  BP: 100/65 99/68 101/74 104/75  Pulse: 98 71 79 (!) 105  Resp: 16 17 17 18   Temp: 98.3 F (36.8 C) (!) 97.4 F (36.3 C) (!) 97.5 F (36.4 C) 98.2 F (36.8 C)  TempSrc: Oral Oral Oral Oral  SpO2: 97% 99% 99% 96%  Weight:        Intake/Output Summary (Last 24 hours) at 10/27/2020 1145 Last data filed at 10/27/2020 0700 Gross per 24 hour  Intake 410 ml  Output 1650 ml  Net -1240 ml   Filed Weights   10/23/20 0500 10/25/20 0500 10/26/20 0410  Weight: 88.3 kg 95.4 kg 98.1 kg    Physical exam  General: Obese built, not in obvious distress, somnolent, uttering yes or no HENT:   No scleral pallor or icterus noted. Oral mucosa is moist.  Chest: Breath sounds noted, mild  rhonchi, diminished breath sounds bilaterally.    CVS: S1 &S2 heard.  Irregular rhythm, systolic murmur Abdomen: Soft, nontender, nondistended.  Bowel sounds are heard.  Foley catheter in place. Extremities: No cyanosis, clubbing or edema.  Peripheral pulses are palpable. Psych: Little more somnolent today CNS: Right facial weakness, right-sided weakness, Skin: Warm and dry.  No rashes noted.   Data Reviewed: I have personally reviewed the following lab and imaging studies   CBC: Recent Labs  Lab 10/21/20 0300 10/22/20 0311 10/24/20 0123 10/26/20 0149  WBC 6.1 6.2 6.7 7.5  HGB 10.4* 11.4* 11.2* 11.6*  HCT 32.5* 35.0* 35.2* 35.4*  MCV 100.9* 100.6* 101.1* 101.7*  PLT 101* 112* 121* 350   Basic Metabolic Panel: Recent Labs  Lab 10/22/20 0311 10/24/20 0123 10/26/20 0149  NA 140 139 135  K 4.1 4.9 5.4*  CL 111 116* 113*  CO2 19* 17* 15*  GLUCOSE 104* 109* 118*  BUN 17 28* 38*  CREATININE 0.80 1.00 0.99  CALCIUM 9.0 8.6* 8.6*  MG  --  2.2 2.3  PHOS  --  4.2  --    GFR: CrCl cannot be calculated (Unknown ideal weight.). Liver Function Tests: No results for input(s): AST, ALT, ALKPHOS, BILITOT, PROT, ALBUMIN in the last 168 hours. No results for input(s): LIPASE, AMYLASE in the last 168 hours. No results for input(s): AMMONIA in the last 168 hours. Coagulation Profile: No results for input(s): INR, PROTIME in the last 168 hours. Cardiac Enzymes: No results for input(s): CKTOTAL, CKMB, CKMBINDEX, TROPONINI in the last 168 hours. BNP (last 3 results) No results for input(s): PROBNP in the last 8760 hours. HbA1C: No results for input(s): HGBA1C in the last 72 hours. CBG: No results for input(s): GLUCAP in the last 168 hours. Lipid Profile: No results for input(s): CHOL, HDL, LDLCALC, TRIG, CHOLHDL, LDLDIRECT in the last 72 hours. Thyroid Function Tests: No results for input(s): TSH, T4TOTAL, FREET4, T3FREE, THYROIDAB in the last 72 hours. Anemia Panel: No results  for input(s): VITAMINB12, FOLATE, FERRITIN, TIBC, IRON, RETICCTPCT in the last 72 hours. Urine analysis:    Component Value Date/Time   COLORURINE YELLOW 10/17/2020 2324   APPEARANCEUR CLEAR 10/17/2020 2324   APPEARANCEUR Clear 10/11/2013 1747   LABSPEC 1.015 10/17/2020 2324   LABSPEC 1.008 10/11/2013 1747   PHURINE 5.0 10/17/2020 2324   GLUCOSEU NEGATIVE 10/17/2020 2324   GLUCOSEU Negative 10/11/2013 1747   HGBUR NEGATIVE 10/17/2020 2324   BILIRUBINUR NEGATIVE 10/17/2020 2324   BILIRUBINUR Negative 10/11/2013 1747   KETONESUR NEGATIVE 10/17/2020 2324   PROTEINUR NEGATIVE 10/17/2020 2324   NITRITE NEGATIVE 10/17/2020 2324   LEUKOCYTESUR NEGATIVE 10/17/2020 2324   LEUKOCYTESUR 2+ 10/11/2013 1747   Sepsis Labs: @LABRCNTIP (procalcitonin:4,lacticidven:4)  ) Recent Results (from the past 240 hour(s))  Resp Panel by RT-PCR (Flu A&B, Covid) Urine, Catheterized     Status: None   Collection Time: 10/17/20 11:24 PM  Specimen: Urine, Catheterized; Nasopharyngeal(NP) swabs in vial transport medium  Result Value Ref Range Status   SARS Coronavirus 2 by RT PCR NEGATIVE NEGATIVE Final    Comment: (NOTE) SARS-CoV-2 target nucleic acids are NOT DETECTED.  The SARS-CoV-2 RNA is generally detectable in upper respiratory specimens during the acute phase of infection. The lowest concentration of SARS-CoV-2 viral copies this assay can detect is 138 copies/mL. A negative result does not preclude SARS-Cov-2 infection and should not be used as the sole basis for treatment or other patient management decisions. A negative result may occur with  improper specimen collection/handling, submission of specimen other than nasopharyngeal swab, presence of viral mutation(s) within the areas targeted by this assay, and inadequate number of viral copies(<138 copies/mL). A negative result must be combined with clinical observations, patient history, and epidemiological information. The expected result is  Negative.  Fact Sheet for Patients:  EntrepreneurPulse.com.au  Fact Sheet for Healthcare Providers:  IncredibleEmployment.be  This test is no t yet approved or cleared by the [place] FDA and  has been authorized for detection and/or diagnosis of SARS-CoV-2 by FDA under an Emergency Use Authorization (EUA). This EUA will remain  in effect (meaning this test can be used) for the duration of the COVID-19 declaration under Section 564(b)(1) of the Act, 21 U.S.C.section 360bbb-3(b)(1), unless the authorization is terminated  or revoked sooner.       Influenza A by PCR NEGATIVE NEGATIVE Final   Influenza B by PCR NEGATIVE NEGATIVE Final    Comment: (NOTE) The Xpert Xpress SARS-CoV-2/FLU/RSV plus assay is intended as an aid in the diagnosis of influenza from Nasopharyngeal swab specimens and should not be used as a sole basis for treatment. Nasal washings and aspirates are unacceptable for Xpert Xpress SARS-CoV-2/FLU/RSV testing.  Fact Sheet for Patients: EntrepreneurPulse.com.au  Fact Sheet for Healthcare Providers: IncredibleEmployment.be  This test is not yet approved or cleared by the [place] FDA and has been authorized for detection and/or diagnosis of SARS-CoV-2 by FDA under an Emergency Use Authorization (EUA). This EUA will remain in effect (meaning this test can be used) for the duration of the COVID-19 declaration under Section 564(b)(1) of the Act, 21 U.S.C. section 360bbb-3(b)(1), unless the authorization is terminated or revoked.  Performed at [facility], [place] [address], [place], [place] [address]   Urine culture     Status: None   Collection Time: 10/17/20 11:24 PM   Specimen: Urine, Catheterized  Result Value Ref Range Status   Specimen Description URINE, CATHETERIZED  Final   Special Requests NONE  Final   Culture   Final    NO GROWTH Performed at [facility], [address], [place], [place] [address]    Report Status 10/19/2020 FINAL  Final      Radiology Studies: No results found.   Scheduled Meds: .  stroke: mapping our early stages of recovery book   Does not apply Once  . apixaban  5 mg Oral BID  . atorvastatin  40 mg Oral Daily  . carvedilol  12.5 mg Oral BID WC  . Chlorhexidine Gluconate Cloth  6 each Topical Daily  . mouth rinse  15 mL Mouth Rinse BID  . methylphenidate  5 mg Oral BID WC   Continuous Infusions: . sodium chloride 75 mL/hr at 10/26/20 2039     LOS: 9 days    Flora Lipps MD  10/27/2020 at [place]  [phone]

## 2020-10-27 NOTE — Progress Notes (Signed)
Physical Therapy Treatment Patient Details Name: Angelica Strickland MRN: [id number] DOB: Jun 27, 1955 Today's Date: 10/27/2020    History of Present Illness 65 y/o female presented to ED on 5/27 after being found down with R facial droop, R arm and leg flaccid, and L gaze preference. CT head and MRI found L MCA infarct. PMH: CHF, Afib, HTN, COPD, depression    PT Comments    Pt progressing slowly towards physical therapy goals. Pt very lethargic this session and participation was limited. No verbalizations attempted this session, intermittently grunting to questions therapist was asking. Hand over hand assistance required to bring a washcloth up to her face, and pt was able to continue wiping her face off without assist, missing portions of the R cheek and R side of forehead. Discharge disposition updated to reflect current functional level, participation, and tolerance for physical therapy at this time. Will continue to follow.   Follow Up Recommendations  SNF;Supervision/Assistance - 24 hour     Equipment Recommendations  Other (comment) (TBD)    Recommendations for Other Services       Precautions / Restrictions Precautions Precautions: Fall;Other (comment) Precaution Comments: aphasia Restrictions Weight Bearing Restrictions: No    Mobility  Bed Mobility Overal bed mobility: Needs Assistance Bed Mobility: Supine to Sit;Sit to Supine Rolling: Total assist;+2 for physical assistance;+2 for safety/equipment   Supine to sit: Total assist;+2 for physical assistance;+2 for safety/equipment Sit to supine: Total assist;+2 for physical assistance;+2 for safety/equipment   General bed mobility comments: totalA+2 for all bed mobility with no initiation from patient due to lethargy    Transfers                 General transfer comment: deferred  Ambulation/Gait             General Gait Details: deferred   Stairs             Wheelchair Mobility    Modified  Rankin (Stroke Patients Only) Modified Rankin (Stroke Patients Only) Pre-Morbid Rankin Score: No symptoms Modified Rankin: Severe disability     Balance Overall balance assessment: Needs assistance Sitting-balance support: Single extremity supported;Feet supported Sitting balance-Leahy Scale: Poor Sitting balance - Comments: max-total assist to maintain sitting balance. Postural control: Right lateral lean;Posterior lean                                  Cognition Arousal/Alertness: Lethargic Behavior During Therapy: Flat affect Overall Cognitive Status: Difficult to assess Area of Impairment: Following commands                       Following Commands: Follows one step commands inconsistently;Follows one step commands with increased time              Exercises      General Comments        Pertinent Vitals/Pain Pain Assessment: Faces Faces Pain Scale: No hurt    Home Living                      Prior Function            PT Goals (current goals can now be found in the care plan section) Acute Rehab PT Goals Patient Stated Goal: unable to state PT Goal Formulation: Patient unable to participate in goal setting Time For Goal Achievement: 11/01/20 Potential to Achieve Goals: Fair Progress towards PT goals:  Not progressing toward goals - comment    Frequency    Min 3X/week      PT Plan Discharge plan needs to be updated;Frequency needs to be updated    Co-evaluation              AM-PAC PT "6 Clicks" Mobility   Outcome Measure  Help needed turning from your back to your side while in a flat bed without using bedrails?: Total Help needed moving from lying on your back to sitting on the side of a flat bed without using bedrails?: Total Help needed moving to and from a bed to a chair (including a wheelchair)?: Total Help needed standing up from a chair using your arms (e.g., wheelchair or bedside chair)?: Total Help  needed to walk in hospital room?: Total Help needed climbing 3-5 steps with a railing? : Total 6 Click Score: 6    End of Session Equipment Utilized During Treatment: Gait belt Activity Tolerance: Patient limited by lethargy Patient left: in bed;with call bell/phone within reach;with bed alarm set Nurse Communication: Mobility status PT Visit Diagnosis: Unsteadiness on feet (R26.81);Muscle weakness (generalized) (M62.81);Other abnormalities of gait and mobility (R26.89);Difficulty in walking, not elsewhere classified (R26.2);Other symptoms and signs involving the nervous system (R29.898)     Time: 2947-6546 PT Time Calculation (min) (ACUTE ONLY): 19 min  Charges:  $Therapeutic Activity: 8-22 mins                     Rolinda Roan, PT, DPT Acute Rehabilitation Services Pager: [phone] Office: [phone]    Thelma Comp 10/27/2020, 2:54 PM

## 2020-10-28 LAB — CBC
HCT: 33.9 % — ABNORMAL LOW (ref 36.0–46.0)
Hemoglobin: 10.8 g/dL — ABNORMAL LOW (ref 12.0–15.0)
MCH: 32.7 pg (ref 26.0–34.0)
MCHC: 31.9 g/dL (ref 30.0–36.0)
MCV: 102.7 fL — ABNORMAL HIGH (ref 80.0–100.0)
Platelets: 163 10*3/uL (ref 150–400)
RBC: 3.3 MIL/uL — ABNORMAL LOW (ref 3.87–5.11)
RDW: 14.6 % (ref 11.5–15.5)
WBC: 6.5 10*3/uL (ref 4.0–10.5)
nRBC: 1.8 % — ABNORMAL HIGH (ref 0.0–0.2)

## 2020-10-28 LAB — PHOSPHORUS: Phosphorus: 3.6 mg/dL (ref 2.5–4.6)

## 2020-10-28 LAB — BASIC METABOLIC PANEL
Anion gap: 6 (ref 5–15)
BUN: 32 mg/dL — ABNORMAL HIGH (ref 8–23)
CO2: 21 mmol/L — ABNORMAL LOW (ref 22–32)
Calcium: 8.7 mg/dL — ABNORMAL LOW (ref 8.9–10.3)
Chloride: 111 mmol/L (ref 98–111)
Creatinine, Ser: 0.98 mg/dL (ref 0.44–1.00)
GFR, Estimated: >60 mL/min (ref >60)
Glucose, Bld: 102 mg/dL — ABNORMAL HIGH (ref 70–99)
Potassium: 3.8 mmol/L (ref 3.5–5.1)
Sodium: 138 mmol/L (ref 135–145)

## 2020-10-28 LAB — MAGNESIUM: Magnesium: 2 mg/dL (ref 1.7–2.4)

## 2020-10-28 MED ORDER — FUROSEMIDE 10 MG/ML IJ SOLN
40.0000 mg | Freq: Once | INTRAMUSCULAR | Status: AC
  Administered 2020-10-28: 40 mg via INTRAVENOUS
  Filled 2020-10-28: qty 4

## 2020-10-28 MED ORDER — ACETAMINOPHEN 325 MG PO TABS
650.0000 mg | ORAL_TABLET | ORAL | Status: DC | PRN
  Administered 2020-10-28 – 2020-10-29 (×2): 650 mg via ORAL
  Filled 2020-10-28 (×2): qty 2

## 2020-10-28 NOTE — Progress Notes (Signed)
OT Cancellation Note  Patient Details Name: Angelica Strickland MRN: [id number] DOB: May 30, 1955   Cancelled Treatment:    Reason Eval/Treat Not Completed: Medical issues which prohibited therapy;Other (comment) will check back as time allows for OT session.  Harley Alto., COTA/L Acute Rehabilitation Services [phone]   Precious Haws 10/28/2020, 8:41 AM

## 2020-10-28 NOTE — Plan of Care (Signed)
Pt A&OX4, BP 113/89 (BP Location: Right Arm)   Pulse 99   Temp 97.6 F (36.4 C) (Oral)   Resp (!) 22   Wt 98.1 kg   SpO2 96%   BMI 33.87 kg/m  pt having an easier time breathing after lasixand nebs. No c/o pain at this time . Pt compliant with medications and treatment. No s/s of distress noted at this time.

## 2020-10-28 NOTE — Progress Notes (Signed)
Inpatient Rehabilitation Admissions Coordinator  I contacted pt's sister, [name], by phone. I discussed that Angelica Strickland was not yet at a level to pursue intensive rehab/Cir admit. That SNF is recommended by therapy. [name] is discussing with family about possible discharge home with family. I will follow her progress.   Danne Baxter, RN, MSN Rehab Admissions Coordinator [phone] 10/28/2020 8:58 AM '

## 2020-10-28 NOTE — Tx Team (Signed)
Pt was Tachy during morning vitals. Pt c/o chest pain with some wheezing noted upon assessment. EKG performed pt in A. Fib with RVR. Latest vs BP 110/77 (BP Location: Right Arm)   Pulse 90   Temp 97.7 F (36.5 C) (Oral)   Resp 18   Wt 98.1 kg   SpO2 99%   BMI 33.87 kg/m  Dr. Ree Kida, IV lasix and breathing treatment given. Pt lung sounds improved, pt had no further c/o chest pain. Pt tolerated PO morning meds crushed in apple sauce. IV fluids d/c by Dr.  No further s/s of respiratory distress or chest pain. Angelica Strickland

## 2020-10-28 NOTE — Progress Notes (Signed)
PROGRESS NOTE    Angelica Strickland  IPJ:[id number] DOB: Jun 09, 1955 DOA: 10/17/2020 PCP: Birdie Sons, MD   Brief Narrative:   Angelica Strickland is a 65 y.o. female with past medical history of chronic systolic heart failure with latest left ventricular ejection fraction of 20%, paroxysmal atrial fibrillation not on anticoagulation, thrombocytopenia, hypertension, COPD presented to the hospital on 10/17/2020 with acute ischemic stroke with right-sided facial droop and right-sided weakness.  Patient did have expressive aphasia.  Patient was then admitted hospital for management of acute stroke.  Has been seen by rehabilitation team who recommended CIR.  Assessment & Plan   Acute ischemic CVA CT scan showing left MCA infarct with left M1 occlusion on the CT angiogram.  2D echocardiogram with EF of 20 to 25%.  Hemoglobin A1c of 5.3.  Continue aspirin and statin.  Continue dysphagia 1 diet with nectar thick liquids.  Continue Eliquis for anticoagulation.  So far hemoglobin is stable.  Cervical ligamentous injury -MRI showed some prevertebral fluid.  Neurosurgery was consulted.  Conservative treatment.  No need for cervical collar.  Essential hypertension Continue Coreg.  Patient is still sober.  COPD Continue albuterol.  Feels little congested.  Chronic systolic heart failure 2D echocardiogram on 10/19/2020 showed an EF of less than 20%, patient feels little congested today.  Currently lisinopril and torsemide on hold.  On Coreg. Marland Kitchen  Received 1 dose of IV Lasix yesterday.  1 dose of IV Lasix again today this a.m. and will repeat in the afternoon.  Will DC IV fluids.  Spoke with the nursing staff and the patient is starting to drink little more now..  Paroxysmal atrial fibrillation with RVR  Improved.  CHA2DS2-VASc of 6.  On Coreg and Eliquis.  Currently rate controlled.  Thrombocytopenia Chronic.  Latest platelet count of 163.  No indication of bleeding.  Chronic kidney disease, stage  IIIa Stable.  Latest creatinine of 0.9.  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  No acute intervention needed at this time.  Deconditioning,debility status post stroke.  Physical therapy has seen and recommended CIR. on Ritalin for alertness which seems to be fluctuating.  DVT Prophylaxis Lovenox subcu  Code Status: Full code  Family Communication:   None today.  I spoke with the patient's sister on the phone on 10/27/2020  Disposition Plan:  Status is: Inpatient  Remains inpatient appropriate because:IV treatments appropriate due to intensity of illness or inability to take PO and Inpatient level of care appropriate due to severity of illness  Dispo: The patient is from: Home              Anticipated d/c is to: CIR/SNF              Patient currently is not medically stable to d/c.   Difficult to place patient No   Consultants Neurology  Inpatient rehab  Procedures  2 D Echocardiogram  Antibiotics   Anti-infectives (From admission, onward)   None      Subjective:   Today, patient was seen and examined at bedside.  Nursing staff reported that she was feeling little congested.  Objective:   Vitals:   10/28/20 0442 10/28/20 0812 10/28/20 0827 10/28/20 1110  BP: 101/72 93/62 110/77 113/89  Pulse:  98 90 99  Resp: 20 20 18  (!) 22  Temp: 97.7 F (36.5 C) 97.6 F (36.4 C) 97.7 F (36.5 C) 97.6 F (36.4 C)  TempSrc: Oral Oral Oral Oral  SpO2: 96% 99% 99% 96%  Weight:        Intake/Output Summary (Last 24 hours) at 10/28/2020 1235 Last data filed at 10/28/2020 1132 Gross per 24 hour  Intake 240 ml  Output 2000 ml  Net -1760 ml   Filed Weights   10/23/20 0500 10/25/20 0500 10/26/20 0410  Weight: 88.3 kg 95.4 kg 98.1 kg    Physical exam  General: Obese built, not in obvious distress, alert awake, uttering yes or no HENT:   No scleral pallor or icterus noted. Oral mucosa is moist.  Chest: Breath sounds  noted, mild rhonchi, diminished breath sounds bilaterally.    CVS: S1 &S2 heard.  Irregular rhythm, systolic murmur Abdomen: Soft, nontender, nondistended.  Bowel sounds are heard.  Foley catheter in place. Extremities: No cyanosis, clubbing or edema.  Peripheral pulses are palpable. Psych: Alert awake and responsive. CNS: Right facial weakness, right-sided weakness, Skin: Warm and dry.  No rashes noted.   Data Reviewed: I have personally reviewed the following lab and imaging studies   CBC: Recent Labs  Lab 10/22/20 0311 10/24/20 0123 10/26/20 0149 10/28/20 0515  WBC 6.2 6.7 7.5 6.5  HGB 11.4* 11.2* 11.6* 10.8*  HCT 35.0* 35.2* 35.4* 33.9*  MCV 100.6* 101.1* 101.7* 102.7*  PLT 112* 121* 151 937   Basic Metabolic Panel: Recent Labs  Lab 10/22/20 0311 10/24/20 0123 10/26/20 0149 10/28/20 0515  NA 140 139 135 138  K 4.1 4.9 5.4* 3.8  CL 111 116* 113* 111  CO2 19* 17* 15* 21*  GLUCOSE 104* 109* 118* 102*  BUN 17 28* 38* 32*  CREATININE 0.80 1.00 0.99 0.98  CALCIUM 9.0 8.6* 8.6* 8.7*  MG  --  2.2 2.3 2.0  PHOS  --  4.2  --  3.6   GFR: CrCl cannot be calculated (Unknown ideal weight.). Liver Function Tests: No results for input(s): AST, ALT, ALKPHOS, BILITOT, PROT, ALBUMIN in the last 168 hours. No results for input(s): LIPASE, AMYLASE in the last 168 hours. No results for input(s): AMMONIA in the last 168 hours. Coagulation Profile: No results for input(s): INR, PROTIME in the last 168 hours. Cardiac Enzymes: No results for input(s): CKTOTAL, CKMB, CKMBINDEX, TROPONINI in the last 168 hours. BNP (last 3 results) No results for input(s): PROBNP in the last 8760 hours. HbA1C: No results for input(s): HGBA1C in the last 72 hours. CBG: No results for input(s): GLUCAP in the last 168 hours. Lipid Profile: No results for input(s): CHOL, HDL, LDLCALC, TRIG, CHOLHDL, LDLDIRECT in the last 72 hours. Thyroid Function Tests: No results for input(s): TSH, T4TOTAL, FREET4,  T3FREE, THYROIDAB in the last 72 hours. Anemia Panel: No results for input(s): VITAMINB12, FOLATE, FERRITIN, TIBC, IRON, RETICCTPCT in the last 72 hours. Urine analysis:    Component Value Date/Time   COLORURINE YELLOW 10/17/2020 2324   APPEARANCEUR CLEAR 10/17/2020 2324   APPEARANCEUR Clear 10/11/2013 1747   LABSPEC 1.015 10/17/2020 2324   LABSPEC 1.008 10/11/2013 1747   PHURINE 5.0 10/17/2020 2324   GLUCOSEU NEGATIVE 10/17/2020 2324   GLUCOSEU Negative 10/11/2013 1747   HGBUR NEGATIVE 10/17/2020 2324   BILIRUBINUR NEGATIVE 10/17/2020 2324   BILIRUBINUR Negative 10/11/2013 1747   KETONESUR NEGATIVE 10/17/2020 2324   PROTEINUR NEGATIVE 10/17/2020 2324   NITRITE NEGATIVE 10/17/2020 2324   LEUKOCYTESUR NEGATIVE 10/17/2020 2324   LEUKOCYTESUR 2+ 10/11/2013 1747   Sepsis Labs: @LABRCNTIP (procalcitonin:4,lacticidven:4)  ) No results found for this or any previous visit (from the past 240 hour(s)).    Radiology  Studies: No results found.   Scheduled Meds: .  stroke: mapping our early stages of recovery book   Does not apply Once  . apixaban  5 mg Oral BID  . atorvastatin  40 mg Oral Daily  . carvedilol  12.5 mg Oral BID WC  . Chlorhexidine Gluconate Cloth  6 each Topical Daily  . furosemide  40 mg Intravenous Once  . mouth rinse  15 mL Mouth Rinse BID  . methylphenidate  5 mg Oral BID WC   Continuous Infusions:    LOS: 10 days    Flora Lipps MD  10/28/2020 at 12:35 PM [facility] Office  [phone]

## 2020-10-29 ENCOUNTER — Inpatient Hospital Stay (HOSPITAL_COMMUNITY): Payer: Medicare Other

## 2020-10-29 LAB — COMPREHENSIVE METABOLIC PANEL
ALT: 206 U/L — ABNORMAL HIGH (ref 0–44)
AST: 59 U/L — ABNORMAL HIGH (ref 15–41)
Albumin: 3.3 g/dL — ABNORMAL LOW (ref 3.5–5.0)
Alkaline Phosphatase: 86 U/L (ref 38–126)
Anion gap: 9 (ref 5–15)
BUN: 30 mg/dL — ABNORMAL HIGH (ref 8–23)
CO2: 25 mmol/L (ref 22–32)
Calcium: 8.7 mg/dL — ABNORMAL LOW (ref 8.9–10.3)
Chloride: 107 mmol/L (ref 98–111)
Creatinine, Ser: 0.98 mg/dL (ref 0.44–1.00)
GFR, Estimated: >60 mL/min (ref >60)
Glucose, Bld: 108 mg/dL — ABNORMAL HIGH (ref 70–99)
Potassium: 3.6 mmol/L (ref 3.5–5.1)
Sodium: 141 mmol/L (ref 135–145)
Total Bilirubin: 0.9 mg/dL (ref 0.3–1.2)
Total Protein: 6.2 g/dL — ABNORMAL LOW (ref 6.5–8.1)

## 2020-10-29 LAB — CBC
HCT: 35.9 % — ABNORMAL LOW (ref 36.0–46.0)
Hemoglobin: 11.4 g/dL — ABNORMAL LOW (ref 12.0–15.0)
MCH: 32.5 pg (ref 26.0–34.0)
MCHC: 31.8 g/dL (ref 30.0–36.0)
MCV: 102.3 fL — ABNORMAL HIGH (ref 80.0–100.0)
Platelets: 167 10*3/uL (ref 150–400)
RBC: 3.51 MIL/uL — ABNORMAL LOW (ref 3.87–5.11)
RDW: 15.1 % (ref 11.5–15.5)
WBC: 6.9 10*3/uL (ref 4.0–10.5)
nRBC: 0.7 % — ABNORMAL HIGH (ref 0.0–0.2)

## 2020-10-29 LAB — MAGNESIUM: Magnesium: 2 mg/dL (ref 1.7–2.4)

## 2020-10-29 LAB — PHOSPHORUS: Phosphorus: 3.6 mg/dL (ref 2.5–4.6)

## 2020-10-29 MED ORDER — FUROSEMIDE 10 MG/ML IJ SOLN
40.0000 mg | Freq: Once | INTRAMUSCULAR | Status: AC
  Administered 2020-10-29: 40 mg via INTRAVENOUS
  Filled 2020-10-29: qty 4

## 2020-10-29 MED ORDER — FUROSEMIDE 10 MG/ML IJ SOLN
40.0000 mg | Freq: Two times a day (BID) | INTRAMUSCULAR | Status: DC
  Administered 2020-10-29 – 2020-10-31 (×4): 40 mg via INTRAVENOUS
  Filled 2020-10-29 (×4): qty 4

## 2020-10-29 NOTE — Progress Notes (Signed)
Occupational Therapy Treatment Patient Details Name: Angelica Strickland MRN: [id number] DOB: 05-24-1956 Today's Date: 10/29/2020    History of present illness 65 y/o female presented to ED on 5/27 after being found down with R facial droop, R arm and leg flaccid, and L gaze preference. CT head and MRI found L MCA infarct. PMH: CHF, Afib, HTN, COPD, depression   OT comments  Pt seen in conjunction with PT to maximize pts activity tolerance, however pt continues to be flat and limited by decreased activity tolerance, aphasia and impaired balance. Pt currently requries MAX A +2 for bed mobility and MIN - MAX A for static sitting balance. Pt required total A for UB grooming tasks this session. Pt did state "I'm not stupid" when asking pt to sit EOB. Pt would continue to benefit from skilled occupational therapy while admitted and after d/c to address the below listed limitations in order to improve overall functional mobility and facilitate independence with BADL participation. DC plan remains appropriate, will follow acutely per POC.    Follow Up Recommendations  CIR    Equipment Recommendations  Other (comment) (TBD)    Recommendations for Other Services      Precautions / Restrictions Precautions Precautions: Fall;Other (comment) Precaution Comments: aphasia Restrictions Weight Bearing Restrictions: No       Mobility Bed Mobility Overal bed mobility: Needs Assistance Bed Mobility: Supine to Sit;Sit to Supine     Supine to sit: Max assist;+2 for physical assistance Sit to supine: Max assist;+2 for physical assistance   General bed mobility comments: MAX A +2 to transition to EOB to pts L side, pt able to use LUE to assist with elevating trunk into sitting but pt ultimately required MAX A +2 to transition from supine>sitting    Transfers                 General transfer comment: deferred    Balance Overall balance assessment: Needs assistance Sitting-balance support:  Single extremity supported;Feet supported Sitting balance-Leahy Scale: Poor Sitting balance - Comments: sitting balance ranging from MIN - MAX A +1       Standing balance comment: NT                           ADL either performed or assessed with clinical judgement   ADL Overall ADL's : Needs assistance/impaired     Grooming: Wash/dry face;Total assistance Grooming Details (indicate cue type and reason): pt with no initiation to grab wash cloth from OTA with pt needing total A to complete task                   Toilet Transfer Details (indicate cue type and reason): deferred d/t level of arousal         Functional mobility during ADLs: Maximal assistance;+2 for physical assistance (bed mobility only) General ADL Comments: pt continues to present with aphasia, impaired balance, decreased activity tolerance and R hemiplegia     Vision       Perception     Praxis      Cognition Arousal/Alertness: Awake/alert;Lethargic (moments of lethargy and moments of wakefulness) Behavior During Therapy: Flat affect ("annoyed" at times) Overall Cognitive Status: Difficult to assess                                 General Comments: Following <25% commands this session, moments of wakefulness and  moments of lethargy with pt attempting to lay herself back down. pt did state "I'm not stupid" when asking pt to transition to EOB        Exercises     Shoulder Instructions       General Comments pt on RA with SpO2 98% prior to mobility, pts HR 78 bpm at rest and MAX 112 bpm with activity but fluctuating from 90- 112 bpm during activity    Pertinent Vitals/ Pain       Pain Assessment: Faces Faces Pain Scale: No hurt  Home Living                                          Prior Functioning/Environment              Frequency  Min 2X/week        Progress Toward Goals  OT Goals(current goals can now be found in the care plan  section)  Progress towards OT goals: Progressing toward goals  Acute Rehab OT Goals Patient Stated Goal: unable to state OT Goal Formulation: Patient unable to participate in goal setting Time For Goal Achievement: 11/01/20  Plan Discharge plan remains appropriate;Frequency remains appropriate    Co-evaluation      Reason for Co-Treatment: For patient/therapist safety;To address functional/ADL transfers   OT goals addressed during session: ADL's and self-care      AM-PAC OT "6 Clicks" Daily Activity     Outcome Measure   Help from another person eating meals?: Total Help from another person taking care of personal grooming?: Total Help from another person toileting, which includes using toliet, bedpan, or urinal?: Total Help from another person bathing (including washing, rinsing, drying)?: Total Help from another person to put on and taking off regular upper body clothing?: Total Help from another person to put on and taking off regular lower body clothing?: Total 6 Click Score: 6    End of Session    OT Visit Diagnosis: Other abnormalities of gait and mobility (R26.89);Muscle weakness (generalized) (M62.81);Cognitive communication deficit (R41.841);Other symptoms and signs involving cognitive function;Hemiplegia and hemiparesis Hemiplegia - Right/Left: Right   Activity Tolerance Patient tolerated treatment well   Patient Left in bed;with call bell/phone within reach;with bed alarm set   Nurse Communication Mobility status        Time: 5701-7793 OT Time Calculation (min): 24 min  Charges: OT General Charges $OT Visit: 1 Visit OT Treatments $Self Care/Home Management : 8-22 mins  Harley Alto., COTA/L Acute Rehabilitation Services [phone]    Precious Haws 10/29/2020, 10:10 AM

## 2020-10-29 NOTE — TOC Initial Note (Addendum)
Transition of Care [facility]) - Initial/Assessment Note    Patient Details  Name: Angelica Strickland MRN: [id number] Date of Birth: January 19, 1956  [facility] CM/SW Contact:    Geralynn Ochs, LCSW Phone Number: 10/29/2020, 12:21 PM  Clinical Narrative:        CSW spoke with [name] about recommendation for SNF, and whether she wants to pursue SNF or take the patient home. [name] indicated that she would need time to get her house situated for the patient to come, and she was hoping the patient would be in the hospital for a couple more weeks for her to get the house ready. CSW discussed that she couldn't stay in the hospital that long, but could if she went to SNF. [name] indicated that she would consider SNF in [place], interested in [place] or [place]. CSW sent out referral, asked them to review. CSW to follow.          UPDATE 1:07 PM: CSW received call back from [facility] that she would also consider [facility] for SNF. [name] said she is going to start working on modifying her home now so she'll be ready when the patient is done with rehab, because she is absolutely not placing her in a nursing home long term. CSW sent referral to [facility], waiting on responses. [facility] has accepted.   Expected Discharge Plan: Skilled Nursing Facility Barriers to Discharge: Continued Medical Work up,Insurance Authorization   Patient Goals and CMS Choice Patient states their goals for this hospitalization and ongoing recovery are:: patient unable to participate in goal setting, not fully oriented CMS Medicare.gov Compare Post Acute Care list provided to:: Patient Represenative (must comment) Choice offered to / list presented to : Sibling  Expected Discharge Plan and Services Expected Discharge Plan: [place] Choice: [place] arrangements for the past 2 months: Single Family Home                                      Prior  Living Arrangements/Services Living arrangements for the past 2 months: Single Family Home Lives with:: Self Patient language and need for interpreter reviewed:: No Do you feel safe going back to the place where you live?: Yes      Need for Family Participation in Patient Care: Yes (Comment) Care giver support system in place?: Yes (comment)   Criminal Activity/Legal Involvement Pertinent to Current Situation/Hospitalization: No - Comment as needed  Activities of Daily Living      Permission Sought/Granted Permission sought to share information with : Facility Retail banker granted to share information with : Yes, Verbal Permission Granted  Share Information with NAME: [name]  Permission granted to share info w AGENCY: SNF  Permission granted to share info w Relationship: Sister     Emotional Assessment   Attitude/Demeanor/Rapport: Unable to Assess Affect (typically observed): Unable to Assess Orientation: : Oriented to Self,Oriented to Place,Oriented to Situation Alcohol / Substance Use: Not Applicable Psych Involvement: No (comment)  Admission diagnosis:  Acute ischemic left MCA stroke ([place]) [I63.512] Acute ischemic stroke [facility]) [I63.9] Patient Active Problem List   Diagnosis Date Noted  . Acute ischemic stroke ([place]) 10/18/2020  . Chronic systolic CHF (congestive heart failure) ([place]) 10/18/2020  . History of smoking 30 or more pack years 04/27/2016  . Body mass index (BMI) of 26.0-26.9 in adult 04/10/2015  .  CCF (congestive cardiac failure) ([place]) 04/10/2015  . H/O neoplasm 04/10/2015  . Atrial fibrillation ([place]) 11/11/2014  . Depression 11/11/2014  . Essential hypertension 11/11/2014  . COPD (chronic obstructive pulmonary disease) ([place]) 11/11/2014  . Osteoarthritis 11/11/2014  . Thrombocytopenia ([place]) 11/11/2014  . Carpal tunnel syndrome 11/11/2014  . History of pituitary tumor 11/11/2014  . Abnormal CAT scan 01/07/2012  .  Cardiomyopathy ([place]) 01/07/2012  . 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 History of cardiac catheterization 01/07/2012   PCP:  Birdie Sons, MD Pharmacy:   [facility] [address] [place] [address] [place]) [place] [address] Phone: [phone] Fax: [place] [address] [place] [address] Phone: [phone] Fax: [phone]     Social Determinants of Health (SDOH) Interventions    Readmission Risk Interventions No flowsheet data found.

## 2020-10-29 NOTE — Progress Notes (Signed)
PROGRESS NOTE    PARA COSSEY  LYY:[id number] DOB: [birth year] DOA: 10/17/2020 PCP: Birdie Sons, MD   Brief Narrative:   Angelica Strickland is a 65 y.o. female with past medical history of chronic systolic heart failure with latest left ventricular ejection fraction of 20%, paroxysmal atrial fibrillation not on anticoagulation, thrombocytopenia, hypertension, COPD presented to the hospital on 10/17/2020 with acute ischemic stroke with right-sided facial droop and right-sided weakness.  Patient did have expressive aphasia.  Patient was then admitted hospital for management of acute stroke.  Patient has been seen by rehabilitation team who recommended CIR.  Patient has been having dysphagia and is on a dysphagia 1 diet.  Had received IV fluids due to lack of oral intake.  Has been started back on Lasix for diuresis.  Assessment & Plan   Acute ischemic CVA CT scan showing left MCA infarct with left M1 occlusion on the CT angiogram.  2D echocardiogram with EF of 20 to 25%.  Hemoglobin A1c of 5.3.  Continue aspirin and statin.  Continue dysphagia 1 diet with nectar thick liquids.  Chance of aspiration yesterday.  Continue Eliquis for anticoagulation.  So far hemoglobin is stable.  Latest hemoglobin of 11.4.  Cervical ligamentous injury -MRI showed some prevertebral fluid.  Neurosurgery was consulted.  Conservative treatment.  No need for cervical collar.  Essential hypertension Continue Coreg.  Blood pressure 105/64  COPD Continue nebulizers inhalers, IV Lasix has been started.  Acute on chronic systolic heart failure 2D echocardiogram on 10/19/2020 showed an EF of less than 20%, still with congestion.  Likely volume overload from IV fluids and lack of oral intake.  Will obtain chest x-ray.  Currently lisinopril and torsemide on hold.  On Coreg off IV fluids.  Will restart Lasix 40 mg IV twice daily.  Monitor electrolytes.  Paroxysmal atrial fibrillation with RVR  Improved.  CHA2DS2-VASc of  6.  On Coreg and Eliquis.  Currently rate controlled.  Thrombocytopenia Improved..  Latest platelet count of 167.  No indication of bleeding.  Chronic kidney disease, stage IIIa Stable.  Latest creatinine of 0.9.  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  No acute intervention needed at this time.  Deconditioning,debility status post stroke.  Physical therapy has seen and recommended CIR. on Ritalin for alertness which seems to be fluctuating.  DVT Prophylaxis Lovenox subcu  Code Status: Full code  Family Communication:   None today.  I spoke with the patient's sister on the phone on 10/27/2020  Disposition Plan:  Status is: Inpatient  Remains inpatient appropriate because:IV treatments appropriate due to intensity of illness or inability to take PO and Inpatient level of care appropriate due to severity of illness  Dispo: The patient is from: Home              Anticipated d/c is to: CIR/SNF              Patient currently is not medically stable to d/c.   Difficult to place patient No   Consultants Neurology  Inpatient rehab  Procedures  2 D Echocardiogram  Antibiotics   Anti-infectives (From admission, onward)   None      Subjective:   Today, patient was seen and examined at bedside.  Still feels a little congested as per the nursing staff.  Patient responds with words like "huh"  Objective:   Vitals:   10/28/20 1624 10/28/20 1946 10/29/20 0000 10/29/20 0810  BP: 127/80 104/74 109/64  105/64  Pulse: (!) 107 78 67 75  Resp: 18 18 18  (!) 21  Temp: 97.7 F (36.5 C) 97.6 F (36.4 C) 98 F (36.7 C) 97.7 F (36.5 C)  TempSrc: Oral Oral Oral Oral  SpO2: 98% 97% 97% 95%  Weight:        Intake/Output Summary (Last 24 hours) at 10/29/2020 1122 Last data filed at 10/28/2020 1622 Gross per 24 hour  Intake 60 ml  Output 1500 ml  Net -1440 ml   Filed Weights   10/23/20 0500 10/25/20 0500 10/26/20 0410  Weight:  88.3 kg 95.4 kg 98.1 kg    Physical exam  General: Obese built, not in obvious distress, utters "uh" HENT:   No scleral pallor or icterus noted. Oral mucosa is moist.  Chest: Diminished breath sounds bilaterally.  Coarse breath sounds noted. CVS: S1 &S2 heard.  Irregular rhythm, systolic murmur Abdomen: Soft, nontender, nondistended.  Bowel sounds are heard.  Catheter in place. Extremities: No cyanosis, clubbing or edema.  Peripheral pulses are palpable. Psych: Alert, awake responsive, CNS: Facial and right-sided weakness. Skin: Warm and dry.  No rashes noted.   Data Reviewed: I have personally reviewed the following lab and imaging studies   CBC: Recent Labs  Lab 10/24/20 0123 10/26/20 0149 10/28/20 0515 10/29/20 0451  WBC 6.7 7.5 6.5 6.9  HGB 11.2* 11.6* 10.8* 11.4*  HCT 35.2* 35.4* 33.9* 35.9*  MCV 101.1* 101.7* 102.7* 102.3*  PLT 121* 151 163 364   Basic Metabolic Panel: Recent Labs  Lab 10/24/20 0123 10/26/20 0149 10/28/20 0515 10/29/20 0451  NA 139 135 138 141  K 4.9 5.4* 3.8 3.6  CL 116* 113* 111 107  CO2 17* 15* 21* 25  GLUCOSE 109* 118* 102* 108*  BUN 28* 38* 32* 30*  CREATININE 1.00 0.99 0.98 0.98  CALCIUM 8.6* 8.6* 8.7* 8.7*  MG 2.2 2.3 2.0 2.0  PHOS 4.2  --  3.6 3.6   GFR: CrCl cannot be calculated (Unknown ideal weight.). Liver Function Tests: Recent Labs  Lab 10/29/20 0451  AST 59*  ALT 206*  ALKPHOS 86  BILITOT 0.9  PROT 6.2*  ALBUMIN 3.3*   No results for input(s): LIPASE, AMYLASE in the last 168 hours. No results for input(s): AMMONIA in the last 168 hours. Coagulation Profile: No results for input(s): INR, PROTIME in the last 168 hours. Cardiac Enzymes: No results for input(s): CKTOTAL, CKMB, CKMBINDEX, TROPONINI in the last 168 hours. BNP (last 3 results) No results for input(s): PROBNP in the last 8760 hours. HbA1C: No results for input(s): HGBA1C in the last 72 hours. CBG: No results for input(s): GLUCAP in the last 168  hours. Lipid Profile: No results for input(s): CHOL, HDL, LDLCALC, TRIG, CHOLHDL, LDLDIRECT in the last 72 hours. Thyroid Function Tests: No results for input(s): TSH, T4TOTAL, FREET4, T3FREE, THYROIDAB in the last 72 hours. Anemia Panel: No results for input(s): VITAMINB12, FOLATE, FERRITIN, TIBC, IRON, RETICCTPCT in the last 72 hours. Urine analysis:    Component Value Date/Time   COLORURINE YELLOW 10/17/2020 2324   APPEARANCEUR CLEAR 10/17/2020 2324   APPEARANCEUR Clear 10/11/2013 1747   LABSPEC 1.015 10/17/2020 2324   LABSPEC 1.008 10/11/2013 1747   PHURINE 5.0 10/17/2020 2324   GLUCOSEU NEGATIVE 10/17/2020 2324   GLUCOSEU Negative 10/11/2013 1747   HGBUR NEGATIVE 10/17/2020 2324   BILIRUBINUR NEGATIVE 10/17/2020 2324   BILIRUBINUR Negative 10/11/2013 [place] 10/17/2020 2324   PROTEINUR NEGATIVE 10/17/2020 2324   NITRITE NEGATIVE 10/17/2020 2324  LEUKOCYTESUR NEGATIVE 10/17/2020 2324   LEUKOCYTESUR 2+ 10/11/2013 1747   Sepsis Labs: @LABRCNTIP (procalcitonin:4,lacticidven:4)  ) No results found for this or any previous visit (from the past 240 hour(s)).    Radiology Studies: No results found.   Scheduled Meds: .  stroke: mapping our early stages of recovery book   Does not apply Once  . apixaban  5 mg Oral BID  . atorvastatin  40 mg Oral Daily  . carvedilol  12.5 mg Oral BID WC  . Chlorhexidine Gluconate Cloth  6 each Topical Daily  . furosemide  40 mg Intravenous BID  . mouth rinse  15 mL Mouth Rinse BID  . methylphenidate  5 mg Oral BID WC   Continuous Infusions:    LOS: 11 days    Flora Lipps MD  10/29/2020 at [facility]  [phone]

## 2020-10-29 NOTE — Progress Notes (Signed)
Physical Therapy Treatment Patient Details Name: Angelica Strickland MRN: [id number] DOB: 1955/06/01 Today's Date: 10/29/2020    History of Present Illness 65 y/o female presented to ED on 5/27 after being found down with R facial droop, R arm and leg flaccid, and L gaze preference. CT head and MRI found L MCA infarct. PMH: CHF, Afib, HTN, COPD, depression    PT Comments    Patient seen in conjunction with OT to maximize patient's tolerance. Patient continues to limited by decreased activity tolerance and aphasia. Patient required maxA+2 for bed mobility this session with patient initiating trunk elevation with use of L UE. Patient required min-maxA to maintain sitting balance EOB with L UE support. Patient with increased WOB throughout session with spO2 98%. HR fluctuating 90-112 bpm with activity. Continue to recommend SNF for ongoing Physical Therapy.       Follow Up Recommendations  SNF;Supervision/Assistance - 24 hour     Equipment Recommendations  Other (comment) (TBD)    Recommendations for Other Services       Precautions / Restrictions Precautions Precautions: Fall;Other (comment) Precaution Comments: aphasia Restrictions Weight Bearing Restrictions: No    Mobility  Bed Mobility Overal bed mobility: Needs Assistance Bed Mobility: Supine to Sit;Sit to Supine     Supine to sit: Max assist;+2 for physical assistance Sit to supine: Max assist;+2 for physical assistance   General bed mobility comments: MaxA +2 to transition to EOB to pts L side, patient able to use LUE to assist with elevating trunk into sitting but pt ultimately required maxA +2 to transition from supine>sit    Transfers                 General transfer comment: deferred  Ambulation/Gait                 Stairs             Wheelchair Mobility    Modified Rankin (Stroke Patients Only) Modified Rankin (Stroke Patients Only) Pre-Morbid Rankin Score: No symptoms Modified Rankin:  Severe disability     Balance Overall balance assessment: Needs assistance Sitting-balance support: Single extremity supported;Feet supported Sitting balance-Leahy Scale: Poor Sitting balance - Comments: sitting balance ranging from MIN - MAX A +1 Postural control: Right lateral lean;Posterior lean     Standing balance comment: NT                            Cognition Arousal/Alertness: Awake/alert;Lethargic (moments of lethargy and moments of wakefulness) Behavior During Therapy: Flat affect ("annoyed" at times) Overall Cognitive Status: Difficult to assess Area of Impairment: Following commands                       Following Commands: Follows one step commands inconsistently;Follows one step commands with increased time       General Comments: Following <25% commands this session, moments of wakefulness and moments of lethargy with pt attempting to lay herself back down. pt did state "I'm not stupid" when asking pt to transition to EOB      Exercises      General Comments General comments (skin integrity, edema, etc.): pt on RA with SpO2 98% prior to mobility, pts HR 78 bpm at rest and MAX 112 bpm with activity but fluctuating from 90- 112 bpm during activity      Pertinent Vitals/Pain Pain Assessment: Faces Faces Pain Scale: No hurt Pain Intervention(s): Monitored during session  Home Living                      Prior Function            PT Goals (current goals can now be found in the care plan section) Acute Rehab PT Goals Patient Stated Goal: unable to state PT Goal Formulation: Patient unable to participate in goal setting Time For Goal Achievement: 11/01/20 Potential to Achieve Goals: Fair Progress towards PT goals: Not progressing toward goals - comment    Frequency    Min 3X/week      PT Plan Current plan remains appropriate    Co-evaluation PT/OT/SLP Co-Evaluation/Treatment: Yes Reason for Co-Treatment: For  patient/therapist safety;To address functional/ADL transfers PT goals addressed during session: Mobility/safety with mobility;Balance OT goals addressed during session: ADL's and self-care      AM-PAC PT "6 Clicks" Mobility   Outcome Measure  Help needed turning from your back to your side while in a flat bed without using bedrails?: Total Help needed moving from lying on your back to sitting on the side of a flat bed without using bedrails?: Total Help needed moving to and from a bed to a chair (including a wheelchair)?: Total Help needed standing up from a chair using your arms (e.g., wheelchair or bedside chair)?: Total Help needed to walk in hospital room?: Total Help needed climbing 3-5 steps with a railing? : Total 6 Click Score: 6    End of Session   Activity Tolerance: Patient limited by lethargy Patient left: in bed;with call bell/phone within reach;with bed alarm set Nurse Communication: Mobility status PT Visit Diagnosis: Unsteadiness on feet (R26.81);Muscle weakness (generalized) (M62.81);Other abnormalities of gait and mobility (R26.89);Difficulty in walking, not elsewhere classified (R26.2);Other symptoms and signs involving the nervous system (R91.638)     Time: 4665-9935 PT Time Calculation (min) (ACUTE ONLY): 24 min  Charges:  $Therapeutic Activity: 8-22 mins                     Dakota Stangl A. Gilford Rile PT, DPT Acute Rehabilitation Services Pager [phone] Office [phone]    Linna Hoff 10/29/2020, 10:24 AM

## 2020-10-29 NOTE — Progress Notes (Addendum)
  Speech Language Pathology Treatment: Dysphagia  Patient Details Name: Angelica Strickland MRN: [id number] DOB: 1955-10-01 Today's Date: 10/29/2020 Time: 9233-0076 SLP Time Calculation (min) (ACUTE ONLY): 17 min  Assessment / Plan / Recommendation Clinical Impression  Today pt greeted - lying in bed, sleeping with elevated RR.  She awoke to SLP moderate verbal/tactile stimulation and required verbal/visual cues to remain alert/participte in session.    Noted pt with oral retention of ? Secretions in right lateral and anterior sulci without pt awareness.  Cleared this with oral suctioning kit with toothbrush.    After session, SLP spoke to NT who reports she fed pt breakfast and observed some dyspnea which worsened with po intake.  She states she fed pt pancakes and eggs - stating she cleared pt's mouth after meal.    Pt reports it was secretions retained in mouth - not food - but her aphasia impacts reliability of statement.  Thus SLP provided pt with tsps of nectar liquids only.  Increase dyspnea and wheeze noted with minimal po intake- no indication of aspiraiton.   Due to pt's dysphagia, current respiratory status and motor planning deficits preventing pt from producing volitional cough, recommend she only consume tsps of nectar thick liquids via tsp today if fully alert and not dyspneic. Hopefully this will be a short term modification to accommodate pt.   Informed pt to concerns and plans - uncertain to her ability to fully understand situation due to aphasia/current mentation.   Will follow closely for dysphagia management and aphasia treatment.  Advised RN and NT to recommendations and concerns.      HPI HPI: Pt is a 65 yo adm to [facility] after recent fall, new onset of right side weakness, facial asymmetery and aphasia.  Imaging of brain showed moderately large acute to subacute CVA due to Left M1 occlusion.  Pt also with remote bilateral cerebellar cvas.  Cervical spine imaging showed osteophytic  changes C5-C6, C6-C7.  PMH + for pituitary tumor, COPD, CHF, smoking. Pt failed Yale swallow screen.      SLP Plan  Continue with current plan of care       Recommendations  Liquids provided via: No straw;Teaspoon Medication Administration: Crushed with puree Supervision: Full supervision/cueing for compensatory strategies;Staff to assist with self feeding Compensations: Minimize environmental distractions;Slow rate;Small sips/bites;Follow solids with liquid;Other (Comment) (only feed pt when she is fully alert and not dyspneic) Postural Changes and/or Swallow Maneuvers: Seated upright 90 degrees;Upright 30-60 min after meal (oral suction after meal)                Oral Care Recommendations: Other (Comment) (oral care after meal - oral suction - assure to clear pocketing on the right lateral sulci) SLP Visit Diagnosis: Dysphagia, oropharyngeal phase (R13.12);Aphasia (R47.01) Plan: Continue with current plan of care       GO                Angelica Strickland 10/29/2020, 10:35 AM   Angelica Lime, MS [facility] SLP Acute Rehab Services Office [phone] Pager [phone]

## 2020-10-29 NOTE — NC FL2 (Signed)
[place] MEDICAID FL2 LEVEL OF CARE SCREENING TOOL     IDENTIFICATION  Patient Name: Angelica Strickland Birthdate: September 05, 1955 Sex: female Admission Date (Current Location): 10/17/2020  [facility] and [place] Number:  Engineering geologist and Address:  [facility] [place] [address] [place], [place] [address]      Provider Number: [id number]  Attending Physician Name and Address:  Flora Lipps, MD  Relative Name and Phone Number:       Current Level of Care: Hospital Recommended Level of Care: [place] Prior Approval Number:    Date Approved/Denied:   PASRR Number: [id number]  Discharge Plan: SNF    Current Diagnoses: Patient Active Problem List   Diagnosis Date Noted  . Acute ischemic stroke ([place]) 10/18/2020  . Chronic systolic CHF (congestive heart failure) ([place]) 10/18/2020  . History of smoking 30 or more pack years 04/27/2016  . Body mass index (BMI) of 26.0-26.9 in adult 04/10/2015  . CCF (congestive cardiac failure) ([place]) 04/10/2015  . H/O neoplasm 04/10/2015  . Atrial fibrillation ([place]) 11/11/2014  . Depression 11/11/2014  . Essential hypertension 11/11/2014  . COPD (chronic obstructive pulmonary disease) ([place]) 11/11/2014  . Osteoarthritis 11/11/2014  . Thrombocytopenia ([place]) 11/11/2014  . Carpal tunnel syndrome 11/11/2014  . History of pituitary tumor 11/11/2014  . Abnormal CAT scan 01/07/2012  . Cardiomyopathy ([place]) 01/07/2012  . 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 History of cardiac catheterization 01/07/2012    Orientation RESPIRATION BLADDER Height & Weight     Self,Situation,Place  Normal Incontinent Weight: 216 lb 4.3 oz (98.1 kg) Height:     BEHAVIORAL SYMPTOMS/MOOD NEUROLOGICAL BOWEL NUTRITION STATUS      Incontinent Diet (see DC summary)  AMBULATORY STATUS COMMUNICATION OF NEEDS Skin   Extensive Assist Verbally Normal                       Personal Care Assistance Level of Assistance   Bathing,Feeding,Dressing Bathing Assistance: Maximum assistance Feeding assistance: Maximum assistance Dressing Assistance: Maximum assistance     Functional Limitations Info  Speech     Speech Info: Impaired (dysarthria)    SPECIAL CARE FACTORS FREQUENCY  PT (By licensed PT),OT (By licensed OT),Speech therapy     PT Frequency: 5x/wk OT Frequency: 5x/wk     Speech Therapy Frequency: 5x/wk      Contractures Contractures Info: Not present    Additional Factors Info  Code Status,Allergies Code Status Info: Full Allergies Info: Coumadin  (Warfarin), Clonidine Derivatives           Current Medications (10/29/2020):  This is the current hospital active medication list Current Facility-Administered Medications  Medication Dose Route Frequency Provider Last Rate Last Admin  .  stroke: mapping our early stages of recovery book   Does not apply Once Howerter, Justin B, DO      . acetaminophen (TYLENOL) tablet 650 mg  650 mg Oral Q4H PRN Vernelle Emerald, MD   650 mg at 10/29/20 0908  . albuterol (PROVENTIL) (2.5 MG/3ML) 0.083% nebulizer solution 2.5 mg  2.5 mg Nebulization Q4H PRN Howerter, Justin B, DO   2.5 mg at 10/29/20 0433  . apixaban (ELIQUIS) tablet 5 mg  5 mg Oral BID Henri Medal, RPH   5 mg at 10/29/20 5035  . atorvastatin (LIPITOR) tablet 40 mg  40 mg Oral Daily Cristal Ford, DO   40 mg at 10/29/20 0908  . carvedilol (COREG) tablet 12.5 mg  12.5  mg Oral BID WC Pokhrel, Laxman, MD   12.5 mg at 10/28/20 1723  . Chlorhexidine Gluconate Cloth 2 % PADS 6 each  6 each Topical Daily Cristal Ford, DO   6 each at 10/28/20 1005  . food thickener (SIMPLYTHICK (NECTAR/LEVEL 2/MILDLY THICK)) 25 packet  25 packet Oral PRN Rosalin Hawking, MD      . furosemide (LASIX) injection 40 mg  40 mg Intravenous BID Pokhrel, Laxman, MD      . LORazepam (ATIVAN) tablet 0.5 mg  0.5 mg Oral Q4H PRN Mansy, Jan A, MD   0.5 mg at 10/28/20 2236  . MEDLINE mouth rinse  15 mL Mouth Rinse BID  Cristal Ford, DO   15 mL at 10/29/20 4696  . methylphenidate (RITALIN) tablet 5 mg  5 mg Oral BID WC Pokhrel, Laxman, MD   5 mg at 10/29/20 0908     Discharge Medications: Please see discharge summary for a list of discharge medications.  Relevant Imaging Results:  Relevant Lab Results:   Additional Information SS#: [id number]  Geralynn Ochs, LCSW

## 2020-10-29 NOTE — Progress Notes (Signed)
Inpatient Rehabilitation Admissions Coordinator  We will sign off at this time.  Danne Baxter, RN, MSN Rehab Admissions Coordinator [phone] 10/29/2020 3:44 PM

## 2020-10-30 NOTE — Progress Notes (Signed)
Speech Language Pathology Treatment: Dysphagia;Cognitive-Linquistic  Patient Details Name: Angelica Strickland MRN: [id number] DOB: 03-13-1956 Today's Date: 10/30/2020 Time: 5284-1324 SLP Time Calculation (min) (ACUTE ONLY): 31 min  Assessment / Plan / Recommendation Clinical Impression  Pt alert in bed with partially consumed tray at bedside.  Respiratory status clinically improved today with only minimal expiratory wheeze and increased WOB with effort compared to yesterday.  SLP noted CXR results - Appreciate CXR being ordered for pt given her dysphagia and aphasia.    Pt drooling on right lateral sulci - and with max visual/tactile cues she brushed her teeth and used oral suction to clear.    SLP skilled SLP for intervention to determine readiness for dietary advancement, modification and language treatment.  Pt consumed graham cracker dipped in pudding, moisture, magic cup via spoon, nectar liquids via tsp, cup, straw and single ice chips.  Mastication and swallow of ice chip was [facility]  - suspect more efficient due to improved thermal input. Pt benefited from total cues to place bolus on left side of oral cavity and to masticate as rapidly as able.  Timed pt to help with her comprehension of task due to her aphasia - with her requiring 25 seconds x1 and 42 - 44 and 47 seconds with other solid boluses.  SLP facilitated oral transiting of masticated bolus with magic cup and nectar liquid bolus.  Oral holding noted x2 with pt simultaneously closing her eyes and exhaling = ? Due to fatigue.    SLP placed oral suction in pt's hand to help direct her to clear oral retention with total tactile assist to access right sulci.    After approximately 10 boluses over approx 10 minutes, pt stated "That's enough" - functional expressive communication!    Pt with delay in following one step directions but participative -total visual cues, hand over hand assist required -Functional tasks of brushing dentition,etc was  followed.  Suspect pt with frustration with language deficits given sighing noted x2.  Pt is making significant progress re: swallowing function and assisted with self care.     DG CHEST PORT 1 VIEW  Result Date: 10/29/2020 CLINICAL DATA:  Shortness of breath. EXAM: PORTABLE CHEST 1 VIEW COMPARISON:  Chest x-ray 03/01/2016. FINDINGS: Cardiomegaly again noted. Mild bilateral pulmonary infiltrates/edema and small right pleural effusion noted on today's exam. No pneumothorax. IMPRESSION: Cardiomegaly again noted. Mild bilateral pulmonary infiltrates/edema and small right pleural effusion noted on today's exam. Electronically Signed   By: Marcello Moores  Register   On: 10/29/2020 12:31      HPI HPI: Pt is a 65 yo adm to [facility] after recent fall, new onset of right side weakness, facial asymmetery and aphasia.  Imaging of brain showed moderately large acute to subacute CVA due to Left M1 occlusion.  Pt also with remote bilateral cerebellar cvas.  Cervical spine imaging showed osteophytic changes C5-C6, C6-C7.  PMH + for pituitary tumor, COPD, CHF, smoking. Pt failed [facility] swallow screen.      SLP Plan  Continue with current plan of care       Recommendations  Diet recommendations: Dysphagia 1 (puree);Nectar-thick liquid Liquids provided via: Cup;Teaspoon;Straw Medication Administration: Crushed with puree Supervision: Full supervision/cueing for compensatory strategies;Staff to assist with self feeding Compensations: Minimize environmental distractions;Slow rate;Small sips/bites;Follow solids with liquid;Other (Comment);Lingual sweep for clearance of pocketing Postural Changes and/or Swallow Maneuvers: Seated upright 90 degrees;Upright 30-60 min after meal                General  recommendations: Rehab consult Oral Care Recommendations: Other (Comment) (oral suction before, during and after meal) SLP Visit Diagnosis: Dysphagia, oropharyngeal phase (R13.12);Aphasia (R47.01) Plan: Continue with current  plan of care       GO                [name] 10/30/2020, 9:45 AM Kathleen Lime, MS [facility] SLP Acute Rehab Services Office [phone] Pager [phone]

## 2020-10-30 NOTE — Progress Notes (Signed)
Physical Therapy Treatment Patient Details Name: Angelica Strickland MRN: [id number] DOB: May 15, 1956 Today's Date: 10/30/2020    History of Present Illness 65 y/o female presented to ED on 5/27 after being found down with R facial droop, R arm and leg flaccid, and L gaze preference. CT head and MRI found L MCA infarct. PMH: CHF, Afib, HTN, COPD, depression    PT Comments    Session limited by soiled linens and lack of +2 assistance for end of session. Patient required maxA+2 to transition to/from EOB. Patient with posterior lean requiring min-maxA to maintain static sitting balance. Patient required maxA+2 for rolling L/R to perform pericare and change linens. Continue to recommend SNF for ongoing Physical Therapy.      Follow Up Recommendations  SNF;Supervision/Assistance - 24 hour     Equipment Recommendations  Other (comment) (TBD)    Recommendations for Other Services       Precautions / Restrictions Precautions Precautions: Fall;Other (comment) Precaution Comments: aphasia Restrictions Weight Bearing Restrictions: No    Mobility  Bed Mobility Overal bed mobility: Needs Assistance Bed Mobility: Supine to Sit;Sit to Supine;Rolling Rolling: Max assist;+2 for physical assistance   Supine to sit: Max assist;+2 for safety/equipment;+2 for physical assistance Sit to supine: Max assist;+2 for physical assistance;+2 for safety/equipment   General bed mobility comments: maxA+2 to transition to EOB. Able to initiate L LE movement towads EOB. Assist needed for L side advancement and trunk elevation.    Transfers                 General transfer comment: deferred. Returned to supine for pericare and unable after clean up due to lack of +2 assistance  Ambulation/Gait                 Stairs             Wheelchair Mobility    Modified Rankin (Stroke Patients Only) Modified Rankin (Stroke Patients Only) Pre-Morbid Rankin Score: No symptoms Modified Rankin:  Severe disability     Balance Overall balance assessment: Needs assistance Sitting-balance support: Single extremity supported;Feet supported Sitting balance-Leahy Scale: Poor Sitting balance - Comments: sitting balance ranging from min-maxA+1 Postural control: Right lateral lean;Posterior lean                                  Cognition Arousal/Alertness: Awake/alert Behavior During Therapy: Flat affect Overall Cognitive Status: Difficult to assess Area of Impairment: Following commands                       Following Commands: Follows one step commands with increased time       General Comments: Following ~50% of commands this session. Stating "get away" and "shit" at beginning of session.      Exercises      General Comments        Pertinent Vitals/Pain Pain Assessment: Faces Faces Pain Scale: No hurt Pain Intervention(s): Monitored during session    Home Living                      Prior Function            PT Goals (current goals can now be found in the care plan section) Acute Rehab PT Goals Patient Stated Goal: unable to state PT Goal Formulation: Patient unable to participate in goal setting Time For Goal Achievement: 11/01/20 Potential to Achieve  Goals: Fair Progress towards PT goals: Progressing toward goals    Frequency    Min 3X/week      PT Plan Current plan remains appropriate    Co-evaluation              AM-PAC PT "6 Clicks" Mobility   Outcome Measure  Help needed turning from your back to your side while in a flat bed without using bedrails?: Total Help needed moving from lying on your back to sitting on the side of a flat bed without using bedrails?: Total Help needed moving to and from a bed to a chair (including a wheelchair)?: Total Help needed standing up from a chair using your arms (e.g., wheelchair or bedside chair)?: Total Help needed to walk in hospital room?: Total Help needed  climbing 3-5 steps with a railing? : Total 6 Click Score: 6    End of Session   Activity Tolerance: Patient tolerated treatment well Patient left: in bed;with call bell/phone within reach;with bed alarm set Nurse Communication: Mobility status PT Visit Diagnosis: Unsteadiness on feet (R26.81);Muscle weakness (generalized) (M62.81);Other abnormalities of gait and mobility (R26.89);Difficulty in walking, not elsewhere classified (R26.2);Other symptoms and signs involving the nervous system (U98.119)     Time: 1478-2956 PT Time Calculation (min) (ACUTE ONLY): 17 min  Charges:  $Therapeutic Activity: 8-22 mins                     Izen Petz A. Gilford Rile PT, DPT Acute Rehabilitation Services Pager [phone] Office [phone]    Linna Hoff 10/30/2020, 3:25 PM

## 2020-10-30 NOTE — Progress Notes (Signed)
PROGRESS NOTE    Angelica Strickland  AVW:[id number] DOB: [birth year] DOA: 10/17/2020 PCP: Birdie Sons, MD   Brief Narrative:   Angelica Strickland is a 65 y.o. female with past medical history of chronic systolic heart failure with latest left ventricular ejection fraction of 20%, paroxysmal atrial fibrillation not on anticoagulation, thrombocytopenia, hypertension, COPD presented to the hospital on 10/17/2020 with acute ischemic stroke with right-sided facial droop and right-sided weakness.  Patient did have expressive aphasia.  Patient was then admitted hospital for management of acute stroke.  Patient has been seen by rehabilitation team who recommended CIR.  During hospitalization, patient has been having dysphagia and is on a dysphagia 1 diet.  Had received IV fluids due to lack of oral intake.  Has been started back on Lasix for diuresis due to congestion.  Assessment & Plan   Acute ischemic CVA CT scan showing left MCA infarct with left M1 occlusion on the CT angiogram.  2D echocardiogram with EF of 20 to 25%.  Hemoglobin A1c of 5.3.  Continue aspirin and statin.  Continue dysphagia 1 diet with nectar thick liquids.  Patient has high risk of aspiration.  Continue Eliquis for anticoagulation.  So far hemoglobin is stable.  Latest hemoglobin of 11.4.  Cervical ligamentous injury -MRI showed some prevertebral fluid.  Neurosurgery was consulted.  Conservative treatment.  No need for cervical collar.  Essential hypertension Continue Coreg.  Blood pressure latest at 108/78  COPD Continue nebulizers inhalers, IV Lasix has been started.  We will continue to monitor closely.  Acute on chronic systolic heart failure 2D echocardiogram on 10/19/2020 showed an EF of less than 20%, with some pulmonary congestion..  Chest x-ray done on 10/29/2020 showed cardiomegaly with bilateral pulmonary infiltrates and edema with small pleural effusion..  Currently lisinopril and torsemide on hold.  On Coreg off IV  fluids.  IV Lasix 40 mg IV twice daily.  Monitor electrolytes.  Monitor BMP closely.  Paroxysmal atrial fibrillation with RVR  Improved.  CHA2DS2-VASc of 6.  On Coreg and Eliquis.  Currently rate controlled.  Thrombocytopenia Improved..  Latest platelet count of 167.  No indication of bleeding.  Chronic kidney disease, stage IIIa Stable.  Latest creatinine of 0.9.  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  No acute intervention needed at this time.  Deconditioning,debility status post stroke.  Physical therapy has seen and recommended CIR. on Ritalin for alertness which seems to be fluctuating.  DVT Prophylaxis Lovenox subcu  Code Status: Full code  Family Communication:   None today.    Disposition Plan:  Status is: Inpatient  Remains inpatient appropriate because:IV treatments appropriate due to intensity of illness or inability to take PO and Inpatient level of care appropriate due to severity of illness, awaiting for skilled nursing facility  Dispo: The patient is from: Home              Anticipated d/c is to:  skilled nursing facility likely in 1 to 2 days              Patient currently is not medically stable to d/c.   Difficult to place patient No   Consultants Neurology  Inpatient rehab  Procedures  2 D Echocardiogram  Antibiotics   Anti-infectives (From admission, onward)    None       Subjective:   Today patient was seen and examined at bedside.  Only answering "huh".  Denies pain.  Objective:  Vitals:   10/29/20 1952 10/30/20 0008 10/30/20 0444 10/30/20 0931  BP: 111/75 120/73 101/78 108/78  Pulse: 98 92 (!) 50 (!) 121  Resp: 20  20 20   Temp: 98.5 F (36.9 C) 97.9 F (36.6 C) 97.8 F (36.6 C) 97.7 F (36.5 C)  TempSrc: Axillary Axillary Axillary Oral  SpO2: 100% 100% 100% 95%  Weight:        Intake/Output Summary (Last 24 hours) at 10/30/2020 1137 Last data filed at 10/29/2020  2359 Gross per 24 hour  Intake 120 ml  Output --  Net 120 ml    Filed Weights   10/23/20 0500 10/25/20 0500 10/26/20 0410  Weight: 88.3 kg 95.4 kg 98.1 kg    Physical exam General: Obese built, not in obvious distress HENT:   No scleral pallor or icterus noted. Oral mucosa is moist.  Chest: Coarse breath sounds noted.  Diminished breath sounds bilaterally CVS: S1 &S2 heard.  Irregular rhythm, systolic murmur noted Abdomen: Soft, nontender, nondistended.  Bowel sounds are heard.   Extremities: No cyanosis, clubbing or edema.  Peripheral pulses are palpable. Psych: Alert, awake and oriented, normal mood CNS:  No cranial nerve deficits.  Facial and right-sided weakness. Skin: Warm and dry.  No rashes noted.   Data Reviewed: I have personally reviewed the following lab and imaging studies   CBC: Recent Labs  Lab 10/24/20 0123 10/26/20 0149 10/28/20 0515 10/29/20 0451  WBC 6.7 7.5 6.5 6.9  HGB 11.2* 11.6* 10.8* 11.4*  HCT 35.2* 35.4* 33.9* 35.9*  MCV 101.1* 101.7* 102.7* 102.3*  PLT 121* 151 163 154    Basic Metabolic Panel: Recent Labs  Lab 10/24/20 0123 10/26/20 0149 10/28/20 0515 10/29/20 0451  NA 139 135 138 141  K 4.9 5.4* 3.8 3.6  CL 116* 113* 111 107  CO2 17* 15* 21* 25  GLUCOSE 109* 118* 102* 108*  BUN 28* 38* 32* 30*  CREATININE 1.00 0.99 0.98 0.98  CALCIUM 8.6* 8.6* 8.7* 8.7*  MG 2.2 2.3 2.0 2.0  PHOS 4.2  --  3.6 3.6    GFR: CrCl cannot be calculated (Unknown ideal weight.). Liver Function Tests: Recent Labs  Lab 10/29/20 0451  AST 59*  ALT 206*  ALKPHOS 86  BILITOT 0.9  PROT 6.2*  ALBUMIN 3.3*    No results for input(s): LIPASE, AMYLASE in the last 168 hours. No results for input(s): AMMONIA in the last 168 hours. Coagulation Profile: No results for input(s): INR, PROTIME in the last 168 hours. Cardiac Enzymes: No results for input(s): CKTOTAL, CKMB, CKMBINDEX, TROPONINI in the last 168 hours. BNP (last 3 results) No results for  input(s): PROBNP in the last 8760 hours. HbA1C: No results for input(s): HGBA1C in the last 72 hours. CBG: No results for input(s): GLUCAP in the last 168 hours. Lipid Profile: No results for input(s): CHOL, HDL, LDLCALC, TRIG, CHOLHDL, LDLDIRECT in the last 72 hours. Thyroid Function Tests: No results for input(s): TSH, T4TOTAL, FREET4, T3FREE, THYROIDAB in the last 72 hours. Anemia Panel: No results for input(s): VITAMINB12, FOLATE, FERRITIN, TIBC, IRON, RETICCTPCT in the last 72 hours. Urine analysis:    Component Value Date/Time   COLORURINE YELLOW 10/17/2020 2324   APPEARANCEUR CLEAR 10/17/2020 2324   APPEARANCEUR Clear 10/11/2013 1747   LABSPEC 1.015 10/17/2020 2324   LABSPEC 1.008 10/11/2013 1747   PHURINE 5.0 10/17/2020 2324   GLUCOSEU NEGATIVE 10/17/2020 2324   GLUCOSEU Negative 10/11/2013 1747   HGBUR NEGATIVE 10/17/2020 [place] 10/17/2020 2324  BILIRUBINUR Negative 10/11/2013 [place] 10/17/2020 2324   PROTEINUR NEGATIVE 10/17/2020 2324   NITRITE NEGATIVE 10/17/2020 2324   LEUKOCYTESUR NEGATIVE 10/17/2020 2324   LEUKOCYTESUR 2+ 10/11/2013 1747   Sepsis Labs: @LABRCNTIP (procalcitonin:4,lacticidven:4)  ) No results found for this or any previous visit (from the past 240 hour(s)).    Radiology Studies: DG CHEST PORT 1 VIEW  Result Date: 10/29/2020 CLINICAL DATA:  Shortness of breath. EXAM: PORTABLE CHEST 1 VIEW COMPARISON:  Chest x-ray 03/01/2016. FINDINGS: Cardiomegaly again noted. Mild bilateral pulmonary infiltrates/edema and small right pleural effusion noted on today's exam. No pneumothorax. IMPRESSION: Cardiomegaly again noted. Mild bilateral pulmonary infiltrates/edema and small right pleural effusion noted on today's exam. Electronically Signed   By: Marcello Moores  Register   On: 10/29/2020 12:31     Scheduled Meds:   stroke: mapping our early stages of recovery book   Does not apply Once   apixaban  5 mg Oral BID    atorvastatin  40 mg Oral Daily   carvedilol  12.5 mg Oral BID WC   Chlorhexidine Gluconate Cloth  6 each Topical Daily   furosemide  40 mg Intravenous BID   mouth rinse  15 mL Mouth Rinse BID   methylphenidate  5 mg Oral BID WC   Continuous Infusions:    LOS: 12 days    Dorethy Tomey MD  10/30/2020 at 11:37 AM   [facility] Office  [phone]

## 2020-10-31 LAB — COMPREHENSIVE METABOLIC PANEL
ALT: 124 U/L — ABNORMAL HIGH (ref 0–44)
AST: 35 U/L (ref 15–41)
Albumin: 3.3 g/dL — ABNORMAL LOW (ref 3.5–5.0)
Alkaline Phosphatase: 83 U/L (ref 38–126)
Anion gap: 10 (ref 5–15)
BUN: 32 mg/dL — ABNORMAL HIGH (ref 8–23)
CO2: 27 mmol/L (ref 22–32)
Calcium: 9.1 mg/dL (ref 8.9–10.3)
Chloride: 106 mmol/L (ref 98–111)
Creatinine, Ser: 1.04 mg/dL — ABNORMAL HIGH (ref 0.44–1.00)
GFR, Estimated: 59 mL/min — ABNORMAL LOW (ref >60)
Glucose, Bld: 113 mg/dL — ABNORMAL HIGH (ref 70–99)
Potassium: 3.7 mmol/L (ref 3.5–5.1)
Sodium: 143 mmol/L (ref 135–145)
Total Bilirubin: 1 mg/dL (ref 0.3–1.2)
Total Protein: 6.3 g/dL — ABNORMAL LOW (ref 6.5–8.1)

## 2020-10-31 LAB — CBC
HCT: 36 % (ref 36.0–46.0)
Hemoglobin: 11.6 g/dL — ABNORMAL LOW (ref 12.0–15.0)
MCH: 32.7 pg (ref 26.0–34.0)
MCHC: 32.2 g/dL (ref 30.0–36.0)
MCV: 101.4 fL — ABNORMAL HIGH (ref 80.0–100.0)
Platelets: 188 10*3/uL (ref 150–400)
RBC: 3.55 MIL/uL — ABNORMAL LOW (ref 3.87–5.11)
RDW: 15.2 % (ref 11.5–15.5)
WBC: 8 10*3/uL (ref 4.0–10.5)
nRBC: 1 % — ABNORMAL HIGH (ref 0.0–0.2)

## 2020-10-31 LAB — RESP PANEL BY RT-PCR (FLU A&B, COVID) ARPGX2
Influenza A by PCR: NEGATIVE
Influenza B by PCR: NEGATIVE
SARS Coronavirus 2 by RT PCR: NEGATIVE

## 2020-10-31 LAB — PHOSPHORUS: Phosphorus: 3.9 mg/dL (ref 2.5–4.6)

## 2020-10-31 LAB — MAGNESIUM: Magnesium: 2.1 mg/dL (ref 1.7–2.4)

## 2020-10-31 MED ORDER — SPIRONOLACTONE 25 MG PO TABS: 25.0000 mg | ORAL_TABLET | Freq: Every day | ORAL | Status: AC

## 2020-10-31 MED ORDER — ATORVASTATIN CALCIUM 40 MG PO TABS: 40.0000 mg | ORAL_TABLET | Freq: Every day | ORAL | 2 refills | Status: AC

## 2020-10-31 MED ORDER — LISINOPRIL 2.5 MG PO TABS: 2.5000 mg | ORAL_TABLET | Freq: Every day | ORAL | Status: AC

## 2020-10-31 MED ORDER — METHYLPHENIDATE HCL 5 MG PO TABS: 5.0000 mg | ORAL_TABLET | Freq: Two times a day (BID) | ORAL | 0 refills | Status: AC

## 2020-10-31 MED ORDER — TORSEMIDE 20 MG PO TABS: 40.0000 mg | ORAL_TABLET | Freq: Every day | ORAL | Status: AC

## 2020-10-31 MED ORDER — FUROSEMIDE 10 MG/ML IJ SOLN: 40.0000 mg | Freq: Every day | INTRAMUSCULAR | Status: DC

## 2020-10-31 MED ORDER — CARVEDILOL 12.5 MG PO TABS: 12.5000 mg | ORAL_TABLET | Freq: Two times a day (BID) | ORAL | Status: AC

## 2020-10-31 MED ORDER — APIXABAN 5 MG PO TABS: 5.0000 mg | ORAL_TABLET | Freq: Two times a day (BID) | ORAL | 2 refills | Status: AC

## 2020-10-31 NOTE — Progress Notes (Signed)
PROGRESS NOTE    Angelica Strickland  [id number]:[id number] DOB: Oct 26, 1955 DOA: 10/17/2020 PCP: Birdie Sons, MD   Brief Narrative:   Angelica Strickland is a 65 y.o. female with past medical history of chronic systolic heart failure with latest left ventricular ejection fraction of 20%, paroxysmal atrial fibrillation not on anticoagulation, thrombocytopenia, hypertension, COPD presented to the hospital on 10/17/2020 with acute ischemic stroke with right-sided facial droop and right-sided weakness.  Patient did have expressive aphasia.  Patient was then admitted hospital for management of acute stroke.  Patient has been seen by rehabilitation team who recommended CIR.  During hospitalization, patient has been having dysphagia and is on a dysphagia 1 diet.  Had received IV fluids due to lack of oral intake.  Has been started back on Lasix for diuresis due to congestion.  Assessment & Plan   Acute ischemic CVA CT scan showing left MCA infarct with left M1 occlusion on the CT angiogram.  2D echocardiogram with EF of 20 to 25%.  Hemoglobin A1c of 5.3.  Continue aspirin and statin.  Continue dysphagia 1 diet with nectar thick liquids.  Patient has high risk of aspiration.  Continue Eliquis for anticoagulation.  So far hemoglobin is stable.  Latest hemoglobin of 11.6  Cervical ligamentous injury -MRI showed some prevertebral fluid.  Neurosurgery was consulted.  Conservative treatment.  No need for cervical collar.  Essential hypertension On Coreg.  Watch for holding parameters.  COPD Continue nebulizers inhalers, IV Lasix has been started.  We will continue to monitor closely.  Acute on chronic systolic heart failure 2D echocardiogram on 10/19/2020 showed an EF of less than 20%, with some pulmonary congestion..  Chest x-ray done on 10/29/2020 showed cardiomegaly with bilateral pulmonary infiltrates and edema with small pleural effusion..  Currently lisinopril and torsemide on hold.  On Coreg off IV fluids.  IV  Lasix 40 mg IV twice daily.  Monitor electrolytes.  Monitor BMP closely.  We will continue to trend electrolytes.  Will decrease Lasix to 40 daily  Paroxysmal atrial fibrillation with RVR  Improved.  CHA2DS2-VASc of 6.  On Coreg and Eliquis.  Currently rate controlled.  Thrombocytopenia Improved..  Latest platelet count of 188.  No indication of bleeding.  Chronic kidney disease, stage IIIa Stable.  Latest creatinine of 0.9.  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  No acute intervention needed at this time.  Deconditioning,debility status post stroke.  Physical therapy has seen and recommended CIR. on Ritalin for alertness   DVT Prophylaxis Lovenox subcu  Code Status: Full code  Family Communication:   Spoke with the patient's sister on 10/31/2020 and updated her about the clinical condition of the patient.  Disposition Plan:  Status is: Inpatient  Remains inpatient appropriate because:IV treatments appropriate due to intensity of illness or inability to take PO and Inpatient level of care appropriate due to severity of illness, awaiting for skilled nursing facility  Dispo: The patient is from: Home              Anticipated d/c is to:  skilled nursing facility likely in 1 to 2 days              Patient currently is medically stable to d/c.   Difficult to place patient No   Consultants Neurology  Inpatient rehab  Procedures  2 D Echocardiogram  Antibiotics   Anti-infectives (From admission, onward)    None       Subjective:  Today, patient was seen and examined at bedside.  Only answering "huh"  Denies any pain, nausea, vomiting, fever or chills.     Objective:   Vitals:   10/30/20 2016 10/30/20 2353 10/31/20 0452 10/31/20 0815  BP: 108/68 (!) 113/91 112/86 121/90  Pulse: (!) 103 (!) 106 (!) 104 97  Resp: 20 20  20   Temp: (!) 97.5 F (36.4 C) (!) 97.4 F (36.3 C) 97.8 F (36.6 C) 97.8 F (36.6 C)   TempSrc: Oral Axillary Axillary Oral  SpO2: 99% 100% 97% 100%  Weight:        Intake/Output Summary (Last 24 hours) at 10/31/2020 1137 Last data filed at 10/31/2020 0109 Gross per 24 hour  Intake 220 ml  Output 250 ml  Net -30 ml    Filed Weights   10/23/20 0500 10/25/20 0500 10/26/20 0410  Weight: 88.3 kg 95.4 kg 98.1 kg    Physical exam General: Obese built, not in obvious distress HENT:   No scleral pallor or icterus noted. Oral mucosa is moist.  Chest: Coarse breath sounds noted.  Diminished breath sounds bilaterally CVS: S1 &S2 heard.  Irregular rhythm. Abdomen: Soft, nontender, nondistended.  Bowel sounds are heard.   Extremities: No cyanosis, clubbing or edema.  Peripheral pulses are palpable. Psych: Alert, awake and responsive to verbal commands, speech indistinct, CNS:  No cranial nerve deficits.  Facial and right-sided weakness. Skin: Warm and dry.  No rashes noted.  Data Reviewed: I have personally reviewed the following lab and imaging studies   CBC: Recent Labs  Lab 10/26/20 0149 10/28/20 0515 10/29/20 0451 10/31/20 0428  WBC 7.5 6.5 6.9 8.0  HGB 11.6* 10.8* 11.4* 11.6*  HCT 35.4* 33.9* 35.9* 36.0  MCV 101.7* 102.7* 102.3* 101.4*  PLT 151 163 167 629    Basic Metabolic Panel: Recent Labs  Lab 10/26/20 0149 10/28/20 0515 10/29/20 0451 10/31/20 0428  NA 135 138 141 143  K 5.4* 3.8 3.6 3.7  CL 113* 111 107 106  CO2 15* 21* 25 27  GLUCOSE 118* 102* 108* 113*  BUN 38* 32* 30* 32*  CREATININE 0.99 0.98 0.98 1.04*  CALCIUM 8.6* 8.7* 8.7* 9.1  MG 2.3 2.0 2.0 2.1  PHOS  --  3.6 3.6 3.9    GFR: CrCl cannot be calculated (Unknown ideal weight.). Liver Function Tests: Recent Labs  Lab 10/29/20 0451 10/31/20 0428  AST 59* 35  ALT 206* 124*  ALKPHOS 86 83  BILITOT 0.9 1.0  PROT 6.2* 6.3*  ALBUMIN 3.3* 3.3*    No results for input(s): LIPASE, AMYLASE in the last 168 hours. No results for input(s): AMMONIA in the last 168  hours. Coagulation Profile: No results for input(s): INR, PROTIME in the last 168 hours. Cardiac Enzymes: No results for input(s): CKTOTAL, CKMB, CKMBINDEX, TROPONINI in the last 168 hours. BNP (last 3 results) No results for input(s): PROBNP in the last 8760 hours. HbA1C: No results for input(s): HGBA1C in the last 72 hours. CBG: No results for input(s): GLUCAP in the last 168 hours. Lipid Profile: No results for input(s): CHOL, HDL, LDLCALC, TRIG, CHOLHDL, LDLDIRECT in the last 72 hours. Thyroid Function Tests: No results for input(s): TSH, T4TOTAL, FREET4, T3FREE, THYROIDAB in the last 72 hours. Anemia Panel: No results for input(s): VITAMINB12, FOLATE, FERRITIN, TIBC, IRON, RETICCTPCT in the last 72 hours. Urine analysis:    Component Value Date/Time   COLORURINE YELLOW 10/17/2020 2324   APPEARANCEUR CLEAR 10/17/2020 2324   APPEARANCEUR Clear 10/11/2013 1747  LABSPEC 1.015 10/17/2020 2324   LABSPEC 1.008 10/11/2013 1747   PHURINE 5.0 10/17/2020 2324   GLUCOSEU NEGATIVE 10/17/2020 2324   GLUCOSEU Negative 10/11/2013 1747   HGBUR NEGATIVE 10/17/2020 2324   BILIRUBINUR NEGATIVE 10/17/2020 2324   BILIRUBINUR Negative 10/11/2013 1747   KETONESUR NEGATIVE 10/17/2020 2324   PROTEINUR NEGATIVE 10/17/2020 2324   NITRITE NEGATIVE 10/17/2020 2324   LEUKOCYTESUR NEGATIVE 10/17/2020 2324   LEUKOCYTESUR 2+ 10/11/2013 1747   Sepsis Labs: @LABRCNTIP (procalcitonin:4,lacticidven:4)  ) No results found for this or any previous visit (from the past 240 hour(s)).    Radiology Studies: No results found.   Scheduled Meds:   stroke: mapping our early stages of recovery book   Does not apply Once   apixaban  5 mg Oral BID   atorvastatin  40 mg Oral Daily   carvedilol  12.5 mg Oral BID WC   Chlorhexidine Gluconate Cloth  6 each Topical Daily   furosemide  40 mg Intravenous BID   mouth rinse  15 mL Mouth Rinse BID   methylphenidate  5 mg Oral BID WC   Continuous Infusions:     LOS: 13 days    Angelica Bralley MD  10/31/2020 at 11:37 AM   [facility] Office  [phone]

## 2020-10-31 NOTE — Progress Notes (Signed)
Physical Therapy Treatment Patient Details Name: Angelica Strickland MRN: [id number] DOB: 07/03/1955 Today's Date: 10/31/2020    History of Present Illness 65 y/o female presented to ED on 5/27 after being found down with R facial droop, R arm and leg flaccid, and L gaze preference. CT head and MRI found L MCA infarct. PMH: CHF, Afib, HTN, COPD, depression    PT Comments    Patient more interactive and alert this session. Patient able to perform sit to stand with maxA+2 and HHAx2. Patient following ~50% of commands accurately. Patient required min guard-minA to maintain static sitting balance. Patient limited by aphasia, impaired balance, generalized weakness, decreased activity tolerance, impaired functional mobility, and impaired cognition. Continue to recommend SNF for ongoing Physical Therapy.        Follow Up Recommendations  SNF;Supervision/Assistance - 24 hour     Equipment Recommendations  Other (comment) (TBD)    Recommendations for Other Services       Precautions / Restrictions Precautions Precautions: Fall;Other (comment) Precaution Comments: aphasia Restrictions Weight Bearing Restrictions: No    Mobility  Bed Mobility Overal bed mobility: Needs Assistance Bed Mobility: Supine to Sit;Sit to Supine;Rolling Rolling: Max assist;+2 for physical assistance   Supine to sit: Max assist;+2 for safety/equipment;+2 for physical assistance Sit to supine: Max assist;+2 for physical assistance;+2 for safety/equipment   General bed mobility comments: Pt requiring max A +2 for RLE and trunk to transition to EOB. Pt able to initiate LLE movement toward EOB.    Transfers Overall transfer level: Needs assistance Equipment used: 2 person hand held assist Transfers: Sit to/from Stand Sit to Stand: Max assist;+2 physical assistance         General transfer comment: requiring Max A +2 due to decreased strength, balance, and command following.  Ambulation/Gait                  Stairs             Wheelchair Mobility    Modified Rankin (Stroke Patients Only) Modified Rankin (Stroke Patients Only) Pre-Morbid Rankin Score: No symptoms Modified Rankin: Severe disability     Balance Overall balance assessment: Needs assistance Sitting-balance support: Single extremity supported;Feet supported Sitting balance-Leahy Scale: Poor Sitting balance - Comments: sitting balance ranging from min guard A-modA+1 Postural control: Posterior lean Standing balance support: Bilateral upper extremity supported;During functional activity Standing balance-Leahy Scale: Poor Standing balance comment: Pt standing with Max A +2                            Cognition Arousal/Alertness: Awake/alert Behavior During Therapy: Flat affect Overall Cognitive Status: Impaired/Different from baseline Area of Impairment: Following commands;Attention                   Current Attention Level: Focused   Following Commands: Follows one step commands inconsistently;Follows one step commands with increased time       General Comments: Pt requiring max verbal cues to follow commands with increased time. Pt washing hands with warm wash cloth when asked to wash face. Pt with attempts to verbalize but difficulty expressing throughout. Pt responding well with increased alertness and eye contact with gospel music.      Exercises Shoulder Exercises Shoulder Flexion: PROM;10 reps Hand Exercises Wrist Flexion: PROM;10 reps Wrist Extension: PROM;10 reps Digit Composite Flexion: PROM;10 reps Composite Extension: PROM;10 reps Thumb Abduction: PROM;10 reps Other Exercises Other Exercises: PROM R knee extension/flexion and ankle DF/PF  General Comments General comments (skin integrity, edema, etc.): Pt HR elevating to 142BPM after standing. Stabilizing with seated rest break.      Pertinent Vitals/Pain Pain Assessment: Faces Faces Pain Scale: Hurts a little  bit Pain Location: generalized Pain Descriptors / Indicators: Grimacing Pain Intervention(s): Monitored during session    Home Living                      Prior Function            PT Goals (current goals can now be found in the care plan section) Acute Rehab PT Goals Patient Stated Goal: unable to state PT Goal Formulation: Patient unable to participate in goal setting Time For Goal Achievement: 11/01/20 Potential to Achieve Goals: Fair Progress towards PT goals: Progressing toward goals    Frequency    Min 3X/week      PT Plan Current plan remains appropriate    Co-evaluation PT/OT/SLP Co-Evaluation/Treatment: Yes Reason for Co-Treatment: For patient/therapist safety;To address functional/ADL transfers PT goals addressed during session: Mobility/safety with mobility;Balance OT goals addressed during session: ADL's and self-care      AM-PAC PT "6 Clicks" Mobility   Outcome Measure  Help needed turning from your back to your side while in a flat bed without using bedrails?: Total Help needed moving from lying on your back to sitting on the side of a flat bed without using bedrails?: Total Help needed moving to and from a bed to a chair (including a wheelchair)?: Total Help needed standing up from a chair using your arms (e.g., wheelchair or bedside chair)?: Total Help needed to walk in hospital room?: Total Help needed climbing 3-5 steps with a railing? : Total 6 Click Score: 6    End of Session Equipment Utilized During Treatment: Gait belt Activity Tolerance: Patient tolerated treatment well Patient left: in bed;with call bell/phone within reach;with bed alarm set Nurse Communication: Mobility status PT Visit Diagnosis: Unsteadiness on feet (R26.81);Muscle weakness (generalized) (M62.81);Other abnormalities of gait and mobility (R26.89);Difficulty in walking, not elsewhere classified (R26.2);Other symptoms and signs involving the nervous system  (Z61.096)     Time: 0454-0981 PT Time Calculation (min) (ACUTE ONLY): 25 min  Charges:  $Therapeutic Activity: 8-22 mins                     Keyvon Herter A. Gilford Rile PT, DPT Acute Rehabilitation Services Pager [phone] Office [phone]    Linna Hoff 10/31/2020, 5:39 PM

## 2020-10-31 NOTE — Progress Notes (Signed)
Occupational Therapy Treatment Patient Details Name: Angelica Strickland MRN: [id number] DOB: 08/10/1955 Today's Date: 10/31/2020    History of present illness 65 y/o female presented to ED on 5/27 after being found down with R facial droop, R arm and leg flaccid, and L gaze preference. CT head and MRI found L MCA infarct. PMH: CHF, Afib, HTN, COPD, depression   OT comments  Pt progressing toward OT goals. Pt completing bed mobility, sit/stand transfers with Max A +2. Pt initially requiring Max A for sitting balance and with increased alertness requiring Min Guard A-Min A. Pt with decreased ability to follow one step commands for grooming. Pt continuing to present with difficulty verbalizing, impaired balance, decreased activity tolerance, and R hemiplegia. PROM R shoulder flexion/extension, wrist flexion/extension, thumb abduction, and composite finger flexion x10 performed. Continue to recommend discharge to CIR and will continue to follow acutely to improve, strength, balance, cognition, and participation in ADLs.    Follow Up Recommendations  CIR    Equipment Recommendations  Other (comment) (TBD)    Recommendations for Other Services      Precautions / Restrictions Precautions Precautions: Fall;Other (comment) Precaution Comments: aphasia Restrictions Weight Bearing Restrictions: No       Mobility Bed Mobility Overal bed mobility: Needs Assistance Bed Mobility: Supine to Sit;Sit to Supine;Rolling Rolling: Max assist;+2 for physical assistance   Supine to sit: Max assist;+2 for safety/equipment;+2 for physical assistance Sit to supine: Max assist;+2 for physical assistance;+2 for safety/equipment   General bed mobility comments: Pt requiring max A +2 for RLE and trunk to transition to EOB. Pt able to initiate LLE movement toward EOB.    Transfers Overall transfer level: Needs assistance Equipment used: 2 person hand held assist Transfers: Sit to/from Stand Sit to Stand: Max  assist;+2 physical assistance         General transfer comment: requiring Max A +2 due to decreased strength, balance, and command following.    Balance Overall balance assessment: Needs assistance Sitting-balance support: Single extremity supported;Feet supported Sitting balance-Leahy Scale: Poor Sitting balance - Comments: sitting balance ranging from min-modA+1 Postural control: Posterior lean Standing balance support: Bilateral upper extremity supported;During functional activity Standing balance-Leahy Scale: Poor Standing balance comment: Pt standing with Max A +2                           ADL either performed or assessed with clinical judgement   ADL Overall ADL's : Needs assistance/impaired     Grooming: Wash/dry face;Cueing for sequencing;Sitting Grooming Details (indicate cue type and reason): Pt wiping eyes with paper towel when cued to wipe mouth x2. Pt given wash cloth and cued to wash face, but washing hands.                 Toilet Transfer: Maximal assistance;+2 for physical assistance (sitto/from stand)             General ADL Comments: pt continues to present with difficulty verbalizing, impaired balance, decreased activity tolerance, and R hemiplegia. Pt completing sit/stand transfer with Max A +2 in preparation for performing functional mobility     Vision   Vision Assessment?: Vision impaired- to be further tested in functional context Additional Comments: Pt with increased eye contact with therapist singing and playing music.   Perception     Praxis      Cognition Arousal/Alertness: Awake/alert Behavior During Therapy: Flat affect Overall Cognitive Status: Impaired/Different from baseline Area of Impairment: Following commands;Attention  Current Attention Level: Focused   Following Commands: Follows one step commands inconsistently;Follows one step commands with increased time       General  Comments: Pt requiring max verbal cues to follow commands with increased time. Pt washing hands with warm wash cloth when asked to wash face. Pt with attempts to verbalize but difficulty expressing throughout. Pt responding well with increased alertness and eye contact with gospel music.        Exercises Exercises: Hand exercises;Shoulder Shoulder Exercises Shoulder Flexion: PROM;10 reps Hand Exercises Wrist Flexion: PROM;10 reps Wrist Extension: PROM;10 reps Digit Composite Flexion: PROM;10 reps Composite Extension: PROM;10 reps Thumb Abduction: PROM;10 reps   Shoulder Instructions       General Comments Pt HR elevating to 142BPM after standing. Stabilizing with seated rest break.    Pertinent Vitals/ Pain       Pain Assessment: Faces Faces Pain Scale: Hurts a little bit Pain Location: generalized Pain Descriptors / Indicators: Grimacing Pain Intervention(s): Monitored during session;Repositioned  Home Living                                          Prior Functioning/Environment              Frequency  Min 2X/week        Progress Toward Goals  OT Goals(current goals can now be found in the care plan section)  Progress towards OT goals: Progressing toward goals  Acute Rehab OT Goals Patient Stated Goal: unable to state OT Goal Formulation: Patient unable to participate in goal setting Time For Goal Achievement: 11/01/20 ADL Goals Pt Will Perform Grooming: with max assist;with mod assist;sitting Additional ADL Goal #1: Pt will roll to R side using rail with L UE mod A for staff to assist with bathing/peri hygiene Additional ADL Goal #2: Pt will sit EOB x 5-10 minutes with with mod A for balance/support Additional ADL Goal #3: Pt will tolerate PROM of RUE in multiple planes while seated  Plan Discharge plan remains appropriate;Frequency remains appropriate    Co-evaluation    PT/OT/SLP Co-Evaluation/Treatment: Yes Reason for Co-Treatment:  For patient/therapist safety;To address functional/ADL transfers PT goals addressed during session: Mobility/safety with mobility OT goals addressed during session: ADL's and self-care      AM-PAC OT "6 Clicks" Daily Activity     Outcome Measure   Help from another person eating meals?: Total Help from another person taking care of personal grooming?: Total Help from another person toileting, which includes using toliet, bedpan, or urinal?: Total Help from another person bathing (including washing, rinsing, drying)?: Total Help from another person to put on and taking off regular upper body clothing?: Total Help from another person to put on and taking off regular lower body clothing?: Total 6 Click Score: 6    End of Session Equipment Utilized During Treatment: Gait belt  OT Visit Diagnosis: Other abnormalities of gait and mobility (R26.89);Muscle weakness (generalized) (M62.81);Cognitive communication deficit (R41.841);Other symptoms and signs involving cognitive function;Hemiplegia and hemiparesis Hemiplegia - Right/Left: Right Hemiplegia - caused by: Cerebral infarction   Activity Tolerance Patient tolerated treatment well   Patient Left in bed;with call bell/phone within reach;with bed alarm set   Nurse Communication Mobility status        Time: 6270-3500 OT Time Calculation (min): 20 min  Charges:    Shanda Howells, OTDS    Shanda Howells 10/31/2020, 5:05 PM

## 2020-10-31 NOTE — Plan of Care (Signed)
Max assit with adls, report called to [facility].

## 2020-10-31 NOTE — Discharge Summary (Signed)
Physician Discharge Summary  Angelica Strickland UTM:[id number] DOB: 27-Apr-1956 DOA: 10/17/2020  PCP: Birdie Sons, MD  Admit date: 10/17/2020 Discharge date: 10/31/2020  Admitted From: Home  Discharge disposition: Skilled nursing facility   Recommendations for Outpatient Follow-Up:   Follow up with your primary care provider at the skilled nursing facility in 3 to 5 days. Check CBC, BMP, phosphorus, magnesium in the next visit   Discharge Diagnosis:   Principal Problem:   Acute ischemic stroke [facility]) Active Problems:   Atrial fibrillation (HCC)   Essential hypertension   COPD (chronic obstructive pulmonary disease) (HCC)   Chronic systolic CHF (congestive heart failure) ([place])    Discharge Condition: Improved.  Diet recommendation: Low sodium, heart healthy.  Dysphagia 1 diet with nectar thick liquids  Wound care: None.  Code status: Full.   History of Present Illness:   Angelica Strickland is a 64 y.o. female with past medical history of chronic systolic heart failure with latest left ventricular ejection fraction of 20%, paroxysmal atrial fibrillation not on anticoagulation, thrombocytopenia, hypertension, COPD presented to the hospital on 10/17/2020 with acute ischemic stroke with right-sided facial droop and right-sided weakness.  Patient did have expressive aphasia.  Patient was then admitted hospital for management of acute stroke.  Patient has been seen by rehabilitation team who recommended CIR.    Hospital Course:   Following conditions were addressed during hospitalization as listed below,  Acute ischemic CVA CT scan showing left MCA infarct with left M1 occlusion on the CT angiogram.  2D echocardiogram with EF of 20 to 25%.  Hemoglobin A1c of 5.3.  Continue aspirin and statin.  Continue dysphagia 1 diet with nectar thick liquids.  Patient has high risk of aspiration.  Continue Eliquis for anticoagulation.  So far hemoglobin is stable.  Latest hemoglobin of 11.6    Cervical ligamentous injury -MRI showed some prevertebral fluid.  Neurosurgery was consulted.  Conservative treatment.  No need for cervical collar.   Essential hypertension On Coreg, lisinopril and spironolactone as outpatient.  Resume them with holding parameters.   COPD COPD seems to be compensated.  Diuretic has been resumed  chronic systolic heart failure 2D echocardiogram on 10/19/2020 showed an EF of less than 20%, with some pulmonary congestion..  Chest x-ray done on 10/29/2020 showed cardiomegaly with bilateral pulmonary infiltrates and edema with small pleural effusion..  She had IV Lasix during hospitalization with improvement in symptoms.  Will be resumed on torsemide outpatient regimen on discharge.   Paroxysmal atrial fibrillation with RVR Improved.  CHA2DS2-VASc of 6.  On Coreg and Eliquis will be continued on discharge.  Currently rate controlled.   Thrombocytopenia Improved..  Latest platelet count of 188.  No indication of bleeding.   Chronic kidney disease, stage IIIa Stable.  Latest creatinine of 1.04.   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  No acute intervention needed at this time.   Deconditioning,debility status post stroke.  Physical therapy has seen and recommended CIR. we will continue on Ritalin for alertness   Disposition.  At this time, patient is stable for disposition to skilled nursing facility.  Spoke with the patient's sister on the phone about disposition plan.  Medical Consultants:   Neurology  Procedures:    2D echocardiogram Subjective:   Today, patient was seen and examined at bedside.  Only answering "huh"  Denies any pain, nausea, vomiting, fever or chills.    Discharge Exam:   Vitals:   10/31/20  0815 10/31/20 1200  BP: 121/90 109/70  Pulse: 97 90  Resp: 20 20  Temp: 97.8 F (36.6 C) 98.3 F (36.8 C)  SpO2: 100% 94%   Vitals:   10/30/20 2353 10/31/20 0452 10/31/20  0815 10/31/20 1200  BP: (!) 113/91 112/86 121/90 109/70  Pulse: (!) 106 (!) 104 97 90  Resp: 20  20 20   Temp: (!) 97.4 F (36.3 C) 97.8 F (36.6 C) 97.8 F (36.6 C) 98.3 F (36.8 C)  TempSrc: Axillary Axillary Oral Oral  SpO2: 100% 97% 100% 94%  Weight:        General: Alert awake, not in obvious distress, obese built HENT: pupils equally reacting to light,  No scleral pallor or icterus noted. Oral mucosa is moist.  Chest:   Diminished breath sounds bilaterally.  Coarse breath sounds noted. CVS: S1 &S2 heard. No murmur.  Irregular rhythm. Abdomen: Soft, nontender, nondistended.  Bowel sounds are heard.   Extremities: No cyanosis, clubbing or edema.  Peripheral pulses are palpable. Psych: Alert, awake and oriented, normal mood CNS:  No cranial nerve deficits.  Facial and right-sided weakness. Skin: Warm and dry.  No rashes noted.  The results of significant diagnostics from this hospitalization (including imaging, microbiology, ancillary and laboratory) are listed below for reference.     Diagnostic Studies:   CT ANGIO HEAD NECK W WO CM  Result Date: 10/18/2020 CLINICAL DATA:  Stroke follow-up EXAM: CT ANGIOGRAPHY HEAD AND NECK TECHNIQUE: Multidetector CT imaging of the head and neck was performed using the standard protocol during bolus administration of intravenous contrast. Multiplanar CT image reconstructions and MIPs were obtained to evaluate the vascular anatomy. Carotid stenosis measurements (when applicable) are obtained utilizing NASCET criteria, using the distal internal carotid diameter as the denominator. CONTRAST:  61mL OMNIPAQUE IOHEXOL 350 MG/ML SOLN COMPARISON:  Brain MRI from earlier the same day FINDINGS: CTA NECK FINDINGS Aortic arch: Atheromatous plaque.  Two vessel branching. Right carotid system: Mainly low-density plaque at the bifurcation without ulceration or flow limiting stenosis. Left carotid system: Mainly low-density plaque at the distal common carotid  without flow limiting stenosis or ulceration. Vertebral arteries: Proximal subclavian atherosclerosis without flow limiting stenosis. Mixed density plaque at the right V1 segment causes high-grade narrowing. No vertebral dissection or beading. Skeleton: Mandibular cavity and periapical erosion on the right Other neck: Thyroid atrophy. Upper chest: Partial coverage of large main pulmonary artery measuring at least 3.8 cm, consistent with pulmonary hypertension. Review of the MIP images confirms the above findings CTA HEAD FINDINGS Anterior circulation: Atheromatous plaque along the carotid siphons. Left M1 occlusion with limited reconstitution in the arterial phase. Fenestrated appearance to the right M1 segment. Negative for aneurysm. Generalized atheromatous changes to medium size vessels. Posterior circulation: Atheromatous calcification involving the right vertebral artery. No PCA occlusion or flow limiting stenosis. Fetal type right PCA flow. Venous sinuses: Unremarkable in the arterial phase Anatomic variants: As above High-density along the planum sphenoidale which appears dural based, up to 4 mm in thickness and 14 mm in diameter. There is hyperostosis of the underlying bone. Review of the MIP images confirms the above findings IMPRESSION: 1. Left M1 occlusion. 2. No flow limiting stenosis or embolic source seen in the atheromatous cervical carotids. 3. High-grade atheromatous narrowing at the right vertebral origin. 4. 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 After convalescence a postcontrast brain MRI can be obtained. Electronically Signed   By: Monte Fantasia M.D.   On: 10/18/2020 07:02   CT HEAD WO CONTRAST  Result Date: 10/18/2020 CLINICAL DATA:  Recent fall with right-sided facial droop and right-sided weakness, initial encounter EXAM: CT HEAD WITHOUT CONTRAST CT CERVICAL SPINE WITHOUT CONTRAST TECHNIQUE: Multidetector CT imaging of the head and cervical spine was performed following the  standard protocol without intravenous contrast. Multiplanar CT image reconstructions of the cervical spine were also generated. COMPARISON:  09/20/2010 FINDINGS: CT HEAD FINDINGS Brain: Geographic area of decreased attenuation is identified in the distribution of the left middle cerebral artery consistent with acute to subacute infarct. This is new from the prior exam. Scattered cerebellar hypodensities are noted consistent with prior infarcts. No hemorrhage is seen. No abnormal mass is noted. Vascular: Mild hyperdensity is noted in the left middle cerebral artery likely representing thrombosis. Skull: Normal. Negative for fracture or focal lesion. Sinuses/Orbits: No acute finding. Other: None. CT CERVICAL SPINE FINDINGS Alignment: Within normal limits. Skull base and vertebrae: 7 cervical segments are well visualized. Vertebral body height is well maintained. Osteophytic changes are noted at C5-6 and C6-7 with mild disc space narrowing. No significant facet hypertrophic changes are noted. No acute fracture is seen. Soft tissues and spinal canal: Surrounding soft tissue structures are within normal limits. Upper chest: Visualized lung apices are within normal limits. Other: None IMPRESSION: CT of the head: Evolving left MCA infarct with a hyperdense proximal left MCA. Old cerebellar infarcts. CT of the cervical spine: Degenerative change without acute abnormality. Critical Value/emergent results were called by telephone at the time of interpretation on 10/18/2020 at 12:01 am to Dr. Thayer Jew , who verbally acknowledged these results. Electronically Signed   By: Inez Catalina M.D.   On: 10/18/2020 00:02   CT CERVICAL SPINE WO CONTRAST  Result Date: 10/18/2020 CLINICAL DATA:  Recent fall with right-sided facial droop and right-sided weakness, initial encounter EXAM: CT HEAD WITHOUT CONTRAST CT CERVICAL SPINE WITHOUT CONTRAST TECHNIQUE: Multidetector CT imaging of the head and cervical spine was performed  following the standard protocol without intravenous contrast. Multiplanar CT image reconstructions of the cervical spine were also generated. COMPARISON:  09/20/2010 FINDINGS: CT HEAD FINDINGS Brain: Geographic area of decreased attenuation is identified in the distribution of the left middle cerebral artery consistent with acute to subacute infarct. This is new from the prior exam. Scattered cerebellar hypodensities are noted consistent with prior infarcts. No hemorrhage is seen. No abnormal mass is noted. Vascular: Mild hyperdensity is noted in the left middle cerebral artery likely representing thrombosis. Skull: Normal. Negative for fracture or focal lesion. Sinuses/Orbits: No acute finding. Other: None. CT CERVICAL SPINE FINDINGS Alignment: Within normal limits. Skull base and vertebrae: 7 cervical segments are well visualized. Vertebral body height is well maintained. Osteophytic changes are noted at C5-6 and C6-7 with mild disc space narrowing. No significant facet hypertrophic changes are noted. No acute fracture is seen. Soft tissues and spinal canal: Surrounding soft tissue structures are within normal limits. Upper chest: Visualized lung apices are within normal limits. Other: None IMPRESSION: CT of the head: Evolving left MCA infarct with a hyperdense proximal left MCA. Old cerebellar infarcts. CT of the cervical spine: Degenerative change without acute abnormality. Critical Value/emergent results were called by telephone at the time of interpretation on 10/18/2020 at 12:01 am to Dr. Thayer Jew , who verbally acknowledged these results. Electronically Signed   By: Inez Catalina M.D.   On: 10/18/2020 00:02   MR ANGIO HEAD WO CONTRAST  Result Date: 10/18/2020 CLINICAL DATA:  Initial evaluation for acute stroke. EXAM: MRI HEAD WITHOUT CONTRAST MRA HEAD  WITHOUT CONTRAST TECHNIQUE: Multiplanar, multi-echo pulse sequences of the brain and surrounding structures were acquired without intravenous  contrast. Angiographic images of the Circle of Willis were acquired using MRA technique without intravenous contrast. COMPARISON: No pertinent prior exam. COMPARISON:  Prior CT from 10/17/2020 FINDINGS: MRI HEAD FINDINGS Brain: Examination mildly degraded by motion artifact. Diffuse prominence of the CSF containing spaces compatible generalized age-related cerebral atrophy. Patchy and confluent T2/FLAIR hyperintensity within the periventricular and deep white matter both cerebral hemispheres most consistent with chronic small vessel ischemic disease. Remote lacunar infarct at the anterior genu of the corpus callosum. Few remote lacunar infarcts about the pons. Multiple bilateral cerebellar infarcts noted, right greater than left. Moderately large confluent area of restricted diffusion involving the left frontal and temporal regions consistent with an acute to early subacute left MCA distribution infarct. Involvement of the left insula, overlying left frontal operculum, left basal ganglia, and mid-posterior left temporal lobe. Mild petechial hemorrhage at the level of the left lentiform nucleus without frank hemorrhagic transformation. Minimal effacement of the left lateral ventricle without significant regional mass effect or midline shift at this time. No other evidence for acute or subacute ischemia. Gray-white matter differentiation otherwise maintained. No other evidence for acute or chronic intracranial hemorrhage. No mass lesion. No hydrocephalus or extra-axial fluid collection. No made of an empty sella. Midline structures intact. Vascular: Attenuated flow within the left MCA distribution. Major intracranial vascular flow voids are otherwise maintained. Skull and upper cervical spine: Craniocervical junction within normal limits. Bone marrow signal intensity within normal limits. Mild edema noted at the scalp vertex. Sinuses/Orbits: Globes and orbital soft tissues demonstrate no acute finding. Mild scattered  mucosal thickening noted within the ethmoidal air cells and maxillary sinuses. Paranasal sinuses are otherwise clear. Small right mastoid effusion noted, of doubtful significance. Inner ear structures grossly normal. Other: None. MRA HEAD FINDINGS ANTERIOR CIRCULATION: Examination mildly degraded by motion artifact. Distal cervical segments of the internal carotid arteries are patent with antegrade flow. Petrous segments patent bilaterally. Mild atheromatous irregularity within the carotid siphons without hemodynamically significant stenosis. A1 segments patent bilaterally. Normal anterior communicating artery complex. Anterior cerebral arteries patent to their distal aspects without stenosis. There is occlusion of the proximal-mid left M1 segment. A small temporal branch remains perfused. Otherwise, no significant flow seen within the left MCA distribution by MRA. Right M1 segment patent without stenosis. Right MCA bifurcation grossly within normal limits, although evaluation somewhat limited by motion. Distal right MCA branches well perfused. POSTERIOR CIRCULATION: Both V4 segments patent to the vertebrobasilar junction without stenosis. Right vertebral artery slightly dominant. Right PICA patent. Left PICA not seen. Basilar widely patent proximally. Short-segment mild stenosis at the distal basilar artery noted (series 1036, image 14). Superior cerebellar arteries patent bilaterally. Left PCA supplied via the basilar as well as a small left posterior communicating artery. Predominant fetal type origin of the right PCA. Both PCAs well perfused or distal aspects without stenosis. No aneurysm. IMPRESSION: MRI HEAD IMPRESSION: 1. Moderately large acute to early subacute left MCA distribution infarct as above. Associated mild petechial hemorrhage without frank hemorrhagic transformation. No significant mass effect at this time. 2. Underlying age-related cerebral atrophy with chronic small vessel ischemic disease with  multiple remote bilateral cerebellar infarcts. MRA HEAD IMPRESSION: 1. Occlusion of the proximal-mid left M1 segment. A small temporal branch remains perfused. Otherwise, no significant flow within the left MCA distribution by MRA. 2. Mild atherosclerotic change elsewhere within the intracranial circulation with no other hemodynamically significant or  correctable stenosis. Electronically Signed   By: Jeannine Boga M.D.   On: 10/18/2020 03:01   MR BRAIN WO CONTRAST  Result Date: 10/18/2020 CLINICAL DATA:  Initial evaluation for acute stroke. EXAM: MRI HEAD WITHOUT CONTRAST MRA HEAD WITHOUT CONTRAST TECHNIQUE: Multiplanar, multi-echo pulse sequences of the brain and surrounding structures were acquired without intravenous contrast. Angiographic images of the Circle of Willis were acquired using MRA technique without intravenous contrast. COMPARISON: No pertinent prior exam. COMPARISON:  Prior CT from 10/17/2020 FINDINGS: MRI HEAD FINDINGS Brain: Examination mildly degraded by motion artifact. Diffuse prominence of the CSF containing spaces compatible generalized age-related cerebral atrophy. Patchy and confluent T2/FLAIR hyperintensity within the periventricular and deep white matter both cerebral hemispheres most consistent with chronic small vessel ischemic disease. Remote lacunar infarct at the anterior genu of the corpus callosum. Few remote lacunar infarcts about the pons. Multiple bilateral cerebellar infarcts noted, right greater than left. Moderately large confluent area of restricted diffusion involving the left frontal and temporal regions consistent with an acute to early subacute left MCA distribution infarct. Involvement of the left insula, overlying left frontal operculum, left basal ganglia, and mid-posterior left temporal lobe. Mild petechial hemorrhage at the level of the left lentiform nucleus without frank hemorrhagic transformation. Minimal effacement of the left lateral ventricle  without significant regional mass effect or midline shift at this time. No other evidence for acute or subacute ischemia. Gray-white matter differentiation otherwise maintained. No other evidence for acute or chronic intracranial hemorrhage. No mass lesion. No hydrocephalus or extra-axial fluid collection. No made of an empty sella. Midline structures intact. Vascular: Attenuated flow within the left MCA distribution. Major intracranial vascular flow voids are otherwise maintained. Skull and upper cervical spine: Craniocervical junction within normal limits. Bone marrow signal intensity within normal limits. Mild edema noted at the scalp vertex. Sinuses/Orbits: Globes and orbital soft tissues demonstrate no acute finding. Mild scattered mucosal thickening noted within the ethmoidal air cells and maxillary sinuses. Paranasal sinuses are otherwise clear. Small right mastoid effusion noted, of doubtful significance. Inner ear structures grossly normal. Other: None. MRA HEAD FINDINGS ANTERIOR CIRCULATION: Examination mildly degraded by motion artifact. Distal cervical segments of the internal carotid arteries are patent with antegrade flow. Petrous segments patent bilaterally. Mild atheromatous irregularity within the carotid siphons without hemodynamically significant stenosis. A1 segments patent bilaterally. Normal anterior communicating artery complex. Anterior cerebral arteries patent to their distal aspects without stenosis. There is occlusion of the proximal-mid left M1 segment. A small temporal branch remains perfused. Otherwise, no significant flow seen within the left MCA distribution by MRA. Right M1 segment patent without stenosis. Right MCA bifurcation grossly within normal limits, although evaluation somewhat limited by motion. Distal right MCA branches well perfused. POSTERIOR CIRCULATION: Both V4 segments patent to the vertebrobasilar junction without stenosis. Right vertebral artery slightly dominant.  Right PICA patent. Left PICA not seen. Basilar widely patent proximally. Short-segment mild stenosis at the distal basilar artery noted (series 1036, image 14). Superior cerebellar arteries patent bilaterally. Left PCA supplied via the basilar as well as a small left posterior communicating artery. Predominant fetal type origin of the right PCA. Both PCAs well perfused or distal aspects without stenosis. No aneurysm. IMPRESSION: MRI HEAD IMPRESSION: 1. Moderately large acute to early subacute left MCA distribution infarct as above. Associated mild petechial hemorrhage without frank hemorrhagic transformation. No significant mass effect at this time. 2. Underlying age-related cerebral atrophy with chronic small vessel ischemic disease with multiple remote bilateral cerebellar infarcts. MRA HEAD IMPRESSION:  1. Occlusion of the proximal-mid left M1 segment. A small temporal branch remains perfused. Otherwise, no significant flow within the left MCA distribution by MRA. 2. Mild atherosclerotic change elsewhere within the intracranial circulation with no other hemodynamically significant or correctable stenosis. Electronically Signed   By: Jeannine Boga M.D.   On: 10/18/2020 03:01   DG Swallowing Func-Speech Pathology  Result Date: 10/18/2020 Objective Swallowing Evaluation: Type of Study: Bedside Swallow Evaluation  Patient Details Name: Angelica Strickland MRN: [id number] Date of Birth: [birth year] Today's Date: 10/18/2020 Time: SLP Start Time (ACUTE ONLY): 0948 -SLP Stop Time (ACUTE ONLY): 1005 SLP Time Calculation (min) (ACUTE ONLY): 17 min Past Medical History: Past Medical History: Diagnosis Date  Atrial fibrillation ([place]) 11/11/2014  Carpal tunnel syndrome 11/11/2014  Congestive heart failure ([place]) 11/11/2014  COPD (chronic obstructive pulmonary disease) ([place]) 11/11/2014  Depression 11/11/2014  Essential hypertension 11/11/2014  History of pituitary tumor 11/11/2014  Hypopotassemia 11/11/2014  Osteoarthritis 11/11/2014   Thrombocytopenia ([place]) 11/11/2014 Past Surgical History: Past Surgical History: Procedure Laterality Date  CARDIAC CATHETERIZATION    PITUITARY SURGERY    UTERINE FIBROID SURGERY   HPI: Pt is a 65 yo adm to [facility] after recent fall, new onset of right side weakness, facial asymmetery and aphasia.  Imaging of brain showed moderately large acute to subacute CVA due to Left M1 occlusion.  Pt also with remote bilateral cerebellar cvas.  Cervical spine imaging showed osteophytic changes C5-C6, C6-C7.  PMH + for pituitary tumor, COPD, CHF, smoking.  .  Pt did not pass [facility].  Subjective: pt awake in chair, sleepier than normal Assessment / Plan / Recommendation CHL IP CLINICAL IMPRESSIONS 10/18/2020 Clinical Impression Pt presents with moderate oral and mild pharyngeal dysphagia. Impaired oral coordination results in delay in transiting, premature spillage and compromised mastication ability *more than just from cervical collar in place.  Pharyngeal swallow c/b pooling of liquids to pyriform sinus for up to 2-3 seconds prior to swallow trigger. Pharyngeal swallow is strong without retention. Pt did not aspirate or penetrate but with amount of pooling prior to swallow, pt's inability to cough on command and multiple comorbidities increasing her asp pna risk, recommend to initiate a conservative diet of dys1/nectar and allow thin liquids between meals with strict precautions.  Oral suction before and after and medicine crushed with puree.  SLP will follow for dysphagia managment, addressing indication for advancement in diet and compensations for speech/swallow and language.  Thanks for this consult. SLP Visit Diagnosis Dysphagia, oropharyngeal phase (R13.12) Attention and concentration deficit following -- Frontal lobe and executive function deficit following -- Impact on safety and function Moderate aspiration risk   CHL IP TREATMENT RECOMMENDATION 10/18/2020 Treatment Recommendations Therapy as outlined in treatment plan below    Prognosis 10/18/2020 Prognosis for Safe Diet Advancement Good Barriers to Reach Goals -- Barriers/Prognosis Comment -- CH[facility] IP DIET RECOMMENDATION 10/18/2020 SLP Diet Recommendations Dysphagia 1 (Puree) solids;Nectar thick liquid;Other (Comment) Liquid Administration via Cup;Straw Medication Administration Crushed with puree Compensations Small sips/bites;Slow rate;Lingual sweep for clearance of pocketing Postural Changes Seated upright at 90 degrees;Remain semi-upright after after feeds/meals (Comment)   CHL IP OTHER RECOMMENDATIONS 10/18/2020 Recommended Consults -- Oral Care Recommendations -- Other Recommendations Have oral suction available   [facility] IP FOLLOW UP RECOMMENDATIONS 10/18/2020 Follow up Recommendations Skilled Nursing facility   [facility] IP FREQUENCY AND DURATION 10/18/2020 Speech Therapy Frequency (ACUTE ONLY) min 2x/week Treatment Duration 2 weeks      [facility] IP ORAL PHASE 10/18/2020 Oral Phase Impaired Oral - Pudding  Teaspoon -- Oral - Pudding Cup -- Oral - Honey Teaspoon -- Oral - Honey Cup -- Oral - Nectar Teaspoon Weak lingual manipulation;Delayed oral transit;Premature spillage Oral - Nectar Cup Weak lingual manipulation;Delayed oral transit;Premature spillage Oral - Nectar Straw Delayed oral transit;Weak lingual manipulation;Premature spillage Oral - Thin Teaspoon Weak lingual manipulation;Decreased bolus cohesion;Premature spillage Oral - Thin Cup -- Oral - Thin Straw Weak lingual manipulation;Delayed oral transit;Premature spillage Oral - Puree Delayed oral transit;Weak lingual manipulation Oral - Mech Soft Impaired mastication;Weak lingual manipulation;Delayed oral transit;Lingual/palatal residue Oral - Regular -- Oral - Multi-Consistency -- Oral - Pill -- Oral Phase - Comment cervical collar in place that may have further exacerbated mastication deficits but obvious oral musculature weakness impacting as well  [facility] IP PHARYNGEAL PHASE 10/18/2020 Pharyngeal Phase Impaired Pharyngeal- Pudding Teaspoon --  Pharyngeal -- Pharyngeal- Pudding Cup -- Pharyngeal -- Pharyngeal- Honey Teaspoon -- Pharyngeal -- Pharyngeal- Honey Cup -- Pharyngeal -- Pharyngeal- Nectar Teaspoon Delayed swallow initiation-pyriform sinuses Pharyngeal Material does not enter airway Pharyngeal- Nectar Cup Delayed swallow initiation-pyriform sinuses Pharyngeal Material does not enter airway Pharyngeal- Nectar Straw Delayed swallow initiation-pyriform sinuses Pharyngeal Material does not enter airway Pharyngeal- Thin Teaspoon Delayed swallow initiation-pyriform sinuses Pharyngeal Material does not enter airway Pharyngeal- Thin Cup -- Pharyngeal -- Pharyngeal- Thin Straw Delayed swallow initiation-pyriform sinuses Pharyngeal Material does not enter airway Pharyngeal- Puree Delayed swallow initiation-vallecula Pharyngeal Material does not enter airway Pharyngeal- Mechanical Soft Delayed swallow initiation-vallecula Pharyngeal Material does not enter airway Pharyngeal- Regular -- Pharyngeal -- Pharyngeal- Multi-consistency -- Pharyngeal -- Pharyngeal- Pill -- Pharyngeal -- Pharyngeal Comment pharyngeal swallow is strong albeit delayed  [facility] IP CERVICAL ESOPHAGEAL PHASE 10/18/2020 Cervical Esophageal Phase Impaired Pudding Teaspoon -- Pudding Cup -- Honey Teaspoon -- Honey Cup -- Nectar Teaspoon -- Nectar Cup -- Nectar Straw -- Thin Teaspoon -- Thin Cup -- Thin Straw -- Puree -- Mechanical Soft -- Regular -- Multi-consistency -- Pill -- Cervical Esophageal Comment could not view distal cervical spine due to pt's shoulder elevated and covering view, therefore unable to determine impact of cervical osteophytes on swallow - however did not view obvious barium retention, Upon esophageal sweep- esophagus appeared clear Kathleen Lime, MS [facility] SLP Acute Rehab Services Office [phone] Pager [phone] [name] 10/18/2020, 10:41 AM                Labs:   Basic Metabolic Panel: Recent Labs  Lab 10/26/20 0149 10/28/20 0515 10/29/20 0451  10/31/20 0428  NA 135 138 141 143  K 5.4* 3.8 3.6 3.7  CL 113* 111 107 106  CO2 15* 21* 25 27  GLUCOSE 118* 102* 108* 113*  BUN 38* 32* 30* 32*  CREATININE 0.99 0.98 0.98 1.04*  CALCIUM 8.6* 8.7* 8.7* 9.1  MG 2.3 2.0 2.0 2.1  PHOS  --  3.6 3.6 3.9   GFR CrCl cannot be calculated (Unknown ideal weight.). Liver Function Tests: Recent Labs  Lab 10/29/20 0451 10/31/20 0428  AST 59* 35  ALT 206* 124*  ALKPHOS 86 83  BILITOT 0.9 1.0  PROT 6.2* 6.3*  ALBUMIN 3.3* 3.3*   No results for input(s): LIPASE, AMYLASE in the last 168 hours. No results for input(s): AMMONIA in the last 168 hours. Coagulation profile No results for input(s): INR, PROTIME in the last 168 hours.  CBC: Recent Labs  Lab 10/26/20 0149 10/28/20 0515 10/29/20 0451 10/31/20 0428  WBC 7.5 6.5 6.9 8.0  HGB 11.6* 10.8* 11.4* 11.6*  HCT 35.4* 33.9* 35.9* 36.0  MCV 101.7*  102.7* 102.3* 101.4*  PLT 151 163 167 188   Cardiac Enzymes: No results for input(s): CKTOTAL, CKMB, CKMBINDEX, TROPONINI in the last 168 hours. BNP: Invalid input(s): POCBNP CBG: No results for input(s): GLUCAP in the last 168 hours. D-Dimer No results for input(s): DDIMER in the last 72 hours. Hgb A1c No results for input(s): HGBA1C in the last 72 hours. Lipid Profile No results for input(s): CHOL, HDL, LDLCALC, TRIG, CHOLHDL, LDLDIRECT in the last 72 hours. Thyroid function studies No results for input(s): TSH, T4TOTAL, T3FREE, THYROIDAB in the last 72 hours.  Invalid input(s): FREET3 Anemia work up No results for input(s): VITAMINB12, FOLATE, FERRITIN, TIBC, IRON, RETICCTPCT in the last 72 hours. Microbiology No results found for this or any previous visit (from the past 240 hour(s)).   Discharge Instructions:   Discharge Instructions     Ambulatory referral to Neurology   Complete by: As directed    Follow up with stroke clinic NP (Jessica Vanschaick or Cecille Rubin, if both not available, consider Zachery Dauer,  or [place]) at [facility] in about 4 weeks. Thanks.   Diet - low sodium heart healthy   Complete by: As directed    Dysphagia 1 diet with nectar thick liquids   Discharge instructions   Complete by: As directed    Follow-up with your primary care provider at the skilled nursing facility in 3 to 5 days.  Check blood work at that time.   Increase activity slowly   Complete by: As directed       Allergies as of 10/31/2020       Reactions   Coumadin  [warfarin] Other (See Comments)   Resulted in bleeding into her brain   Clonidine Derivatives Rash        Medication List     STOP taking these medications    Klor-Con M20 20 MEQ tablet Generic drug: potassium chloride SA       TAKE these medications    albuterol 108 (90 Base) MCG/ACT inhaler Commonly known as: VENTOLIN HFA Inhale 1 puff into the lungs every 6 (six) hours as needed for wheezing or shortness of breath.   apixaban 5 MG Tabs tablet Commonly known as: ELIQUIS Take 1 tablet (5 mg total) by mouth 2 (two) times daily.   atorvastatin 40 MG tablet Commonly known as: LIPITOR Take 1 tablet (40 mg total) by mouth daily. Start taking on: November 01, 2020   carvedilol 12.5 MG tablet Commonly known as: COREG Take 1 tablet (12.5 mg total) by mouth 2 (two) times daily with a meal. Hold for systolic blood pressure less than 110 and HR<60. What changed: additional instructions   lisinopril 2.5 MG tablet Commonly known as: ZESTRIL Take 1 tablet (2.5 mg total) by mouth daily. HOLD FOR SBP<110 What changed: additional instructions   methylphenidate 5 MG tablet Commonly known as: RITALIN Take 1 tablet (5 mg total) by mouth 2 (two) times daily with breakfast and lunch. Start taking on: November 01, 2020   spironolactone 25 MG tablet Commonly known as: ALDACTONE Take 1 tablet (25 mg total) by mouth daily. Hold for Systolic blood pressure less than 110 What changed: additional instructions   torsemide 20 MG tablet Commonly known  as: DEMADEX Take 2 tablets (40 mg total) by mouth daily.        Follow-up Information     [facility]. Schedule an appointment as soon as possible for a visit in 4 week(s).   Specialty: Neurology Contact information: [phone] [address] [place] [phone]                 Time coordinating discharge: 39 minutes  Signed:  Therese Rocco  [facility] 10/31/2020, 2:25 PM

## 2020-10-31 NOTE — TOC Transition Note (Signed)
Transition of Care [facility]) - CM/SW Discharge Note   Patient Details  Name: Angelica Strickland MRN: [id number] Date of Birth: [birth year]  Transition of [facility]) CM/SW Contact:  Geralynn Ochs, LCSW Phone Number: 10/31/2020, 3:42 PM   Clinical Narrative:   Nurse to call report to [phone], Room 401    Final next level of care: [place] Barriers to Discharge: Barriers Resolved   Patient Goals and CMS Choice Patient states their goals for this hospitalization and ongoing recovery are:: patient unable to participate in goal setting, not fully oriented CMS Medicare.gov Compare Post Acute Care list provided to:: Patient Represenative (must comment) Choice offered to / list presented to : Sibling  Discharge Placement              Patient chooses bed at: [facility] Patient to be transferred to facility by: [place] Name of family member notified: Cathy Patient and family notified of of transfer: 10/31/20  Discharge Plan and Services     Post Acute Care Choice: [place]                               Social Determinants of Health (SDOH) Interventions     Readmission Risk Interventions No flowsheet data found.

## 2020-11-01 DIAGNOSIS — I5022 Chronic systolic (congestive) heart failure: Secondary | ICD-10-CM | POA: Diagnosis not present

## 2020-11-01 DIAGNOSIS — I10 Essential (primary) hypertension: Secondary | ICD-10-CM | POA: Diagnosis not present

## 2020-11-01 DIAGNOSIS — D329 Benign neoplasm of meninges, unspecified: Secondary | ICD-10-CM | POA: Diagnosis not present

## 2020-11-01 DIAGNOSIS — I6350 Cerebral infarction due to unspecified occlusion or stenosis of unspecified cerebral artery: Secondary | ICD-10-CM | POA: Diagnosis not present

## 2020-11-01 DIAGNOSIS — I639 Cerebral infarction, unspecified: Secondary | ICD-10-CM | POA: Diagnosis not present

## 2020-11-01 DIAGNOSIS — R4701 Aphasia: Secondary | ICD-10-CM | POA: Diagnosis not present

## 2020-11-01 DIAGNOSIS — F32A Depression, unspecified: Secondary | ICD-10-CM | POA: Diagnosis not present

## 2020-11-01 DIAGNOSIS — I63512 Cerebral infarction due to unspecified occlusion or stenosis of left middle cerebral artery: Secondary | ICD-10-CM | POA: Diagnosis not present

## 2020-11-01 DIAGNOSIS — G459 Transient cerebral ischemic attack, unspecified: Secondary | ICD-10-CM | POA: Diagnosis not present

## 2020-11-01 DIAGNOSIS — Z743 Need for continuous supervision: Secondary | ICD-10-CM | POA: Diagnosis not present

## 2020-11-01 DIAGNOSIS — I480 Paroxysmal atrial fibrillation: Secondary | ICD-10-CM | POA: Diagnosis not present

## 2020-11-01 DIAGNOSIS — I110 Hypertensive heart disease with heart failure: Secondary | ICD-10-CM | POA: Diagnosis not present

## 2020-11-01 DIAGNOSIS — R0600 Dyspnea, unspecified: Secondary | ICD-10-CM | POA: Diagnosis not present

## 2020-11-01 DIAGNOSIS — Z7401 Bed confinement status: Secondary | ICD-10-CM | POA: Diagnosis not present

## 2020-11-01 DIAGNOSIS — I69351 Hemiplegia and hemiparesis following cerebral infarction affecting right dominant side: Secondary | ICD-10-CM | POA: Diagnosis not present

## 2020-11-01 DIAGNOSIS — R531 Weakness: Secondary | ICD-10-CM | POA: Diagnosis not present

## 2020-11-01 DIAGNOSIS — J449 Chronic obstructive pulmonary disease, unspecified: Secondary | ICD-10-CM | POA: Diagnosis not present

## 2020-11-01 DIAGNOSIS — I69320 Aphasia following cerebral infarction: Secondary | ICD-10-CM | POA: Diagnosis not present

## 2020-11-01 DIAGNOSIS — I69391 Dysphagia following cerebral infarction: Secondary | ICD-10-CM | POA: Diagnosis not present

## 2020-11-01 DIAGNOSIS — M1990 Unspecified osteoarthritis, unspecified site: Secondary | ICD-10-CM | POA: Diagnosis not present

## 2020-11-01 DIAGNOSIS — I509 Heart failure, unspecified: Secondary | ICD-10-CM | POA: Diagnosis not present

## 2020-11-01 DIAGNOSIS — N183 Chronic kidney disease, stage 3 unspecified: Secondary | ICD-10-CM | POA: Diagnosis not present

## 2020-11-01 NOTE — Progress Notes (Signed)
Patient discharge to skilled nursing facility. Transferred via stretcher by [facility].

## 2020-11-05 DIAGNOSIS — D329 Benign neoplasm of meninges, unspecified: Secondary | ICD-10-CM | POA: Diagnosis not present

## 2020-11-05 DIAGNOSIS — I69391 Dysphagia following cerebral infarction: Secondary | ICD-10-CM | POA: Diagnosis not present

## 2020-11-05 DIAGNOSIS — N183 Chronic kidney disease, stage 3 unspecified: Secondary | ICD-10-CM | POA: Diagnosis not present

## 2020-11-05 DIAGNOSIS — J449 Chronic obstructive pulmonary disease, unspecified: Secondary | ICD-10-CM | POA: Diagnosis not present

## 2020-11-05 DIAGNOSIS — I110 Hypertensive heart disease with heart failure: Secondary | ICD-10-CM | POA: Diagnosis not present

## 2020-11-05 DIAGNOSIS — R4701 Aphasia: Secondary | ICD-10-CM | POA: Diagnosis not present

## 2020-11-05 DIAGNOSIS — I5022 Chronic systolic (congestive) heart failure: Secondary | ICD-10-CM | POA: Diagnosis not present

## 2020-11-05 DIAGNOSIS — I69351 Hemiplegia and hemiparesis following cerebral infarction affecting right dominant side: Secondary | ICD-10-CM | POA: Diagnosis not present

## 2020-11-05 DIAGNOSIS — I480 Paroxysmal atrial fibrillation: Secondary | ICD-10-CM | POA: Diagnosis not present

## 2020-11-05 DIAGNOSIS — I639 Cerebral infarction, unspecified: Secondary | ICD-10-CM | POA: Diagnosis not present

## 2020-11-05 DIAGNOSIS — I6350 Cerebral infarction due to unspecified occlusion or stenosis of unspecified cerebral artery: Secondary | ICD-10-CM | POA: Diagnosis not present

## 2020-11-05 DIAGNOSIS — I509 Heart failure, unspecified: Secondary | ICD-10-CM | POA: Diagnosis not present

## 2020-11-24 DIAGNOSIS — G8101 Flaccid hemiplegia affecting right dominant side: Secondary | ICD-10-CM | POA: Diagnosis not present

## 2020-11-24 DIAGNOSIS — R278 Other lack of coordination: Secondary | ICD-10-CM | POA: Diagnosis not present

## 2020-11-24 DIAGNOSIS — I63512 Cerebral infarction due to unspecified occlusion or stenosis of left middle cerebral artery: Secondary | ICD-10-CM | POA: Diagnosis not present

## 2020-11-24 DIAGNOSIS — I69351 Hemiplegia and hemiparesis following cerebral infarction affecting right dominant side: Secondary | ICD-10-CM | POA: Diagnosis not present

## 2020-11-24 DIAGNOSIS — R293 Abnormal posture: Secondary | ICD-10-CM | POA: Diagnosis not present

## 2020-11-24 DIAGNOSIS — M6281 Muscle weakness (generalized): Secondary | ICD-10-CM | POA: Diagnosis not present

## 2020-11-24 DIAGNOSIS — I639 Cerebral infarction, unspecified: Secondary | ICD-10-CM | POA: Diagnosis not present

## 2020-11-25 DIAGNOSIS — R278 Other lack of coordination: Secondary | ICD-10-CM | POA: Diagnosis not present

## 2020-11-25 DIAGNOSIS — R293 Abnormal posture: Secondary | ICD-10-CM | POA: Diagnosis not present

## 2020-11-25 DIAGNOSIS — I63512 Cerebral infarction due to unspecified occlusion or stenosis of left middle cerebral artery: Secondary | ICD-10-CM | POA: Diagnosis not present

## 2020-11-25 DIAGNOSIS — G8101 Flaccid hemiplegia affecting right dominant side: Secondary | ICD-10-CM | POA: Diagnosis not present

## 2020-11-25 DIAGNOSIS — I69351 Hemiplegia and hemiparesis following cerebral infarction affecting right dominant side: Secondary | ICD-10-CM | POA: Diagnosis not present

## 2020-11-25 DIAGNOSIS — I639 Cerebral infarction, unspecified: Secondary | ICD-10-CM | POA: Diagnosis not present

## 2020-11-25 DIAGNOSIS — M6281 Muscle weakness (generalized): Secondary | ICD-10-CM | POA: Diagnosis not present

## 2020-11-26 DIAGNOSIS — M6281 Muscle weakness (generalized): Secondary | ICD-10-CM | POA: Diagnosis not present

## 2020-11-26 DIAGNOSIS — R278 Other lack of coordination: Secondary | ICD-10-CM | POA: Diagnosis not present

## 2020-11-26 DIAGNOSIS — I63512 Cerebral infarction due to unspecified occlusion or stenosis of left middle cerebral artery: Secondary | ICD-10-CM | POA: Diagnosis not present

## 2020-11-26 DIAGNOSIS — I69351 Hemiplegia and hemiparesis following cerebral infarction affecting right dominant side: Secondary | ICD-10-CM | POA: Diagnosis not present

## 2020-11-26 DIAGNOSIS — I639 Cerebral infarction, unspecified: Secondary | ICD-10-CM | POA: Diagnosis not present

## 2020-11-26 DIAGNOSIS — G8101 Flaccid hemiplegia affecting right dominant side: Secondary | ICD-10-CM | POA: Diagnosis not present

## 2020-11-26 DIAGNOSIS — R293 Abnormal posture: Secondary | ICD-10-CM | POA: Diagnosis not present

## 2020-11-27 DIAGNOSIS — G8101 Flaccid hemiplegia affecting right dominant side: Secondary | ICD-10-CM | POA: Diagnosis not present

## 2020-11-27 DIAGNOSIS — I63512 Cerebral infarction due to unspecified occlusion or stenosis of left middle cerebral artery: Secondary | ICD-10-CM | POA: Diagnosis not present

## 2020-11-27 DIAGNOSIS — M6281 Muscle weakness (generalized): Secondary | ICD-10-CM | POA: Diagnosis not present

## 2020-11-27 DIAGNOSIS — I69351 Hemiplegia and hemiparesis following cerebral infarction affecting right dominant side: Secondary | ICD-10-CM | POA: Diagnosis not present

## 2020-11-27 DIAGNOSIS — R278 Other lack of coordination: Secondary | ICD-10-CM | POA: Diagnosis not present

## 2020-11-27 DIAGNOSIS — I639 Cerebral infarction, unspecified: Secondary | ICD-10-CM | POA: Diagnosis not present

## 2020-11-27 DIAGNOSIS — R293 Abnormal posture: Secondary | ICD-10-CM | POA: Diagnosis not present

## 2020-11-28 DIAGNOSIS — G8101 Flaccid hemiplegia affecting right dominant side: Secondary | ICD-10-CM | POA: Diagnosis not present

## 2020-11-28 DIAGNOSIS — R293 Abnormal posture: Secondary | ICD-10-CM | POA: Diagnosis not present

## 2020-11-28 DIAGNOSIS — I69351 Hemiplegia and hemiparesis following cerebral infarction affecting right dominant side: Secondary | ICD-10-CM | POA: Diagnosis not present

## 2020-11-28 DIAGNOSIS — R278 Other lack of coordination: Secondary | ICD-10-CM | POA: Diagnosis not present

## 2020-11-28 DIAGNOSIS — I639 Cerebral infarction, unspecified: Secondary | ICD-10-CM | POA: Diagnosis not present

## 2020-11-28 DIAGNOSIS — M6281 Muscle weakness (generalized): Secondary | ICD-10-CM | POA: Diagnosis not present

## 2020-11-28 DIAGNOSIS — I63512 Cerebral infarction due to unspecified occlusion or stenosis of left middle cerebral artery: Secondary | ICD-10-CM | POA: Diagnosis not present

## 2020-11-29 DIAGNOSIS — I63512 Cerebral infarction due to unspecified occlusion or stenosis of left middle cerebral artery: Secondary | ICD-10-CM | POA: Diagnosis not present

## 2020-11-29 DIAGNOSIS — R278 Other lack of coordination: Secondary | ICD-10-CM | POA: Diagnosis not present

## 2020-11-29 DIAGNOSIS — I639 Cerebral infarction, unspecified: Secondary | ICD-10-CM | POA: Diagnosis not present

## 2020-11-29 DIAGNOSIS — M6281 Muscle weakness (generalized): Secondary | ICD-10-CM | POA: Diagnosis not present

## 2020-11-29 DIAGNOSIS — I69351 Hemiplegia and hemiparesis following cerebral infarction affecting right dominant side: Secondary | ICD-10-CM | POA: Diagnosis not present

## 2020-11-29 DIAGNOSIS — G8101 Flaccid hemiplegia affecting right dominant side: Secondary | ICD-10-CM | POA: Diagnosis not present

## 2020-11-29 DIAGNOSIS — R293 Abnormal posture: Secondary | ICD-10-CM | POA: Diagnosis not present

## 2020-12-01 DIAGNOSIS — I639 Cerebral infarction, unspecified: Secondary | ICD-10-CM | POA: Diagnosis not present

## 2020-12-01 DIAGNOSIS — I69351 Hemiplegia and hemiparesis following cerebral infarction affecting right dominant side: Secondary | ICD-10-CM | POA: Diagnosis not present

## 2020-12-01 DIAGNOSIS — G8101 Flaccid hemiplegia affecting right dominant side: Secondary | ICD-10-CM | POA: Diagnosis not present

## 2020-12-01 DIAGNOSIS — M6281 Muscle weakness (generalized): Secondary | ICD-10-CM | POA: Diagnosis not present

## 2020-12-01 DIAGNOSIS — I63512 Cerebral infarction due to unspecified occlusion or stenosis of left middle cerebral artery: Secondary | ICD-10-CM | POA: Diagnosis not present

## 2020-12-01 DIAGNOSIS — R293 Abnormal posture: Secondary | ICD-10-CM | POA: Diagnosis not present

## 2020-12-01 DIAGNOSIS — R278 Other lack of coordination: Secondary | ICD-10-CM | POA: Diagnosis not present

## 2020-12-02 DIAGNOSIS — G8101 Flaccid hemiplegia affecting right dominant side: Secondary | ICD-10-CM | POA: Diagnosis not present

## 2020-12-02 DIAGNOSIS — M6281 Muscle weakness (generalized): Secondary | ICD-10-CM | POA: Diagnosis not present

## 2020-12-02 DIAGNOSIS — I63512 Cerebral infarction due to unspecified occlusion or stenosis of left middle cerebral artery: Secondary | ICD-10-CM | POA: Diagnosis not present

## 2020-12-02 DIAGNOSIS — R278 Other lack of coordination: Secondary | ICD-10-CM | POA: Diagnosis not present

## 2020-12-02 DIAGNOSIS — R293 Abnormal posture: Secondary | ICD-10-CM | POA: Diagnosis not present

## 2020-12-02 DIAGNOSIS — I639 Cerebral infarction, unspecified: Secondary | ICD-10-CM | POA: Diagnosis not present

## 2020-12-02 DIAGNOSIS — I69351 Hemiplegia and hemiparesis following cerebral infarction affecting right dominant side: Secondary | ICD-10-CM | POA: Diagnosis not present

## 2020-12-03 DIAGNOSIS — I6389 Other cerebral infarction: Secondary | ICD-10-CM | POA: Diagnosis not present

## 2020-12-03 DIAGNOSIS — I639 Cerebral infarction, unspecified: Secondary | ICD-10-CM | POA: Diagnosis not present

## 2020-12-03 DIAGNOSIS — I480 Paroxysmal atrial fibrillation: Secondary | ICD-10-CM | POA: Diagnosis not present

## 2020-12-03 DIAGNOSIS — I69351 Hemiplegia and hemiparesis following cerebral infarction affecting right dominant side: Secondary | ICD-10-CM | POA: Diagnosis not present

## 2020-12-03 DIAGNOSIS — I9589 Other hypotension: Secondary | ICD-10-CM | POA: Diagnosis not present

## 2020-12-03 DIAGNOSIS — R278 Other lack of coordination: Secondary | ICD-10-CM | POA: Diagnosis not present

## 2020-12-03 DIAGNOSIS — J984 Other disorders of lung: Secondary | ICD-10-CM | POA: Diagnosis not present

## 2020-12-03 DIAGNOSIS — M6281 Muscle weakness (generalized): Secondary | ICD-10-CM | POA: Diagnosis not present

## 2020-12-03 DIAGNOSIS — G8101 Flaccid hemiplegia affecting right dominant side: Secondary | ICD-10-CM | POA: Diagnosis not present

## 2020-12-03 DIAGNOSIS — I5022 Chronic systolic (congestive) heart failure: Secondary | ICD-10-CM | POA: Diagnosis not present

## 2020-12-03 DIAGNOSIS — I69391 Dysphagia following cerebral infarction: Secondary | ICD-10-CM | POA: Diagnosis not present

## 2020-12-03 DIAGNOSIS — I6350 Cerebral infarction due to unspecified occlusion or stenosis of unspecified cerebral artery: Secondary | ICD-10-CM | POA: Diagnosis not present

## 2020-12-03 DIAGNOSIS — I63512 Cerebral infarction due to unspecified occlusion or stenosis of left middle cerebral artery: Secondary | ICD-10-CM | POA: Diagnosis not present

## 2020-12-03 DIAGNOSIS — R4701 Aphasia: Secondary | ICD-10-CM | POA: Diagnosis not present

## 2020-12-03 DIAGNOSIS — R293 Abnormal posture: Secondary | ICD-10-CM | POA: Diagnosis not present

## 2020-12-03 DIAGNOSIS — I110 Hypertensive heart disease with heart failure: Secondary | ICD-10-CM | POA: Diagnosis not present

## 2020-12-04 DIAGNOSIS — R293 Abnormal posture: Secondary | ICD-10-CM | POA: Diagnosis not present

## 2020-12-04 DIAGNOSIS — I63512 Cerebral infarction due to unspecified occlusion or stenosis of left middle cerebral artery: Secondary | ICD-10-CM | POA: Diagnosis not present

## 2020-12-04 DIAGNOSIS — R278 Other lack of coordination: Secondary | ICD-10-CM | POA: Diagnosis not present

## 2020-12-04 DIAGNOSIS — M6281 Muscle weakness (generalized): Secondary | ICD-10-CM | POA: Diagnosis not present

## 2020-12-04 DIAGNOSIS — G8101 Flaccid hemiplegia affecting right dominant side: Secondary | ICD-10-CM | POA: Diagnosis not present

## 2020-12-04 DIAGNOSIS — I69351 Hemiplegia and hemiparesis following cerebral infarction affecting right dominant side: Secondary | ICD-10-CM | POA: Diagnosis not present

## 2020-12-04 DIAGNOSIS — I639 Cerebral infarction, unspecified: Secondary | ICD-10-CM | POA: Diagnosis not present

## 2020-12-05 ENCOUNTER — Encounter (HOSPITAL_COMMUNITY): Payer: Self-pay

## 2020-12-05 ENCOUNTER — Emergency Department (HOSPITAL_COMMUNITY): Payer: Medicare Other

## 2020-12-05 ENCOUNTER — Other Ambulatory Visit: Payer: Self-pay

## 2020-12-05 ENCOUNTER — Inpatient Hospital Stay (HOSPITAL_COMMUNITY)
Admission: EM | Admit: 2020-12-05 | Discharge: 2020-12-22 | DRG: 291 | Disposition: E | Payer: Medicare Other | Attending: Family Medicine | Admitting: Family Medicine

## 2020-12-05 DIAGNOSIS — Z888 Allergy status to other drugs, medicaments and biological substances status: Secondary | ICD-10-CM

## 2020-12-05 DIAGNOSIS — M6281 Muscle weakness (generalized): Secondary | ICD-10-CM | POA: Diagnosis not present

## 2020-12-05 DIAGNOSIS — R0602 Shortness of breath: Secondary | ICD-10-CM

## 2020-12-05 DIAGNOSIS — K8020 Calculus of gallbladder without cholecystitis without obstruction: Secondary | ICD-10-CM | POA: Diagnosis not present

## 2020-12-05 DIAGNOSIS — E875 Hyperkalemia: Secondary | ICD-10-CM | POA: Diagnosis present

## 2020-12-05 DIAGNOSIS — Z87891 Personal history of nicotine dependence: Secondary | ICD-10-CM

## 2020-12-05 DIAGNOSIS — G9341 Metabolic encephalopathy: Secondary | ICD-10-CM | POA: Diagnosis present

## 2020-12-05 DIAGNOSIS — I4891 Unspecified atrial fibrillation: Secondary | ICD-10-CM | POA: Diagnosis not present

## 2020-12-05 DIAGNOSIS — N1831 Chronic kidney disease, stage 3a: Secondary | ICD-10-CM | POA: Diagnosis not present

## 2020-12-05 DIAGNOSIS — E119 Type 2 diabetes mellitus without complications: Secondary | ICD-10-CM | POA: Diagnosis not present

## 2020-12-05 DIAGNOSIS — Z515 Encounter for palliative care: Secondary | ICD-10-CM

## 2020-12-05 DIAGNOSIS — I4819 Other persistent atrial fibrillation: Secondary | ICD-10-CM

## 2020-12-05 DIAGNOSIS — M1990 Unspecified osteoarthritis, unspecified site: Secondary | ICD-10-CM | POA: Diagnosis present

## 2020-12-05 DIAGNOSIS — Z823 Family history of stroke: Secondary | ICD-10-CM

## 2020-12-05 DIAGNOSIS — I10 Essential (primary) hypertension: Secondary | ICD-10-CM | POA: Diagnosis not present

## 2020-12-05 DIAGNOSIS — I639 Cerebral infarction, unspecified: Secondary | ICD-10-CM | POA: Diagnosis not present

## 2020-12-05 DIAGNOSIS — I5023 Acute on chronic systolic (congestive) heart failure: Secondary | ICD-10-CM | POA: Diagnosis not present

## 2020-12-05 DIAGNOSIS — I69351 Hemiplegia and hemiparesis following cerebral infarction affecting right dominant side: Secondary | ICD-10-CM

## 2020-12-05 DIAGNOSIS — E559 Vitamin D deficiency, unspecified: Secondary | ICD-10-CM | POA: Diagnosis not present

## 2020-12-05 DIAGNOSIS — D519 Vitamin B12 deficiency anemia, unspecified: Secondary | ICD-10-CM | POA: Diagnosis not present

## 2020-12-05 DIAGNOSIS — Z743 Need for continuous supervision: Secondary | ICD-10-CM | POA: Diagnosis not present

## 2020-12-05 DIAGNOSIS — I517 Cardiomegaly: Secondary | ICD-10-CM | POA: Diagnosis not present

## 2020-12-05 DIAGNOSIS — I63512 Cerebral infarction due to unspecified occlusion or stenosis of left middle cerebral artery: Secondary | ICD-10-CM | POA: Diagnosis not present

## 2020-12-05 DIAGNOSIS — R0689 Other abnormalities of breathing: Secondary | ICD-10-CM | POA: Diagnosis not present

## 2020-12-05 DIAGNOSIS — J90 Pleural effusion, not elsewhere classified: Secondary | ICD-10-CM | POA: Diagnosis not present

## 2020-12-05 DIAGNOSIS — R404 Transient alteration of awareness: Secondary | ICD-10-CM | POA: Diagnosis not present

## 2020-12-05 DIAGNOSIS — J9601 Acute respiratory failure with hypoxia: Secondary | ICD-10-CM | POA: Diagnosis not present

## 2020-12-05 DIAGNOSIS — I959 Hypotension, unspecified: Secondary | ICD-10-CM | POA: Diagnosis not present

## 2020-12-05 DIAGNOSIS — R000 Tachycardia, unspecified: Secondary | ICD-10-CM | POA: Diagnosis not present

## 2020-12-05 DIAGNOSIS — Z8673 Personal history of transient ischemic attack (TIA), and cerebral infarction without residual deficits: Secondary | ICD-10-CM | POA: Diagnosis not present

## 2020-12-05 DIAGNOSIS — N390 Urinary tract infection, site not specified: Secondary | ICD-10-CM | POA: Diagnosis not present

## 2020-12-05 DIAGNOSIS — S022XXA Fracture of nasal bones, initial encounter for closed fracture: Secondary | ICD-10-CM | POA: Diagnosis not present

## 2020-12-05 DIAGNOSIS — I420 Dilated cardiomyopathy: Secondary | ICD-10-CM | POA: Diagnosis present

## 2020-12-05 DIAGNOSIS — Z7901 Long term (current) use of anticoagulants: Secondary | ICD-10-CM

## 2020-12-05 DIAGNOSIS — Z66 Do not resuscitate: Secondary | ICD-10-CM | POA: Diagnosis not present

## 2020-12-05 DIAGNOSIS — D696 Thrombocytopenia, unspecified: Secondary | ICD-10-CM | POA: Diagnosis present

## 2020-12-05 DIAGNOSIS — J449 Chronic obstructive pulmonary disease, unspecified: Secondary | ICD-10-CM | POA: Diagnosis present

## 2020-12-05 DIAGNOSIS — I2782 Chronic pulmonary embolism: Secondary | ICD-10-CM | POA: Diagnosis present

## 2020-12-05 DIAGNOSIS — R278 Other lack of coordination: Secondary | ICD-10-CM | POA: Diagnosis not present

## 2020-12-05 DIAGNOSIS — I5021 Acute systolic (congestive) heart failure: Secondary | ICD-10-CM | POA: Diagnosis not present

## 2020-12-05 DIAGNOSIS — I130 Hypertensive heart and chronic kidney disease with heart failure and stage 1 through stage 4 chronic kidney disease, or unspecified chronic kidney disease: Secondary | ICD-10-CM | POA: Diagnosis not present

## 2020-12-05 DIAGNOSIS — I878 Other specified disorders of veins: Secondary | ICD-10-CM | POA: Diagnosis not present

## 2020-12-05 DIAGNOSIS — N179 Acute kidney failure, unspecified: Secondary | ICD-10-CM

## 2020-12-05 DIAGNOSIS — I509 Heart failure, unspecified: Secondary | ICD-10-CM

## 2020-12-05 DIAGNOSIS — J9811 Atelectasis: Secondary | ICD-10-CM | POA: Diagnosis not present

## 2020-12-05 DIAGNOSIS — I480 Paroxysmal atrial fibrillation: Secondary | ICD-10-CM | POA: Diagnosis present

## 2020-12-05 DIAGNOSIS — G8101 Flaccid hemiplegia affecting right dominant side: Secondary | ICD-10-CM | POA: Diagnosis not present

## 2020-12-05 DIAGNOSIS — J9621 Acute and chronic respiratory failure with hypoxia: Secondary | ICD-10-CM | POA: Diagnosis present

## 2020-12-05 DIAGNOSIS — Z79899 Other long term (current) drug therapy: Secondary | ICD-10-CM

## 2020-12-05 DIAGNOSIS — Z8249 Family history of ischemic heart disease and other diseases of the circulatory system: Secondary | ICD-10-CM

## 2020-12-05 DIAGNOSIS — N189 Chronic kidney disease, unspecified: Secondary | ICD-10-CM

## 2020-12-05 DIAGNOSIS — Z20822 Contact with and (suspected) exposure to covid-19: Secondary | ICD-10-CM | POA: Diagnosis present

## 2020-12-05 DIAGNOSIS — I110 Hypertensive heart disease with heart failure: Secondary | ICD-10-CM | POA: Diagnosis not present

## 2020-12-05 DIAGNOSIS — I7789 Other specified disorders of arteries and arterioles: Secondary | ICD-10-CM | POA: Diagnosis not present

## 2020-12-05 DIAGNOSIS — I499 Cardiac arrhythmia, unspecified: Secondary | ICD-10-CM | POA: Diagnosis not present

## 2020-12-05 DIAGNOSIS — Z7401 Bed confinement status: Secondary | ICD-10-CM

## 2020-12-05 DIAGNOSIS — R293 Abnormal posture: Secondary | ICD-10-CM | POA: Diagnosis not present

## 2020-12-05 DIAGNOSIS — Z9981 Dependence on supplemental oxygen: Secondary | ICD-10-CM

## 2020-12-05 DIAGNOSIS — I69320 Aphasia following cerebral infarction: Secondary | ICD-10-CM

## 2020-12-05 LAB — CBC WITH DIFFERENTIAL/PLATELET
Abs Immature Granulocytes: 0.02 10*3/uL (ref 0.00–0.07)
Basophils Absolute: 0.1 10*3/uL (ref 0.0–0.1)
Basophils Relative: 1 %
Eosinophils Absolute: 0.1 10*3/uL (ref 0.0–0.5)
Eosinophils Relative: 2 %
HCT: 35.3 % — ABNORMAL LOW (ref 36.0–46.0)
Hemoglobin: 11 g/dL — ABNORMAL LOW (ref 12.0–15.0)
Immature Granulocytes: 0 %
Lymphocytes Relative: 38 %
Lymphs Abs: 2.3 10*3/uL (ref 0.7–4.0)
MCH: 32.2 pg (ref 26.0–34.0)
MCHC: 31.2 g/dL (ref 30.0–36.0)
MCV: 103.2 fL — ABNORMAL HIGH (ref 80.0–100.0)
Monocytes Absolute: 0.6 10*3/uL (ref 0.1–1.0)
Monocytes Relative: 10 %
Neutro Abs: 3 10*3/uL (ref 1.7–7.7)
Neutrophils Relative %: 49 %
Platelets: 168 10*3/uL (ref 150–400)
RBC: 3.42 MIL/uL — ABNORMAL LOW (ref 3.87–5.11)
RDW: 16.7 % — ABNORMAL HIGH (ref 11.5–15.5)
WBC: 6.1 10*3/uL (ref 4.0–10.5)
nRBC: 1.5 % — ABNORMAL HIGH (ref 0.0–0.2)

## 2020-12-05 LAB — COMPREHENSIVE METABOLIC PANEL
ALT: 22 U/L (ref 0–44)
AST: 28 U/L (ref 15–41)
Albumin: 3.2 g/dL — ABNORMAL LOW (ref 3.5–5.0)
Alkaline Phosphatase: 79 U/L (ref 38–126)
Anion gap: 9 (ref 5–15)
BUN: 51 mg/dL — ABNORMAL HIGH (ref 8–23)
CO2: 24 mmol/L (ref 22–32)
Calcium: 8.7 mg/dL — ABNORMAL LOW (ref 8.9–10.3)
Chloride: 103 mmol/L (ref 98–111)
Creatinine, Ser: 1.48 mg/dL — ABNORMAL HIGH (ref 0.44–1.00)
GFR, Estimated: 39 mL/min — ABNORMAL LOW (ref >60)
Glucose, Bld: 96 mg/dL (ref 70–99)
Potassium: 4.2 mmol/L (ref 3.5–5.1)
Sodium: 136 mmol/L (ref 135–145)
Total Bilirubin: 1.6 mg/dL — ABNORMAL HIGH (ref 0.3–1.2)
Total Protein: 6.5 g/dL (ref 6.5–8.1)

## 2020-12-05 LAB — URINALYSIS, ROUTINE W REFLEX MICROSCOPIC
Bilirubin Urine: NEGATIVE
Glucose, UA: NEGATIVE mg/dL
Ketones, ur: NEGATIVE mg/dL
Nitrite: NEGATIVE
Protein, ur: NEGATIVE mg/dL
Specific Gravity, Urine: 1.01 (ref 1.005–1.030)
WBC, UA: >50 WBC/hpf — ABNORMAL HIGH (ref 0–5)
pH: 6 (ref 5.0–8.0)

## 2020-12-05 LAB — RESP PANEL BY RT-PCR (FLU A&B, COVID) ARPGX2
Influenza A by PCR: NEGATIVE
Influenza B by PCR: NEGATIVE
SARS Coronavirus 2 by RT PCR: NEGATIVE

## 2020-12-05 LAB — LACTIC ACID, PLASMA: Lactic Acid, Venous: 1.9 mmol/L (ref 0.5–1.9)

## 2020-12-05 LAB — PROTIME-INR
INR: 2.5 — ABNORMAL HIGH (ref 0.8–1.2)
Prothrombin Time: 27.3 seconds — ABNORMAL HIGH (ref 11.4–15.2)

## 2020-12-05 LAB — APTT: aPTT: 36 seconds (ref 24–36)

## 2020-12-05 LAB — PROCALCITONIN: Procalcitonin: 0.14 ng/mL

## 2020-12-05 LAB — BRAIN NATRIURETIC PEPTIDE: B Natriuretic Peptide: 1947.9 pg/mL — ABNORMAL HIGH (ref 0.0–100.0)

## 2020-12-05 LAB — TROPONIN I (HIGH SENSITIVITY): Troponin I (High Sensitivity): 35 ng/L — ABNORMAL HIGH (ref <18)

## 2020-12-05 MED ORDER — IOHEXOL 350 MG/ML SOLN
80.0000 mL | Freq: Once | INTRAVENOUS | Status: AC | PRN
  Administered 2020-12-05: 80 mL via INTRAVENOUS

## 2020-12-05 MED ORDER — AMIODARONE HCL IN DEXTROSE 360-4.14 MG/200ML-% IV SOLN
60.0000 mg/h | INTRAVENOUS | Status: AC
  Administered 2020-12-05 (×2): 60 mg/h via INTRAVENOUS
  Filled 2020-12-05 (×2): qty 200

## 2020-12-05 MED ORDER — HEPARIN (PORCINE) 25000 UT/250ML-% IV SOLN
1200.0000 [IU]/h | INTRAVENOUS | Status: DC
  Administered 2020-12-05: 1200 [IU]/h via INTRAVENOUS
  Filled 2020-12-05: qty 250

## 2020-12-05 MED ORDER — SODIUM CHLORIDE 0.9 % IV SOLN
1.0000 g | INTRAVENOUS | Status: DC
  Administered 2020-12-06: 1 g via INTRAVENOUS
  Filled 2020-12-05: qty 10

## 2020-12-05 MED ORDER — DIGOXIN 125 MCG PO TABS: 0.1250 mg | ORAL_TABLET | Freq: Every day | ORAL | Status: DC

## 2020-12-05 MED ORDER — FUROSEMIDE 10 MG/ML IJ SOLN
80.0000 mg | Freq: Two times a day (BID) | INTRAMUSCULAR | Status: DC
  Filled 2020-12-05: qty 8

## 2020-12-05 MED ORDER — AMIODARONE HCL IN DEXTROSE 360-4.14 MG/200ML-% IV SOLN
30.0000 mg/h | INTRAVENOUS | Status: DC
  Administered 2020-12-06 (×2): 30 mg/h via INTRAVENOUS
  Filled 2020-12-05 (×2): qty 200

## 2020-12-05 MED ORDER — SODIUM CHLORIDE 0.9 % IV BOLUS
250.0000 mL | Freq: Once | INTRAVENOUS | Status: AC
  Administered 2020-12-05: 250 mL via INTRAVENOUS

## 2020-12-05 MED ORDER — AMIODARONE LOAD VIA INFUSION
150.0000 mg | Freq: Once | INTRAVENOUS | Status: AC
  Administered 2020-12-05: 150 mg via INTRAVENOUS
  Filled 2020-12-05: qty 83.34

## 2020-12-05 MED ORDER — SODIUM CHLORIDE 0.9 % IV SOLN
1.0000 g | Freq: Once | INTRAVENOUS | Status: AC
  Administered 2020-12-05: 1 g via INTRAVENOUS
  Filled 2020-12-05: qty 10

## 2020-12-05 NOTE — Progress Notes (Signed)
ANTICOAGULATION CONSULT NOTE - Initial Consult  Pharmacy Consult for Heparin Indication: atrial fibrillation  Allergies  Allergen Reactions   Coumadin  [Warfarin] Other (See Comments)    Resulted in bleeding into her brain- "Allergic," per MAR   Clonidine Derivatives Rash and Other (See Comments)    "Allergic," per [facility]    Patient Measurements: Height: 5\' 7"  (170.2 cm) Weight: 98.1 kg (216 lb 4.3 oz) IBW/kg (Calculated) : 61.6 Heparin Dosing Weight: 83.3 kg  Vital Signs: Temp: 98.6 F (37 C) (07/15 1518) Temp Source: Rectal (07/15 1518) BP: 96/72 (07/15 2045) Pulse Rate: 114 (07/15 2045)  Labs: Recent Labs    12/16/2020 1336 12/08/2020 1528  HGB 11.0*  --   HCT 35.3*  --   PLT 168  --   APTT  --  36  LABPROT  --  27.3*  INR  --  2.5*  CREATININE  --  1.48*  TROPONINIHS  --  35*    Estimated Creatinine Clearance: 44.4 mL/min (A) (by C-G formula based on SCr of 1.48 mg/dL (H)).   Medical History: Past Medical History:  Diagnosis Date   Atrial fibrillation ([place]) 11/11/2014   Carpal tunnel syndrome 11/11/2014   Congestive heart failure ([place]) 11/11/2014   COPD (chronic obstructive pulmonary disease) ([place]) 11/11/2014   Depression 11/11/2014   Essential hypertension 11/11/2014   History of pituitary tumor 11/11/2014   Hypopotassemia 11/11/2014   Osteoarthritis 11/11/2014   Thrombocytopenia ([place]) 11/11/2014    Medications:  (Not in a hospital admission)  Scheduled:   digoxin  0.125 mg Oral Daily   furosemide  80 mg Intravenous BID   Infusions:   amiodarone 60 mg/hr (12/01/2020 1856)   Followed by   amiodarone     [START ON 11/27/2020] cefTRIAXone (ROCEPHIN)  IV      Assessment: 52 yof with history of recent CVA (0/4888), systolic heart failure (EF 20%, paroxysmal atrial fibrillation on Eliquis, hypertension, thrombocytopenia, COPD.  Heparin initiated for AF with RVR as patient is too lethargic to take PO Eliquis. Patient's last dose of eliquis prior to arrival was  7/15 in the AM. Will need to utilize aPTT monitoring until aPTT and heparin level are correlated.  Baseline CBC is stable. Baseline aPTT 36. Baseline PT INR is 27.3 and 2.5, respectively.  Goal of Therapy:  Heparin level 0.3-0.7 units/ml aPTT 66-102 seconds Monitor platelets by anticoagulation protocol: Yes   Plan:  Start heparin infusion at 1200 units/hr Check aPTT & anti-Xa level in 8 hours and daily while on heparin - continue aPTT and anti-Xa monitoring until levels correlated Continue to monitor H&H and platelets  Heloise Purpura 11/28/2020,9:28 PM

## 2020-12-05 NOTE — ED Provider Notes (Signed)
  Physical Exam  BP 90/69   Pulse (!) 115   Temp 98.6 F (37 C) (Rectal)   Resp (!) 54   Ht 5\' 7"  (1.702 m)   Wt 98.1 kg   SpO2 99%   BMI 33.87 kg/m   Physical Exam  ED Course/Procedures     Procedures  MDM  Care assumed at 4 pm.  Patient recently had a stroke and only answers yes or no questions at baseline.  Patient apparently stopped answering questions today.  Patient was found to be hypotensive and tachycardic and in rapid A. fib.  Unfortunately she has a EF of 20%.  CT head showed no bleed.  Signout pending CT chest abdomen pelvis and lab work and urinalysis  5 pm Patient's urinalysis is positive for UTI.  Patient was given Rocephin.  She also has mild AKI and elevated BNP.  I consulted Dr. Davina Poke from cardiology.  He recommended amiodarone.  7:17 PM Cardiology saw patient had discussion with family.  Given poor prognosis, they are interested in hospice care at this point.  Patient is DNR.  Patient is started on amiodarone drip and blood pressure still in the 90s.  He plans to loaded with dig as well.  CT chest abdomen pelvis were unremarkable.  At this point hospitalist will admit.  Likely will need to have palliative care on board to have detailed discussion about goals of care.   CRITICAL CARE Performed by: Wandra Arthurs   Total critical care time: 30 minutes  Critical care time was exclusive of separately billable procedures and treating other patients.  Critical care was necessary to treat or prevent imminent or life-threatening deterioration.  Critical care was time spent personally by me on the following activities: development of treatment plan with patient and/or surrogate as well as nursing, discussions with consultants, evaluation of patient's response to treatment, examination of patient, obtaining history from patient or surrogate, ordering and performing treatments and interventions, ordering and review of laboratory studies, ordering and review of  radiographic studies, pulse oximetry and re-evaluation of patient's condition.       Drenda Freeze, MD 11/24/2020 [phone]

## 2020-12-05 NOTE — ED Notes (Signed)
Pt sister [name] called and updated about pt

## 2020-12-05 NOTE — Consult Note (Addendum)
Cardiology Consultation:  Patient ID: Angelica Strickland MRN: [id number]; DOB: [birth year]  Admit date: 12/04/2020 Date of Consult: 12/02/2020  Primary Care Provider: Birdie Sons, MD Primary Cardiologist: Dr. Satira Anis Jefm Bryant)  Patient Profile:  Angelica Strickland is a 65 y.o. 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  History of Present Illness:  Angelica Strickland presents with shortness of breath and difficulty breathing.  She is bedridden and nonverbal.  This is secondary to her recent stroke.  She was brought here by EMS from her nursing home.  She is currently DNR.  Cardiology was consulted for atrial fibrillation with RVR and hypotension.  There are concerns for sepsis.  She was recently admitted to [facility] in May for an acute ischemic stroke of the left MCA as well as left M1.  She apparently had several deficits from this including an expressive aphasia.  She is bedridden per reports from emergency room staff and per documentation in the chart.  She cannot communicate with me.  She can only moan and grimace.  She is unable to follow commands or answer questions.  Her stroke was attributed to atrial fibrillation.  She was started on Eliquis at that admission and discharged to a skilled nursing facility.  On arrival she was noted to be hypotensive 80/66.  In A. fib with RVR with rates up in the 140s.  Respirations were noted to be increased with tachypnea.  Labs notable for AKI with creatinine up to 1.48 from baseline around 1.0.  BNP 1947. Lactic acid 1.9. Troponin 35.  WBC 6.1, hemoglobin 11.0, platelets 168.  EKG demonstrated atrial fibrillation with heart rate 113, nonspecific ST-T changes.  CT head demonstrates prior large MCA infarct.  She also has a large left frontal and temporal lobes  infarct.  These appear to be chronic.  She has chronic lacunar infarcts.  She has had multiple chronic infarcts in the bilateral cerebellar hemispheres.  She follows with the [facility] in [place].  She follows with Dr. Ubaldo Glassing.  It appears [name] has a longstanding history of a dilated cardiomyopathy with a nonischemic etiology.  They mention a normal left heart catheterization.  She has not been on anticoagulation for her atrial fibrillation due to thrombocytopenia.  Platelets here have been normal.  She was placed on Eliquis at the time of her most recent stroke in June of this year.  She has been on minimal guideline directed medical therapy.  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  I had a discussion with her sister Angelica Strickland [phone]  She informed me that [name] has been living in the [facility] skilled nursing facility.  Apparently she went there for rehabilitation but Ms. Mccollister has not responded to physical therapy.  She is bedbound and nonverbal.  She is DNR.  I did have a discussion with her current state which is complicated by hypotension and A. fib with RVR.  I fear she may be an low output congestive heart failure.  She does not appear in overt shock as her lactic acid is normal.  I did discuss with her that she is not a candidate for aggressive cardiovascular interventions.  She reports that she would not want this for her sister anyway.  I informed her that she likely is more hospice appropriate.  She informs me that she is interested in this.  She is not wanting aggressive cardiovascular care for her nor is she wanting aggressive ICU care.  Heart Pathway Score:       Past Medical History: Past Medical History:  Diagnosis Date   Atrial fibrillation ([place]) 11/11/2014   Carpal tunnel syndrome 11/11/2014   Congestive heart failure ([place]) 11/11/2014   COPD (chronic obstructive pulmonary disease) ([place]) 11/11/2014   Depression 11/11/2014   Essential  hypertension 11/11/2014   History of pituitary tumor 11/11/2014   Hypopotassemia 11/11/2014   Osteoarthritis 11/11/2014   Thrombocytopenia ([place]) 11/11/2014    Past Surgical History: Past Surgical History:  Procedure Laterality Date   CARDIAC CATHETERIZATION     PITUITARY SURGERY     UTERINE FIBROID SURGERY       Home Medications:  Prior to Admission medications   Medication Sig Start Date End Date Taking? Authorizing Provider  albuterol (PROVENTIL HFA;VENTOLIN HFA) 108 (90 Base) MCG/ACT inhaler Inhale 1 puff into the lungs every 6 (six) hours as needed for wheezing or shortness of breath. 02/27/16   Trinna Post, PA-C  apixaban (ELIQUIS) 5 MG TABS tablet Take 1 tablet (5 mg total) by mouth 2 (two) times daily. 10/31/20 10/31/21  Pokhrel, Corrie Mckusick, MD  atorvastatin (LIPITOR) 40 MG tablet Take 1 tablet (40 mg total) by mouth daily. 11/01/20   Pokhrel, Corrie Mckusick, MD  carvedilol (COREG) 12.5 MG tablet Take 1 tablet (12.5 mg total) by mouth 2 (two) times daily with a meal. Hold for systolic blood pressure less than 110 and HR<60. 10/31/20   Pokhrel, Laxman, MD  lisinopril (ZESTRIL) 2.5 MG tablet Take 1 tablet (2.5 mg total) by mouth daily. HOLD FOR SBP<110 10/31/20   Pokhrel, Corrie Mckusick, MD  methylphenidate (RITALIN) 5 MG tablet Take 1 tablet (5 mg total) by mouth 2 (two) times daily with breakfast and lunch. 11/01/20   Pokhrel, Corrie Mckusick, MD  spironolactone (ALDACTONE) 25 MG tablet Take 1 tablet (25 mg total) by mouth daily. Hold for Systolic blood pressure less than 110 10/31/20   Pokhrel, Laxman, MD  torsemide (DEMADEX) 20 MG tablet Take 2 tablets (40 mg total) by mouth daily. 10/31/20   Pokhrel, Corrie Mckusick, MD    Inpatient Medications: Scheduled Meds:  amiodarone  150 mg Intravenous Once   Continuous Infusions:  amiodarone     Followed by   amiodarone     PRN Meds:   Allergies:    Allergies  Allergen Reactions   Coumadin  [Warfarin] Other (See Comments)    Resulted in bleeding into her brain    Clonidine Derivatives Rash    Social History:   Social History   Socioeconomic History   Marital status: Single    Spouse name: Not on file   Number of children: Not on file   Years of education: Not on file   Highest education level: Not on file  Occupational History   Not on file  Tobacco Use   Smoking status: Former    Types: Cigarettes    Quit date: 08/23/2010    Years since quitting: 10.2   Smokeless tobacco: Never   Tobacco comments:    Previously <1 ppd for about 4 yearsl.   Substance and Sexual Activity   Alcohol use: No   Drug use: No   Sexual activity: Not on file  Other Topics Concern   Not on file  Social History Narrative   Not on file   Social Determinants of Health   Financial Resource Strain:  Not on file  Food Insecurity: Not on file  Transportation Needs: Not on file  Physical Activity: Not on file  Stress: Not on file  Social Connections: Not on file  Intimate Partner Violence: Not on file     Family History:    Family History  Problem Relation Age of Onset   Hypertension Mother    Stroke Mother    Alzheimer's disease Mother    CAD Father    Hypertension Father    Healthy Sister    Breast cancer Sister    Healthy Sister      ROS:  All other ROS reviewed and negative. Pertinent positives noted in the HPI.     Physical Exam/Data:   Vitals:   12/21/2020 1631 12/01/2020 1645 12/02/2020 1715 12/19/2020 1730  BP: (!) 86/66 100/83 97/72 93/74   Pulse: (!) 110 (!) 138 96 (!) 48  Resp: (!) 33 (!) 27 (!) 41 19  Temp:      TempSrc:      SpO2: 100% 100% 100% 100%  Weight:      Height:        Intake/Output Summary (Last 24 hours) at 12/20/2020 1732 Last data filed at 12/09/2020 1719 Gross per 24 hour  Intake 600 ml  Output 100 ml  Net 500 ml    Last 3 Weights 11/26/2020 10/26/2020 10/25/2020  Weight (lbs) 216 lb 4.3 oz 216 lb 4.3 oz 210 lb 5.1 oz  Weight (kg) 98.1 kg 98.1 kg 95.4 kg    Body mass index is 33.87 kg/m.  General: Ill-appearing Head:  Atraumatic, normal size  Eyes: PEERLA, EOMI  Neck: Supple, JVD 10 to 12 cm of water Endocrine: No thryomegaly Cardiac: Normal S1, S2; irregular rhythm, 3 out of 6 holosystolic murmur Lungs: Diminished breath sounds bilaterally Abd: Soft, nontender, no hepatomegaly  Ext: 1+ pitting edema Musculoskeletal: No deformities Skin: Warm and dry, no rashes   Neuro: Alert, awake, will not follow commands or answer questions, does track  EKG:  The EKG was personally reviewed and demonstrates: Atrial fibrillation heart rate 113, intermittent aberrant conduction  Telemetry:  Telemetry was personally reviewed and demonstrates: Atrial fibrillation heart rate 120-130 bpm  Relevant CV Studies: TTE 10/19/2020  1. Left ventricular ejection fraction, by estimation, is 20 to 25%. The  left ventricle has severely decreased function. The left ventricle  demonstrates global hypokinesis. The left ventricular internal cavity size  was severely dilated. Left ventricular  diastolic function could not be evaluated. Elevated left ventricular  end-diastolic pressure.   2. Right ventricular systolic function is mildly reduced. The right  ventricular size is mildly enlarged. There is moderately elevated  pulmonary artery systolic pressure.   3. Left atrial size was severely dilated.   4. Right atrial size was severely dilated.   5. The mitral valve is abnormal. Severe mitral valve regurgitation.   6. Tricuspid valve regurgitation is moderate.   7. The aortic valve is tricuspid. Aortic valve regurgitation is moderate  to severe.   8. The inferior vena cava is normal in size with <50% respiratory  variability, suggesting right atrial pressure of 8 mmHg.   Laboratory Data: High Sensitivity Troponin:   Recent Labs  Lab 11/21/2020 1528  TROPONINIHS 35*     Cardiac EnzymesNo results for input(s): TROPONINI in the last 168 hours. No results for input(s): TROPIPOC in the last 168 hours.  Chemistry Recent Labs  Lab  12/09/2020 1528  NA 136  K 4.2  CL 103  CO2 24  GLUCOSE 96  BUN 51*  CREATININE 1.48*  CALCIUM 8.7*  GFRNONAA 39*  ANIONGAP 9    Recent Labs  Lab 12/15/2020 1528  PROT 6.5  ALBUMIN 3.2*  AST 28  ALT 22  ALKPHOS 79  BILITOT 1.6*   Hematology Recent Labs  Lab 12/01/2020 1336  WBC 6.1  RBC 3.42*  HGB 11.0*  HCT 35.3*  MCV 103.2*  MCH 32.2  MCHC 31.2  RDW 16.7*  PLT 168   BNP Recent Labs  Lab 12/18/2020 1528  BNP 1,947.9*    DDimer No results for input(s): DDIMER in the last 168 hours.  Radiology/Studies:  CT Head Wo Contrast  Result Date: 12/18/2020 CLINICAL DATA:  Provided history: Delirium. Additional history provided: Aphasia, uneven pupils. EXAM: CT HEAD WITHOUT CONTRAST TECHNIQUE: Contiguous axial images were obtained from the base of the skull through the vertex without intravenous contrast. COMPARISON:  Noncontrast head CT 10/17/2020. MRI/MRA head 10/18/2020. CT angiogram head/neck 10/18/2020. FINDINGS: Brain: Mild generalized cerebral and cerebellar atrophy. Redemonstrated large chronic infarct within the left frontal lobe, superior left temporal lobe, left insula and left basal ganglia. An additional moderate-sized acute cortical/subcortical left MCA territory infarct within the left temporoparietal junction has increased in extent as compared to prior examinations, and appears subacute or chronic. Background mild patchy and ill-defined hypoattenuation within the cerebral white matter, nonspecific but compatible chronic small vessel ischemic disease. Redemonstrated chronic lacunar infarcts within the pons. Redemonstration of multiple chronic infarcts within the bilateral cerebellar hemispheres. There is no acute intracranial hemorrhage. No extra-axial fluid collection. No evidence of an intracranial mass. No midline shift. Vascular: No hyperdense vessel.  Atherosclerotic calcifications. Skull: Normal. Negative for fracture or focal lesion. Sinuses/Orbits: Visualized  orbits show no acute finding. Redemonstrated chronic bilateral medially displaced fracture deformities of the lamina papyracea. Redemonstrated chronic nasal bone fracture deformities. No significant paranasal sinus disease at the imaged levels. IMPRESSION: Moderate-sized left MCA territory cortical/subcortical infarct within the left temporoparietal junction, increased in extent as compared to the prior brain MRI of 10/10/2020, but subacute to chronic in appearance. Additional large chronic left MCA territory infarct within the left frontal and temporal lobes as well as left insula and left basal ganglia, not significantly changed in extent as compared to the prior MRI. Background mild generalized parenchymal atrophy and cerebral white matter chronic small vessel ischemic disease. Redemonstrated chronic lacunar infarcts within the pons. Redemonstration of multiple chronic infarcts within bilateral cerebellar hemispheres. Electronically Signed   By: Kellie Simmering DO   On: 12/12/2020 14:51   DG Chest Port 1 View  Result Date: 12/11/2020 CLINICAL DATA:  Shortness of breath. EXAM: PORTABLE CHEST 1 VIEW COMPARISON:  Single-view of the chest 10/29/2020. PA and lateral chest 03/02/2013. FINDINGS: There is marked enlargement of the cardiopericardial silhouette consistent with cardiomegaly and possible pericardial effusion. Lungs are clear. Aortic atherosclerosis. No pneumothorax or pleural effusion. IMPRESSION: Marked enlargement of the cardiopericardial silhouette consistent with cardiomegaly and possibly a pericardial effusion. Lungs clear. Aortic Atherosclerosis (ICD10-I70.0). Electronically Signed   By: Inge Rise M.D.   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 fib with RVR. She has a narrow  pulse pressure.  EF is known to be around 20%.  -I did review records from her primary cardiologist at the [facility].  She has a longstanding history of a dilated  nonischemic cardiomyopathy. -I did review her echocardiogram which was obtained at the time of her most recent stroke.  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  This could predispose her to stroke in addition to A. fib.  I do agree with anticoagulation. -She is bedridden and cannot communicate due to prior strokes.  She unfortunately is not a candidate for advanced therapies.  All that could be offered would be short-term pressors and inotropes, but this is of limited value for this patient. I did have a long discussion with her sister Angelica Strickland.  Ms. Grasse is DNR.  She is not wanting aggressive medical interventions for her sister.  Ms. Scobey is bedridden and unable to communicate.  I have recommended to consider hospice care.  She reports she is interested in this. -For now given that her lactate is normal I see no need for inotropes.  Again I do not think this is in her best interest.  Would recommend admission to the hospitalist service to work-up for possible sepsis and work-up of her hypotension.  Again this may end up being end-stage low output heart failure however it could be an infection.  I have ordered procalcitonin's. -She is hypotensive with A. fib with RVR.  Given her low EF I would recommend amiodarone to try to control her rates.  I would not treat her rates aggressively.  -She does have a slight AKI.  Liver enzymes are normal.  Lactic acid is normal.  She is not in shock. -Would hold beta-blocker, ACE/ARB/Arni/MRA for now. -I would recommend now is digoxin 0.125 mg daily to help with cardiac output.  We will need to be cautious with this given her AKI.  Hopefully this can help her blood pressure.  Again would hold all heart failure medications.  -We will also attempt diuresis.  We will start with 80 mg  IV twice daily.  Hopefully this will improve her symptoms.  2.  LV noncompaction, EF 20% -Per discussion with her sister she has had a dilated cardiomyopathy for 20 years.  I think this is more telling that she actually has LV noncompaction.  I suspect this and A. fib expose her to stroke.  Limited options as detailed above.   For questions or updates, please contact [place] Please consult [email or web address] for contact info under   Signed, [place] T. Audie Box, MD, [place]  11/26/2020 5:32 PM

## 2020-12-05 NOTE — ED Provider Notes (Signed)
[facility] EMERGENCY DEPARTMENT Provider Note   CSN: [id number] Arrival date & time: 12/02/2020  1334     History Chief Complaint  Patient presents with   Altered Mental Status    Angelica Strickland is a 65 y.o. female.  65 yo F with a chief complaints of difficulty breathing.  EMS noticed in route that she was in A. fib with RVR.  They felt like her breath sounds were slightly diminished.  Reportedly is at her neurologic baseline.  Patient is nonverbal.  Level 5 caveat   Altered Mental Status     Past Medical History:  Diagnosis Date   Atrial fibrillation ([place]) 11/11/2014   Carpal tunnel syndrome 11/11/2014   Congestive heart failure ([place]) 11/11/2014   COPD (chronic obstructive pulmonary disease) ([place]) 11/11/2014   Depression 11/11/2014   Essential hypertension 11/11/2014   History of pituitary tumor 11/11/2014   Hypopotassemia 11/11/2014   Osteoarthritis 11/11/2014   Thrombocytopenia ([place]) 11/11/2014    Patient Active Problem List   Diagnosis Date Noted   Acute ischemic stroke ([place]) 64/33/2951   Chronic systolic CHF (congestive heart failure) ([place]) 10/18/2020   History of smoking 30 or more pack years 04/27/2016   Body mass index (BMI) of 26.0-26.9 in adult 04/10/2015   CCF (congestive cardiac failure) ([place]) 04/10/2015   H/O neoplasm 04/10/2015   Atrial fibrillation ([place]) 11/11/2014   Depression 11/11/2014   Essential hypertension 11/11/2014   COPD (chronic obstructive pulmonary disease) ([place]) 11/11/2014   Osteoarthritis 11/11/2014   Thrombocytopenia ([place]) 11/11/2014   Carpal tunnel syndrome 11/11/2014   History of pituitary tumor 11/11/2014   Abnormal CAT scan 01/07/2012   Cardiomyopathy ([place]) 01/07/2012   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     Family History  Problem Relation Age of Onset   Hypertension Mother    Stroke Mother    Alzheimer's disease Mother    CAD Father    Hypertension Father    Healthy Sister    Breast cancer Sister    Healthy Sister     Social History   Tobacco Use   Smoking status: Former    Types: Cigarettes    Quit date: 08/23/2010    Years since quitting: 10.2   Smokeless tobacco: Never   Tobacco comments:    Previously <1 ppd for about 4 yearsl.   Substance Use Topics   Alcohol use: No   Drug use: No    Home Medications Prior to Admission medications   Medication Sig Start Date End Date Taking? Authorizing Provider  albuterol (PROVENTIL HFA;VENTOLIN HFA) 108 (90 Base) MCG/ACT inhaler Inhale 1 puff into the lungs every 6 (six) hours as needed for wheezing or shortness of breath. 02/27/16   Trinna Post, PA-C  apixaban (ELIQUIS) 5 MG TABS tablet Take 1 tablet (5 mg total) by mouth 2 (two) times daily. 10/31/20 10/31/21  Pokhrel, Corrie Mckusick, MD  atorvastatin (LIPITOR) 40 MG tablet Take 1 tablet (40 mg total) by mouth daily. 11/01/20   Pokhrel, Corrie Mckusick, MD  carvedilol (COREG) 12.5 MG tablet Take 1 tablet (12.5 mg total) by mouth 2 (two) times daily with a meal. Hold for systolic blood pressure less than 110 and HR<60. 10/31/20  Pokhrel, Laxman, MD  lisinopril (ZESTRIL) 2.5 MG tablet Take 1 tablet (2.5 mg total) by mouth daily. HOLD FOR SBP<110 10/31/20   Pokhrel, Corrie Mckusick, MD  methylphenidate (RITALIN) 5 MG tablet Take 1 tablet (5 mg total) by mouth 2 (two) times daily with breakfast and lunch. 11/01/20   Pokhrel, Corrie Mckusick, MD  spironolactone (ALDACTONE) 25 MG tablet Take 1 tablet (25 mg total) by mouth daily. Hold for Systolic blood pressure less than 110 10/31/20   Pokhrel, Laxman, MD  torsemide (DEMADEX) 20 MG tablet Take 2 tablets (40 mg total) by mouth daily. 10/31/20   Pokhrel, Corrie Mckusick, MD    Allergies    Coumadin  [warfarin] and Clonidine derivatives  Review of  Systems   Review of Systems  Unable to perform ROS: Patient nonverbal   Physical Exam Updated Vital Signs There were no vitals taken for this visit.  Physical Exam Vitals and nursing note reviewed.  Constitutional:      General: She is not in acute distress.    Appearance: She is well-developed. She is not diaphoretic.  HENT:     Head: Normocephalic and atraumatic.  Eyes:     Comments: Asymmetric pupils left 8 mm right 4.  Cardiovascular:     Rate and Rhythm: Normal rate and regular rhythm.     Heart sounds: No murmur heard.   No friction rub. No gallop.  Pulmonary:     Effort: Pulmonary effort is normal.     Breath sounds: No wheezing or rales.  Abdominal:     General: There is no distension.     Palpations: Abdomen is soft.     Tenderness: There is no abdominal tenderness.  Musculoskeletal:        General: No tenderness.     Cervical back: Normal range of motion and neck supple.  Skin:    General: Skin is warm and dry.  Neurological:     Mental Status: She is alert.     Comments: Follows commands looks around the room actively tracks me. Does not speak.  Right-sided weakness compared to left.  Does not move either leg.    ED Results / Procedures / Treatments   Labs (all labs ordered are listed, but only abnormal results are displayed) Labs Reviewed  URINE CULTURE  CULTURE, BLOOD (ROUTINE X 2)  CULTURE, BLOOD (ROUTINE X 2)  LACTIC ACID, PLASMA  LACTIC ACID, PLASMA  COMPREHENSIVE METABOLIC PANEL  CBC WITH DIFFERENTIAL/PLATELET  PROTIME-INR  APTT  URINALYSIS, ROUTINE W REFLEX MICROSCOPIC    EKG None  Radiology No results found.  Procedures Procedures   Medications Ordered in ED Medications - No data to display  ED Course  I have reviewed the triage vital signs and the nursing notes.  Pertinent labs & imaging results that were available during my care of the patient were reviewed by me and considered in my medical decision making (see chart for  details).    MDM Rules/Calculators/A&P                          65 yo F with a chief complaints of shortness of breath.  Found to be in A. fib with RVR by EMS.  Patient is nonverbal and some limited history.  Cool to the touch diffusely.  Will obtain rectal temperature screen for sepsis CT of the head as she was recently hospitalized with a stroke.  CT head with evolving stroke.  Spoke with Dr. Maurine Minister, neuro, no other  intervention required.    Awaiting labs.    Signed out to Dr. Darl Householder, please see their note for further details of care in the ED.   The patients results and plan were reviewed and discussed.   Any x-rays performed were independently reviewed by myself.   Differential diagnosis were considered with the presenting HPI.  Medications  sodium chloride 0.9 % bolus 250 mL (has no administration in time range)    Vitals:   12/09/2020 1518 12/18/2020 1530 12/03/2020 1545 12/17/2020 1600  BP:  120/76 94/69 92/70   Pulse:  (!) 103  (!) 108  Resp:  (!) 0 17 12  Temp: 98.6 F (37 C)     TempSrc: Rectal     SpO2:  100%  100%  Weight:      Height:        Final diagnoses:  None    Admission/ observation were discussed with the admitting physician, patient and/or family and they are comfortable with the plan.   Final Clinical Impression(s) / ED Diagnoses Final diagnoses:  None    Rx / DC Orders ED Discharge Orders     None        Deno Etienne, DO 12/16/2020 1623

## 2020-12-05 NOTE — Progress Notes (Signed)
   12/03/2020 2300  Assess: MEWS Score  Temp (!) 97.5 F (36.4 C)  BP (!) 89/72  Pulse Rate (!) 106  ECG Heart Rate (!) 111  Resp (!) 22  SpO2 100 %  Assess: MEWS Score  MEWS Temp 0  MEWS Systolic 1  MEWS Pulse 2  MEWS RR 1  MEWS LOC 0  MEWS Score 4  MEWS Score Color Red  Assess: if the MEWS score is Yellow or Red  Were vital signs taken at a resting state? Yes  Focused Assessment No change from prior assessment  Early Detection of Sepsis Score *See Row Information* High  MEWS guidelines implemented *See Row Information* No, previously red, continue vital signs every 4 hours  Treat  MEWS Interventions Administered scheduled meds/treatments  Pain Scale Faces  Faces Pain Scale 4  Escalate  MEWS: Escalate Red: discuss with charge nurse/RN and provider, consider discussing with RRT  Notify: Charge Nurse/RN  Name of Charge Nurse/RN Notified [place] RN  Date Charge Nurse/RN Notified 11/27/2020  Time Charge Nurse/RN Notified 2300  Notify: Provider  Notification Reason  (MEWS protocol)  Document  Patient Outcome Not stable and remains on department  Progress note created (see row info) Yes

## 2020-12-05 NOTE — H&P (Addendum)
History and Physical    Angelica Strickland GBT:[id number] DOB: 19-Apr-1956 DOA: 11/26/2020  PCP: Birdie Sons, MD  Patient coming from: [facility]  I have personally briefly reviewed patient's old medical records in [place]  Chief Complaint: AMS  HPI: Angelica Strickland is a 65 y.o. female with medical history significant for hx of recent CVA (11/1060), systolic heart failure (EF 20%, paroxysmal atrial fibrillation on Eliquis, hypertension, thrombocytopenia, COPD who presents from skilled nursing facility for concerns of altered mental status.  Patient was sent to SNF following an admission in June for CVA with expressive aphasia. Patient can only moan and grimace at time of my evaluation.  Reportedly per ED documentation, patient at baseline can answer yes or no questions. However, today she stopped answering questions and was noted to be less responsive with hypotension and tachycardia.  On arrival to the ED she was noted to be hypotensive in the 90/50s in atrial fibrillation with RVR with rates ranging from 130-170 and with tachypnea.  BNP of 1947.  Troponin of 35.  Creatinine elevated at 1.48 from a prior of 0.98.  No leukocytosis or anemia.  UA with large leukocyte and negative nitrite with rare bacteria.  She was given a dose of IV Rocephin for presumed UTI.  CT head redemonstrated prior old left MCA territory stroke.  CTA chest showed no acute pulmonary embolism but had small volume chronic wall adherent thrombus to several segments of the right and left pulmonary artery.  CT abdomen pelvis showed no acute intra-abdominal process but had large stool ball in the rectum.  Cardiology was consulted and believes patient is likely in low output heart failure.  She has been initiated on amiodarone infusion with plans to load with digoxin and receive IV diuresis.  Family is aware of her poor prognosis and is interested in hospice care at this time.  ED physician has notified palliative  care who will see her tomorrow. Hospitalist called for admission.  Review of Systems: Unable to obtain as patient is lethargic and nonverbal at this time   Past Medical History:  Diagnosis Date   Atrial fibrillation ([place]) 11/11/2014   Carpal tunnel syndrome 11/11/2014   Congestive heart failure ([place]) 11/11/2014   COPD (chronic obstructive pulmonary disease) ([place]) 11/11/2014   Depression 11/11/2014   Essential hypertension 11/11/2014   History of pituitary tumor 11/11/2014   Hypopotassemia 11/11/2014   Osteoarthritis 11/11/2014   Thrombocytopenia ([place]) 11/11/2014    Past Surgical History:  Procedure Laterality Date   CARDIAC CATHETERIZATION     PITUITARY SURGERY     UTERINE FIBROID SURGERY       reports that she quit smoking about 10 years ago. She has never used smokeless tobacco. She reports that she does not drink alcohol and does not use drugs. Social History  Allergies  Allergen Reactions   Coumadin  [Warfarin] Other (See Comments)    Resulted in bleeding into her brain- "Allergic," per [facility]   Clonidine Derivatives Rash and Other (See Comments)    "Allergic," per [facility]    Family History  Problem Relation Age of Onset   Hypertension Mother    Stroke Mother    Alzheimer's disease Mother    CAD Father    Hypertension Father    Healthy Sister    Breast cancer Sister    Healthy Sister      Prior to Admission medications   Medication Sig Start Date End Date Taking? Authorizing Provider  acetaminophen (TYLENOL) 500  MG tablet Take 1,000 mg by mouth in the morning.   Yes [provider]  albuterol (PROVENTIL HFA;VENTOLIN HFA) 108 (90 Base) MCG/ACT inhaler Inhale 1 puff into the lungs every 6 (six) hours as needed for wheezing or shortness of breath. 02/27/16  Yes Carles Collet M, PA-C  albuterol (PROVENTIL) (2.5 MG/3ML) 0.083% nebulizer solution Take 2.5 mg by nebulization 2 (two) times daily.   Yes [provider]  apixaban (ELIQUIS) 5 MG TABS tablet Take 1  tablet (5 mg total) by mouth 2 (two) times daily. 10/31/20 10/31/21 Yes Pokhrel, Laxman, MD  atorvastatin (LIPITOR) 40 MG tablet Take 1 tablet (40 mg total) by mouth daily. Patient taking differently: Take 40 mg by mouth at bedtime. 11/01/20  Yes Pokhrel, Corrie Mckusick, MD  B Complex CAPS Take 1 capsule by mouth daily with breakfast.   Yes [provider]  carvedilol (COREG) 12.5 MG tablet Take 1 tablet (12.5 mg total) by mouth 2 (two) times daily with a meal. Hold for systolic blood pressure less than 110 and HR<60. Patient taking differently: Take 12.5 mg by mouth See admin instructions. Take 12.5 mg by mouth two times a day with food and hold for a Systolic reading of <681 and a heart rate of <60 10/31/20  Yes Pokhrel, Laxman, MD  gabapentin (NEURONTIN) 100 MG capsule Take 100 mg by mouth at bedtime.   Yes [provider]  lisinopril (ZESTRIL) 2.5 MG tablet Take 1 tablet (2.5 mg total) by mouth daily. HOLD FOR SBP<110 Patient taking differently: Take 2.5 mg by mouth See admin instructions. Take 2.5 mg by mouth once a day and hold for a Systolic reading of <275 1/70/01  Yes Pokhrel, Laxman, MD  methylphenidate (RITALIN) 5 MG tablet Take 1 tablet (5 mg total) by mouth 2 (two) times daily with breakfast and lunch. Patient taking differently: Take 5 mg by mouth See admin instructions. Take 5 mg by mouth with breakfast and lunch and hold if sedated 11/01/20  Yes Pokhrel, Laxman, MD  Nutritional Supplements (RESOURCE PO) Take 120 mLs by mouth 3 (three) times daily.   Yes [provider]  OXYGEN Inhale 2 L/min into the lungs as needed (every shift- for SATS <91%).   Yes [provider]  spironolactone (ALDACTONE) 25 MG tablet Take 1 tablet (25 mg total) by mouth daily. Hold for Systolic blood pressure less than 110 Patient taking differently: Take 25 mg by mouth See admin instructions. Take 25 mg by mouth once a day and hold for a Systolic reading of <749 4/49/67  Yes Pokhrel,  Laxman, MD  torsemide (DEMADEX) 20 MG tablet Take 2 tablets (40 mg total) by mouth daily. Patient taking differently: Take 20 mg by mouth in the morning. 10/31/20  Yes Flora Lipps, MD    Physical Exam: Vitals:   11/22/2020 2004 12/19/2020 2015 11/29/2020 2030 12/10/2020 2045  BP:  (!) 86/72 (!) 84/66 96/72  Pulse: 95 (!) 111 (!) 101 (!) 114  Resp: (!) 29 18 (!) 33 (!) 26  Temp:      TempSrc:      SpO2: 93% 96% 98% 94%  Weight:      Height:        Constitutional: NAD, ill-appearing lethargic elderly female laying flat in bed with eyes closed Vitals:   12/14/2020 2004 11/21/2020 2015 11/28/2020 2030 11/23/2020 2045  BP:  (!) 86/72 (!) 84/66 96/72  Pulse: 95 (!) 111 (!) 101 (!) 114  Resp: (!) 29 18 (!) 33 (!) 26  Temp:      TempSrc:      SpO2: 93% 96% 98% 94%  Weight:      Height:       Eyes: PERRL, lids and conjunctivae normal ENMT: Mucous membranes are moist.  Neck: normal, supple Respiratory: clear to auscultation bilaterally, no wheezing, no crackles. Tachypnea with labored breathing at times. Cardiovascular: Irregularly irregular rate and rhythm, no murmurs / rubs / gallops. No extremity edema. 2+ pedal pulses.  Abdomen: Moans and grimaces with light palpation throughout  musculoskeletal: no clubbing / cyanosis. No joint deformity upper and lower extremities. Normal muscle tone.  Skin: no rashes, lesions, ulcers. No induration Neurologic: Patient will periodically open her eyes spontaneously but does not track.  Does not follow any commands. Psychiatric: Lethargic    Labs on Admission: I have personally reviewed following labs and imaging studies  CBC: Recent Labs  Lab 12/14/2020 1336  WBC 6.1  NEUTROABS 3.0  HGB 11.0*  HCT 35.3*  MCV 103.2*  PLT 614   Basic Metabolic Panel: Recent Labs  Lab 11/29/2020 1528  NA 136  K 4.2  CL 103  CO2 24  GLUCOSE 96  BUN 51*  CREATININE 1.48*  CALCIUM 8.7*   GFR: Estimated Creatinine Clearance: 44.4 mL/min (A) (by C-G formula  based on SCr of 1.48 mg/dL (H)). Liver Function Tests: Recent Labs  Lab 12/14/2020 1528  AST 28  ALT 22  ALKPHOS 79  BILITOT 1.6*  PROT 6.5  ALBUMIN 3.2*   No results for input(s): LIPASE, AMYLASE in the last 168 hours. No results for input(s): AMMONIA in the last 168 hours. Coagulation Profile: Recent Labs  Lab 11/23/2020 1528  INR 2.5*   Cardiac Enzymes: No results for input(s): CKTOTAL, CKMB, CKMBINDEX, TROPONINI in the last 168 hours. BNP (last 3 results) No results for input(s): PROBNP in the last 8760 hours. HbA1C: No results for input(s): HGBA1C in the last 72 hours. CBG: No results for input(s): GLUCAP in the last 168 hours. Lipid Profile: No results for input(s): CHOL, HDL, LDLCALC, TRIG, CHOLHDL, LDLDIRECT in the last 72 hours. Thyroid Function Tests: No results for input(s): TSH, T4TOTAL, FREET4, T3FREE, THYROIDAB in the last 72 hours. Anemia Panel: No results for input(s): VITAMINB12, FOLATE, FERRITIN, TIBC, IRON, RETICCTPCT in the last 72 hours. Urine analysis:    Component Value Date/Time   COLORURINE YELLOW 12/10/2020 1529   APPEARANCEUR HAZY (A) 11/28/2020 1529   APPEARANCEUR Clear 10/11/2013 1747   LABSPEC 1.010 11/28/2020 1529   LABSPEC 1.008 10/11/2013 1747   PHURINE 6.0 11/21/2020 1529   GLUCOSEU NEGATIVE 12/04/2020 1529   GLUCOSEU Negative 10/11/2013 1747   HGBUR SMALL (A) 11/25/2020 1529   BILIRUBINUR NEGATIVE 12/16/2020 1529   BILIRUBINUR Negative 10/11/2013 1747   KETONESUR NEGATIVE 12/21/2020 1529   PROTEINUR NEGATIVE 12/17/2020 1529   NITRITE NEGATIVE 12/13/2020 1529   LEUKOCYTESUR LARGE (A) 12/14/2020 1529   LEUKOCYTESUR 2+ 10/11/2013 1747    Radiological Exams on Admission: CT Head Wo Contrast  Result Date: 12/20/2020 CLINICAL DATA:  Provided history: Delirium. Additional history provided: Aphasia, uneven pupils. EXAM: CT HEAD WITHOUT CONTRAST TECHNIQUE: Contiguous axial images were obtained from the base of the skull through the  vertex without intravenous contrast. COMPARISON:  Noncontrast head CT 10/17/2020. MRI/MRA head 10/18/2020. CT angiogram head/neck 10/18/2020. FINDINGS: Brain: Mild generalized cerebral and cerebellar atrophy. Redemonstrated large chronic infarct within the left frontal lobe, superior left temporal lobe, left insula and left basal ganglia. An additional moderate-sized acute cortical/subcortical left MCA territory infarct  within the left temporoparietal junction has increased in extent as compared to prior examinations, and appears subacute or chronic. Background mild patchy and ill-defined hypoattenuation within the cerebral white matter, nonspecific but compatible chronic small vessel ischemic disease. Redemonstrated chronic lacunar infarcts within the pons. Redemonstration of multiple chronic infarcts within the bilateral cerebellar hemispheres. There is no acute intracranial hemorrhage. No extra-axial fluid collection. No evidence of an intracranial mass. No midline shift. Vascular: No hyperdense vessel.  Atherosclerotic calcifications. Skull: Normal. Negative for fracture or focal lesion. Sinuses/Orbits: Visualized orbits show no acute finding. Redemonstrated chronic bilateral medially displaced fracture deformities of the lamina papyracea. Redemonstrated chronic nasal bone fracture deformities. No significant paranasal sinus disease at the imaged levels. IMPRESSION: Moderate-sized left MCA territory cortical/subcortical infarct within the left temporoparietal junction, increased in extent as compared to the prior brain MRI of 10/10/2020, but subacute to chronic in appearance. Additional large chronic left MCA territory infarct within the left frontal and temporal lobes as well as left insula and left basal ganglia, not significantly changed in extent as compared to the prior MRI. Background mild generalized parenchymal atrophy and cerebral white matter chronic small vessel ischemic disease. Redemonstrated  chronic lacunar infarcts within the pons. Redemonstration of multiple chronic infarcts within bilateral cerebellar hemispheres. Electronically Signed   By: Kellie Simmering DO   On: 12/15/2020 14:51   CT Angio Chest PE W and/or Wo Contrast  Result Date: 11/21/2020 CLINICAL DATA:  Altered mental status.  Shortness of breath. EXAM: CT ANGIOGRAPHY CHEST CT ABDOMEN AND PELVIS WITH CONTRAST TECHNIQUE: Multidetector CT imaging of the chest was performed using the standard protocol during bolus administration of intravenous contrast. Multiplanar CT image reconstructions and MIPs were obtained to evaluate the vascular anatomy. Multidetector CT imaging of the abdomen and pelvis was performed using the standard protocol during bolus administration of intravenous contrast. CONTRAST:  73mL OMNIPAQUE IOHEXOL 350 MG/ML SOLN COMPARISON:  CT abdomen pelvis dated Sep 25, 2013. CT chest dated November 20, 2010. FINDINGS: CTA CHEST FINDINGS Cardiovascular: Satisfactory opacification of the pulmonary arteries to the segmental level. There is some mixing artifact within the left and right pulmonary arteries, but there is also some chronic appearing wall adherent thrombus in the mid right pulmonary artery (series 7, image 34), distal left pulmonary artery (series 4, image 37; series 7, image 49), and a right lower lobe segmental pulmonary artery (series 4, image 56). Markedly enlarged pulmonary arteries. Marked cardiomegaly. No pericardial effusion. No thoracic aortic aneurysm. Coronary, aortic arch, and branch vessel atherosclerotic vascular disease. Reflux of contrast into the IVC and hepatic veins. Mediastinum/Nodes: No enlarged mediastinal, hilar, or axillary lymph nodes. Thyroid gland, trachea, and esophagus demonstrate no significant findings. Lungs/Pleura: Small right pleural effusion. Subsegmental atelectasis in the right lower lobe. No consolidation or pneumothorax. Musculoskeletal: No chest wall abnormality. No acute or significant  osseous findings. Review of the MIP images confirms the above findings. CT ABDOMEN AND PELVIS FINDINGS Hepatobiliary: No focal liver abnormality. Multiple small gallstones again noted. No gallbladder wall thickening or biliary dilatation. Pancreas: Unremarkable. No pancreatic ductal dilatation or surrounding inflammatory changes. Spleen: Normal in size without focal abnormality. Adrenals/Urinary Tract: Adrenal glands are unremarkable. Mild bilateral renal atrophy. No renal lesion, calculi, or hydronephrosis. The bladder is unremarkable. Stomach/Bowel: Stomach is within normal limits. Appendix appears normal. No evidence of bowel wall thickening, distention, or inflammatory changes. Left-sided colonic diverticulosis. Large stool ball in the rectum. Vascular/Lymphatic: Aortic atherosclerosis. No enlarged abdominal or pelvic lymph nodes. Reproductive: Multiple uterine fibroids, some calcified.  No adnexal mass. Other: No free fluid or pneumoperitoneum. Moderate fat containing paraumbilical and infraumbilical ventral abdominal hernias. Musculoskeletal: No acute or significant osseous findings. Review of the MIP images confirms the above findings. IMPRESSION: CHEST: 1. No evidence of acute pulmonary embolism. Small volume chronic appearing wall adherent thrombus in the mid right pulmonary artery, distal left pulmonary artery, and a right lower lobe segmental pulmonary artery. 2. Markedly enlarged pulmonary arteries with reflux of contrast into the IVC and hepatic veins, consistent with right heart dysfunction. 3. Marked cardiomegaly. 4. Small right pleural effusion. 5.  Aortic atherosclerosis (ICD10-I70.0). ABDOMEN AND PELVIS: 1. No acute intra-abdominal process. 2. Cholelithiasis. 3. Large stool ball in the rectum. Correlate for fecal impaction. 4. Fibroid uterus. Electronically Signed   By: Titus Dubin M.D.   On: 12/01/2020 19:08   CT ABDOMEN PELVIS W CONTRAST  Result Date: 12/20/2020 CLINICAL DATA:  Altered  mental status.  Shortness of breath. EXAM: CT ANGIOGRAPHY CHEST CT ABDOMEN AND PELVIS WITH CONTRAST TECHNIQUE: Multidetector CT imaging of the chest was performed using the standard protocol during bolus administration of intravenous contrast. Multiplanar CT image reconstructions and MIPs were obtained to evaluate the vascular anatomy. Multidetector CT imaging of the abdomen and pelvis was performed using the standard protocol during bolus administration of intravenous contrast. CONTRAST:  59mL OMNIPAQUE IOHEXOL 350 MG/ML SOLN COMPARISON:  CT abdomen pelvis dated Sep 25, 2013. CT chest dated November 20, 2010. FINDINGS: CTA CHEST FINDINGS Cardiovascular: Satisfactory opacification of the pulmonary arteries to the segmental level. There is some mixing artifact within the left and right pulmonary arteries, but there is also some chronic appearing wall adherent thrombus in the mid right pulmonary artery (series 7, image 34), distal left pulmonary artery (series 4, image 37; series 7, image 49), and a right lower lobe segmental pulmonary artery (series 4, image 56). Markedly enlarged pulmonary arteries. Marked cardiomegaly. No pericardial effusion. No thoracic aortic aneurysm. Coronary, aortic arch, and branch vessel atherosclerotic vascular disease. Reflux of contrast into the IVC and hepatic veins. Mediastinum/Nodes: No enlarged mediastinal, hilar, or axillary lymph nodes. Thyroid gland, trachea, and esophagus demonstrate no significant findings. Lungs/Pleura: Small right pleural effusion. Subsegmental atelectasis in the right lower lobe. No consolidation or pneumothorax. Musculoskeletal: No chest wall abnormality. No acute or significant osseous findings. Review of the MIP images confirms the above findings. CT ABDOMEN AND PELVIS FINDINGS Hepatobiliary: No focal liver abnormality. Multiple small gallstones again noted. No gallbladder wall thickening or biliary dilatation. Pancreas: Unremarkable. No pancreatic ductal  dilatation or surrounding inflammatory changes. Spleen: Normal in size without focal abnormality. Adrenals/Urinary Tract: Adrenal glands are unremarkable. Mild bilateral renal atrophy. No renal lesion, calculi, or hydronephrosis. The bladder is unremarkable. Stomach/Bowel: Stomach is within normal limits. Appendix appears normal. No evidence of bowel wall thickening, distention, or inflammatory changes. Left-sided colonic diverticulosis. Large stool ball in the rectum. Vascular/Lymphatic: Aortic atherosclerosis. No enlarged abdominal or pelvic lymph nodes. Reproductive: Multiple uterine fibroids, some calcified. No adnexal mass. Other: No free fluid or pneumoperitoneum. Moderate fat containing paraumbilical and infraumbilical ventral abdominal hernias. Musculoskeletal: No acute or significant osseous findings. Review of the MIP images confirms the above findings. IMPRESSION: CHEST: 1. No evidence of acute pulmonary embolism. Small volume chronic appearing wall adherent thrombus in the mid right pulmonary artery, distal left pulmonary artery, and a right lower lobe segmental pulmonary artery. 2. Markedly enlarged pulmonary arteries with reflux of contrast into the IVC and hepatic veins, consistent with right heart dysfunction. 3. Marked cardiomegaly. 4. Small right  pleural effusion. 5.  Aortic atherosclerosis (ICD10-I70.0). ABDOMEN AND PELVIS: 1. No acute intra-abdominal process. 2. Cholelithiasis. 3. Large stool ball in the rectum. Correlate for fecal impaction. 4. Fibroid uterus. Electronically Signed   By: Titus Dubin M.D.   On: 12/20/2020 19:08   DG Chest Port 1 View  Result Date: 11/24/2020 CLINICAL DATA:  Shortness of breath. EXAM: PORTABLE CHEST 1 VIEW COMPARISON:  Single-view of the chest 10/29/2020. PA and lateral chest 03/02/2013. FINDINGS: There is marked enlargement of the cardiopericardial silhouette consistent with cardiomegaly and possible pericardial effusion. Lungs are clear. Aortic  atherosclerosis. No pneumothorax or pleural effusion. IMPRESSION: Marked enlargement of the cardiopericardial silhouette consistent with cardiomegaly and possibly a pericardial effusion. Lungs clear. Aortic Atherosclerosis (ICD10-I70.0). Electronically Signed   By: Inge Rise M.D.   On: 12/16/2020 14:30      Assessment/Plan  Atrial fibrillation with RVR/hypotension and probable low output heart failure -EF 20% on echo in 09/2020 -Cardiology has evaluated and initiated goals of care discussion with family. Sister expressed she did not want aggressive treatment. No inotrope or pressors recommended at this time  -Continue IV amiodarone infusion, digoxin 0.125 mg daily and IV Lasix 80mg  BID pre cardiology - Hold beta blocker, ACE  - switch to IV heparin for anticoagulation since she is too lethargic for PO - I have discussed her extremely poor prognosis with her sister [name]. She understand that patient might not survive this hospitalization. She would like [place] to continue care as we are doing but confirms pt DNR status. She is interested in working with hospice to transition out to hospice house. I have offered her to stay with patient tonight if she is interested.  -Palliative care consulted and will see in the morning.   Acute metabolic encephalopathy secondary to hypotension/questionable sepsis from UTI -continue cardiac treatment as above -continue IV Rocephin pending urine culture. Procalcitonin pending  AKI on CKD 3a -creatinine 1.48 from baseline of around 1 -continue to monitor while receiving diuresis  Hx of CVA with residual expressive aphasia -pt previously able to answer yes or no questions but today has stopped talking -continue IV heparin in place of Eliquis  Hx of thrombocytopenia -Plt at low normal on admission. Monitor  Possible fecal impaction -Large stool ball in rectum noted on CT abd. Will hold on enema for now to avoid any causing any electrolyte imbalance  and avoid movement of patient while she is hemodynamically unstable  DVT prophylaxis:IV heparin  Code Status: Full Family Communication: Plan discussed with sister [name] over the phone disposition Plan: Hospice with observation Consults called:  Admission status: Observation   Level of care: Progressive      Orene Desanctis DO [facility]   If 7PM-7AM, please contact night-coverage [email or web address]   11/30/2020, 9:05 PM

## 2020-12-05 NOTE — ED Notes (Signed)
Notified a Therapist, sports of Pts BP

## 2020-12-05 NOTE — ED Notes (Signed)
Pt placed on 2L O2 via [place].

## 2020-12-05 NOTE — ED Notes (Signed)
O2 via [place] @ 3 lpm d/c with doctor's request, pt maintained SpO2 upper 90's/100% RA

## 2020-12-05 NOTE — ED Triage Notes (Signed)
Pt bib [facility] from [facility] with complaints of increased ams and shob. Pt only answers yes and no at baseline but facility states she has been more altered since early this am. EMS reports pt is in afib RVR 130-170s, 90/50 BP, 30 R.

## 2020-12-05 NOTE — ED Notes (Signed)
Re-paged [facility]

## 2020-12-06 DIAGNOSIS — I428 Other cardiomyopathies: Secondary | ICD-10-CM | POA: Diagnosis not present

## 2020-12-06 DIAGNOSIS — Z515 Encounter for palliative care: Secondary | ICD-10-CM | POA: Diagnosis not present

## 2020-12-06 DIAGNOSIS — I4891 Unspecified atrial fibrillation: Secondary | ICD-10-CM | POA: Diagnosis not present

## 2020-12-06 DIAGNOSIS — I130 Hypertensive heart and chronic kidney disease with heart failure and stage 1 through stage 4 chronic kidney disease, or unspecified chronic kidney disease: Secondary | ICD-10-CM | POA: Diagnosis not present

## 2020-12-06 DIAGNOSIS — Z823 Family history of stroke: Secondary | ICD-10-CM | POA: Diagnosis not present

## 2020-12-06 DIAGNOSIS — M1990 Unspecified osteoarthritis, unspecified site: Secondary | ICD-10-CM | POA: Diagnosis not present

## 2020-12-06 DIAGNOSIS — D696 Thrombocytopenia, unspecified: Secondary | ICD-10-CM | POA: Diagnosis not present

## 2020-12-06 DIAGNOSIS — N1831 Chronic kidney disease, stage 3a: Secondary | ICD-10-CM | POA: Diagnosis not present

## 2020-12-06 DIAGNOSIS — G9341 Metabolic encephalopathy: Secondary | ICD-10-CM | POA: Diagnosis not present

## 2020-12-06 DIAGNOSIS — I2782 Chronic pulmonary embolism: Secondary | ICD-10-CM | POA: Diagnosis not present

## 2020-12-06 DIAGNOSIS — J9621 Acute and chronic respiratory failure with hypoxia: Secondary | ICD-10-CM | POA: Diagnosis not present

## 2020-12-06 DIAGNOSIS — Z87891 Personal history of nicotine dependence: Secondary | ICD-10-CM | POA: Diagnosis not present

## 2020-12-06 DIAGNOSIS — N390 Urinary tract infection, site not specified: Secondary | ICD-10-CM | POA: Diagnosis not present

## 2020-12-06 DIAGNOSIS — I69351 Hemiplegia and hemiparesis following cerebral infarction affecting right dominant side: Secondary | ICD-10-CM | POA: Diagnosis not present

## 2020-12-06 DIAGNOSIS — I5021 Acute systolic (congestive) heart failure: Secondary | ICD-10-CM | POA: Diagnosis not present

## 2020-12-06 DIAGNOSIS — N179 Acute kidney failure, unspecified: Secondary | ICD-10-CM | POA: Diagnosis not present

## 2020-12-06 DIAGNOSIS — Z66 Do not resuscitate: Secondary | ICD-10-CM | POA: Diagnosis not present

## 2020-12-06 DIAGNOSIS — Z20822 Contact with and (suspected) exposure to covid-19: Secondary | ICD-10-CM | POA: Diagnosis not present

## 2020-12-06 DIAGNOSIS — I69320 Aphasia following cerebral infarction: Secondary | ICD-10-CM | POA: Diagnosis not present

## 2020-12-06 DIAGNOSIS — I420 Dilated cardiomyopathy: Secondary | ICD-10-CM | POA: Diagnosis not present

## 2020-12-06 DIAGNOSIS — I509 Heart failure, unspecified: Secondary | ICD-10-CM

## 2020-12-06 DIAGNOSIS — I5023 Acute on chronic systolic (congestive) heart failure: Secondary | ICD-10-CM

## 2020-12-06 DIAGNOSIS — Z8249 Family history of ischemic heart disease and other diseases of the circulatory system: Secondary | ICD-10-CM | POA: Diagnosis not present

## 2020-12-06 DIAGNOSIS — I959 Hypotension, unspecified: Secondary | ICD-10-CM | POA: Diagnosis not present

## 2020-12-06 DIAGNOSIS — E875 Hyperkalemia: Secondary | ICD-10-CM | POA: Diagnosis not present

## 2020-12-06 DIAGNOSIS — Z8673 Personal history of transient ischemic attack (TIA), and cerebral infarction without residual deficits: Secondary | ICD-10-CM | POA: Diagnosis not present

## 2020-12-06 DIAGNOSIS — I480 Paroxysmal atrial fibrillation: Secondary | ICD-10-CM | POA: Diagnosis not present

## 2020-12-06 DIAGNOSIS — Z7901 Long term (current) use of anticoagulants: Secondary | ICD-10-CM | POA: Diagnosis not present

## 2020-12-06 DIAGNOSIS — J449 Chronic obstructive pulmonary disease, unspecified: Secondary | ICD-10-CM | POA: Diagnosis not present

## 2020-12-06 LAB — BLOOD CULTURE ID PANEL (REFLEXED) - BCID2

## 2020-12-06 LAB — BASIC METABOLIC PANEL
Anion gap: 15 (ref 5–15)
BUN: 55 mg/dL — ABNORMAL HIGH (ref 8–23)
CO2: 16 mmol/L — ABNORMAL LOW (ref 22–32)
Calcium: 8.7 mg/dL — ABNORMAL LOW (ref 8.9–10.3)
Chloride: 103 mmol/L (ref 98–111)
Creatinine, Ser: 1.72 mg/dL — ABNORMAL HIGH (ref 0.44–1.00)
GFR, Estimated: 32 mL/min — ABNORMAL LOW (ref >60)
Glucose, Bld: 82 mg/dL (ref 70–99)
Potassium: 6.1 mmol/L — ABNORMAL HIGH (ref 3.5–5.1)
Sodium: 134 mmol/L — ABNORMAL LOW (ref 135–145)

## 2020-12-06 LAB — APTT: aPTT: >200 seconds (ref 24–36)

## 2020-12-06 LAB — HEPARIN LEVEL (UNFRACTIONATED): Heparin Unfractionated: >1.1 IU/mL — ABNORMAL HIGH (ref 0.30–0.70)

## 2020-12-06 MED ORDER — POLYVINYL ALCOHOL 1.4 % OP SOLN
1.0000 [drp] | Freq: Four times a day (QID) | OPHTHALMIC | Status: DC | PRN
  Filled 2020-12-06: qty 15

## 2020-12-06 MED ORDER — LORAZEPAM 2 MG/ML IJ SOLN: 1.0000 mg | INTRAMUSCULAR | Status: DC | PRN

## 2020-12-06 MED ORDER — ONDANSETRON 4 MG PO TBDP
4.0000 mg | ORAL_TABLET | Freq: Four times a day (QID) | ORAL | Status: DC | PRN
  Filled 2020-12-06: qty 1

## 2020-12-06 MED ORDER — LORAZEPAM 1 MG PO TABS: 1.0000 mg | ORAL_TABLET | ORAL | Status: DC | PRN

## 2020-12-06 MED ORDER — BIOTENE DRY MOUTH MT LIQD: 15.0000 mL | OROMUCOSAL | Status: DC | PRN

## 2020-12-06 MED ORDER — HALOPERIDOL 0.5 MG PO TABS
0.5000 mg | ORAL_TABLET | ORAL | Status: DC | PRN
  Filled 2020-12-06: qty 1

## 2020-12-06 MED ORDER — MORPHINE 100MG IN NS 100ML (1MG/ML) PREMIX INFUSION
2.0000 mg/h | INTRAVENOUS | Status: DC
  Administered 2020-12-06: 2 mg/h via INTRAVENOUS
  Administered 2020-12-06: 4 mg/h via INTRAVENOUS
  Administered 2020-12-06: 3 mg/h via INTRAVENOUS
  Administered 2020-12-06: 5 mg/h via INTRAVENOUS
  Filled 2020-12-06: qty 100

## 2020-12-06 MED ORDER — ACETAMINOPHEN 325 MG PO TABS: 650.0000 mg | ORAL_TABLET | Freq: Four times a day (QID) | ORAL | Status: DC | PRN

## 2020-12-06 MED ORDER — MORPHINE SULFATE (CONCENTRATE) 10 MG/0.5ML PO SOLN: 5.0000 mg | ORAL | Status: DC | PRN

## 2020-12-06 MED ORDER — GLYCOPYRROLATE 0.2 MG/ML IJ SOLN
0.2000 mg | INTRAMUSCULAR | Status: DC | PRN
Start: 2020-12-06 — End: 2020-12-07

## 2020-12-06 MED ORDER — ACETAMINOPHEN 650 MG RE SUPP: 650.0000 mg | Freq: Four times a day (QID) | RECTAL | Status: DC | PRN

## 2020-12-06 MED ORDER — LORAZEPAM 2 MG/ML PO CONC: 1.0000 mg | ORAL | Status: DC | PRN

## 2020-12-06 MED ORDER — MORPHINE BOLUS VIA INFUSION
2.0000 mg | INTRAVENOUS | Status: DC | PRN
Start: 2020-12-06 — End: 2020-12-07
  Filled 2020-12-06: qty 2

## 2020-12-06 MED ORDER — ONDANSETRON HCL 4 MG/2ML IJ SOLN
4.0000 mg | Freq: Four times a day (QID) | INTRAMUSCULAR | Status: DC | PRN
  Administered 2020-12-06: 4 mg via INTRAVENOUS
  Filled 2020-12-06: qty 2

## 2020-12-06 MED ORDER — HALOPERIDOL LACTATE 5 MG/ML IJ SOLN: 0.5000 mg | INTRAMUSCULAR | Status: DC | PRN

## 2020-12-06 MED ORDER — MORPHINE SULFATE (PF) 2 MG/ML IV SOLN
1.0000 mg | INTRAVENOUS | Status: DC | PRN
  Administered 2020-12-06: 1 mg via INTRAVENOUS
  Filled 2020-12-06: qty 1

## 2020-12-06 MED ORDER — GLYCOPYRROLATE 0.2 MG/ML IJ SOLN: 0.2000 mg | INTRAMUSCULAR | Status: DC | PRN

## 2020-12-06 MED ORDER — LACTATED RINGERS IV BOLUS
500.0000 mL | Freq: Once | INTRAVENOUS | Status: AC
  Administered 2020-12-06: 500 mL via INTRAVENOUS

## 2020-12-06 MED ORDER — GLYCOPYRROLATE 1 MG PO TABS
1.0000 mg | ORAL_TABLET | ORAL | Status: DC | PRN
  Filled 2020-12-06: qty 1

## 2020-12-06 MED ORDER — HALOPERIDOL LACTATE 2 MG/ML PO CONC
0.5000 mg | ORAL | Status: DC | PRN
  Filled 2020-12-06: qty 0.3

## 2020-12-06 NOTE — Progress Notes (Addendum)
Pt time of death 22:50. MD notified. Next of kin notified.

## 2020-12-06 NOTE — Consult Note (Addendum)
Consultation Note Date: 11/22/2020   Patient Name: Angelica Strickland  DOB: Jul 10, 1955  MRN: [id number]  Age / Sex: 66 y.o., female  PCP: Angelica Sons, MD Referring Physician: Elodia Florence., *  Reason for Consultation: Establishing goals of care "patient in multi-system failure"  HPI/Patient Profile: 65 y.o. female  with past medical history of recent CVA (07/863), chronic systolic heart failure (EF 20%), paroxysmal atrial fibrillation on Eliquis, COPD, hypertension, and thrombocytopenia who presented to [facility] emergency department from SNF on 11/22/2020 with concern for altered mental status. Patient was sent to SNF in June after hospitalization in June for acute CVA  In the ED, patient was hypotensive 90/50s, in atrial fibrillation with RVR 130-170, and tachypnea. BNP 1947. Creatinine elevated at 1.48 from prior of 0.98. 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  Per cardiology, patient is likely in low output heart failure. PMT has been asked to assist with end of life care.   Clinical Assessment and Goals of Care: I have reviewed medical records including EPIC notes, labs and imaging, examined the patient, and discussed with bedside RN. Patient opens her eyes to voice, but does not verbalize on my assessment. Mild facial grimacing noted. RN reports she was given morphine earlier this morning (09:51).   I spoke with sister Angelica Strickland by phone to discuss diagnosis, prognosis, GOC, EOL wishes, disposition, and options. Angelica Strickland shares that she is Angelica Strickland's only sibling that lives in [place] and is designated to make Angelica Strickland's medical decisions (this was a verbal agreement from several years ago).   I introduced Palliative Medicine as specialized medical care for people living with serious illness. It focuses on providing relief from the symptoms and stress of a serious illness.   We discussed a brief life review  of the patient. Angelica Strickland describes her sister as "funny, loved to dance, and an excellent cook". She was also a very loving and accepting person. Angelica Strickland shares that she and Angelica Strickland were very close, they called each other every day. Prior to the stroke on 5/27, Angelica Strickland was living independently in her home. Her significant other of 40 years had passed away in 24-Aug-2020. Angelica Strickland did not have children of her own, but was very close with his children.    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 At baseline, she could answer yes and no questions. Angelica Strickland would visit Angelica Strickland almost every day while she was in SNF.    We discussed her current illness and what it means in the larger context of her ongoing co-morbidities.  Natural disease trajectory at EOL was discussed. Angelica Strickland understands that her sister is in the process of dying. She shares that Angelica Strickland is a very spiritual person, always went to church and read her Bible although did not commit to a particular denomination. She requests a visit from spiritual care to offer prayer over her sister.    The difference between full scope medical intervention and comfort care was discussed.  I reviewed the concept of comfort care, emphasizing full scope medical interventions will be stopped, allowing a natural course to occur. Discussed that the goal is comfort and dignity rather than cure/prolonging life.  Discussed comfort care provided in the hospital, and what that would look like--keeping her clean and dry, no labs, no artificial hydration or feeding, no antibiotics, minimizing of medications, and medication for pain and dyspnea.   Briefly discussed the option of transfer to residential hospice. Angelica Strickland states that per Dr. Florene Glen, EOL may be imminent (within hours) and his recommendation was to continue comfort care in-house.   Questions and concerns were addressed. I provided Angelica Strickland with PMT contact info and  encouraged her to call with questions or concerns.    Primary decision maker: Angelica Strickland (sister)    SUMMARY OF RECOMMENDATIONS   Continue full comfort measures  DNR/DNI as previously documented Start morphine infusion PRN orders are available for symptom management at EOL  Continue unrestricted visitation  PMT will continue to follow   Symptom Management:  Lorazepam (ATIVAN) prn for anxiety Haloperidol (HALDOL) prn for agitation  Glycopyrrolate (ROBINUL) for excessive secretions Ondansetron (ZOFRAN) prn for nausea Polyvinyl alcohol (LIQUIFILM TEARS) prn for dry eyes Antiseptic oral rinse (BIOTENE) prn for dry mouth  Code Status/Advance Care Planning: DNR   Palliative Prophylaxis:  Oral Care and Turn Reposition  Additional Recommendations (Limitations, Scope, Preferences): Full Comfort Care  Psycho-social/Spiritual:  Created space and opportunity for patient and family to express thoughts and feelings regarding patient's current medical situation.  Emotional support provided   Prognosis:  Hours - Days  Discharge Planning: Anticipated Hospital Death      Primary Diagnoses: Present on Admission: **None**   I have reviewed the medical record, interviewed the patient and family, and examined the patient. The following aspects are pertinent.  Past Medical History:  Diagnosis Date   Atrial fibrillation ([place]) 11/11/2014   Carpal tunnel syndrome 11/11/2014   Congestive heart failure ([place]) 11/11/2014   COPD (chronic obstructive pulmonary disease) ([place]) 11/11/2014   Depression 11/11/2014   Essential hypertension 11/11/2014   History of pituitary tumor 11/11/2014   Hypopotassemia 11/11/2014   Osteoarthritis 11/11/2014   Thrombocytopenia ([place]) 11/11/2014    Family History  Problem Relation Age of Onset   Hypertension Mother    Stroke Mother    Alzheimer's disease Mother    CAD Father    Hypertension Father    Healthy Sister    Breast cancer Sister    Healthy  Sister    Scheduled Meds:  furosemide  80 mg Intravenous BID   Continuous Infusions:  amiodarone 30 mg/hr (12/20/2020 0831)   cefTRIAXone (ROCEPHIN)  IV Stopped (11/25/2020 0056)   PRN Meds:.acetaminophen **OR** acetaminophen, antiseptic oral rinse, glycopyrrolate **OR** glycopyrrolate **OR** glycopyrrolate, haloperidol **OR** haloperidol **OR** haloperidol lactate, LORazepam **OR** LORazepam **OR** LORazepam, morphine injection, morphine CONCENTRATE **OR** morphine CONCENTRATE, ondansetron **OR** ondansetron (ZOFRAN) IV, polyvinyl alcohol Medications Prior to Admission:  Prior to Admission medications   Medication Sig Start Date End Date Taking? Authorizing Provider  acetaminophen (TYLENOL) 500 MG tablet Take 1,000 mg by mouth in the morning.   Yes [provider]  albuterol (PROVENTIL HFA;VENTOLIN HFA) 108 (90 Base) MCG/ACT inhaler Inhale 1 puff into the lungs every 6 (six) hours as needed for wheezing or shortness of breath. 02/27/16  Yes Carles Collet M, PA-C  albuterol (PROVENTIL) (2.5 MG/3ML) 0.083% nebulizer solution Take 2.5 mg by nebulization 2 (two) times daily.   Yes [provider]  apixaban Arne Cleveland) 5  MG TABS tablet Take 1 tablet (5 mg total) by mouth 2 (two) times daily. 10/31/20 10/31/21 Yes Pokhrel, Laxman, MD  atorvastatin (LIPITOR) 40 MG tablet Take 1 tablet (40 mg total) by mouth daily. Patient taking differently: Take 40 mg by mouth at bedtime. 11/01/20  Yes Pokhrel, Corrie Mckusick, MD  B Complex CAPS Take 1 capsule by mouth daily with breakfast.   Yes [provider]  carvedilol (COREG) 12.5 MG tablet Take 1 tablet (12.5 mg total) by mouth 2 (two) times daily with a meal. Hold for systolic blood pressure less than 110 and HR<60. Patient taking differently: Take 12.5 mg by mouth See admin instructions. Take 12.5 mg by mouth two times a day with food and hold for a Systolic reading of <277 and a heart rate of <60 10/31/20  Yes Pokhrel, Laxman, MD  gabapentin  (NEURONTIN) 100 MG capsule Take 100 mg by mouth at bedtime.   Yes [provider]  lisinopril (ZESTRIL) 2.5 MG tablet Take 1 tablet (2.5 mg total) by mouth daily. HOLD FOR SBP<110 Patient taking differently: Take 2.5 mg by mouth See admin instructions. Take 2.5 mg by mouth once a day and hold for a Systolic reading of <824 2/35/36  Yes Pokhrel, Laxman, MD  methylphenidate (RITALIN) 5 MG tablet Take 1 tablet (5 mg total) by mouth 2 (two) times daily with breakfast and lunch. Patient taking differently: Take 5 mg by mouth See admin instructions. Take 5 mg by mouth with breakfast and lunch and hold if sedated 11/01/20  Yes Pokhrel, Laxman, MD  Nutritional Supplements (RESOURCE PO) Take 120 mLs by mouth 3 (three) times daily.   Yes [provider]  OXYGEN Inhale 2 L/min into the lungs as needed (every shift- for SATS <91%).   Yes [provider]  spironolactone (ALDACTONE) 25 MG tablet Take 1 tablet (25 mg total) by mouth daily. Hold for Systolic blood pressure less than 110 Patient taking differently: Take 25 mg by mouth See admin instructions. Take 25 mg by mouth once a day and hold for a Systolic reading of <144 08/06/38  Yes Pokhrel, Laxman, MD  torsemide (DEMADEX) 20 MG tablet Take 2 tablets (40 mg total) by mouth daily. Patient taking differently: Take 20 mg by mouth in the morning. 10/31/20  Yes Pokhrel, Laxman, MD   Allergies  Allergen Reactions   Coumadin  [Warfarin] Other (See Comments)    Resulted in bleeding into her brain- "Allergic," per MAR   Clonidine Derivatives Rash and Other (See Comments)    "Allergic," per MAR   Review of Systems  Unable to perform ROS: Patient nonverbal   Physical Exam Vitals reviewed.  Constitutional:      General: She is not in acute distress.    Appearance: She is ill-appearing.     Comments: Somnolent  Cardiovascular:     Rate and Rhythm: Normal rate.  Pulmonary:     Effort: Pulmonary effort is normal.    Vital Signs: BP  (!) 85/69 (BP Location: Right Arm)   Pulse (!) 115   Temp (!) 97.5 F (36.4 C) (Rectal)   Resp (!) 34   Ht _0  (1.702 m)   Wt 77.6 kg   SpO2 95%   BMI 26.79 kg/m  Pain Scale: Faces   Pain Score: Asleep   SpO2: SpO2: 95 % O2 Device:SpO2: 95 % O2 Flow Rate: .O2 Flow Rate (L/min): 2 L/min  IO: Intake/output summary:  Intake/Output Summary (Last 24 hours) at 12/03/2020 1104 Last data filed  at 11/30/2020 0409 Gross per 24 hour  Intake 1565.89 ml  Output 250 ml  Net 1315.89 ml    LBM: Last BM Date: 11/29/2020 Baseline Weight: Weight: 98.1 kg Most recent weight: Weight: 77.6 kg      Palliative Assessment/Data: PPS 10%     Time In: 1120 Time Out: 1230 Time Total: 70 minutes Greater than 50%  of this time was spent counseling and coordinating care related to the above assessment and plan.  Signed by: Lavena Bullion, NP   Please contact Palliative Medicine Team phone at [phone] for questions and concerns.  For individual provider: See Shea Evans

## 2020-12-06 NOTE — Progress Notes (Addendum)
PROGRESS NOTE    Angelica Strickland  GEX:[id number] DOB: 1955/06/11 DOA: 11/27/2020 PCP: Birdie Sons, MD  Chief Complaint  Patient presents with   Altered Mental Status    Brief Narrative:   Angelica Strickland is Angelica Strickland 65 y.o. female with medical history significant for hx of recent CVA (0/1027), systolic heart failure (EF 20%, paroxysmal atrial fibrillation on Eliquis, hypertension, thrombocytopenia, COPD who presents from skilled nursing facility for concerns of altered mental status.   Patient was sent to SNF following an admission in June for CVA with expressive aphasia. Patient can only moan and grimace at time of my evaluation.  Reportedly per ED documentation, patient at baseline can answer yes or no questions. However, today she stopped answering questions and was noted to be less responsive with hypotension and tachycardia.   She was diagnosed with low output heart failure and afib with RVR.  Cardiology was consulted.  She was started on lasix, amiodarone, heparin and discussions regarding hospice were started.     She continued to be hypotensive and tachycardic on 7/16.  Cardiology noted appropriateness for comfort measures.  Discussed on 7/16 with sister, [name] over phone.  She says she wants her sister to be comfortable and agrees with plan for comfort measures.     Assessment & Plan:   Principal Problem:   Hypotension Active Problems:   Acute CHF (congestive heart failure) (HCC)   Atrial fibrillation with RVR (HCC)   Acute-on-chronic kidney injury ([place])   History of CVA (cerebrovascular accident)   Acute metabolic encephalopathy   Acute lower UTI   DNR (do not resuscitate)  Goals of care: Discussed with sister, [name].  She notes very close relationship with her sister, they called themselves twins.  She notes her and her sister had talked previously about situations like this and had discussed being made comfortable.  She notes Easton Fetty few days or so ago her sister  said she was "ready to go home".  Angelica Strickland notes she wants her sister to be comfortable.  We discussed transition to comfort measures.  Will discontinue medications not focused on comfort.  I expect Mrs. Daddario will likely have [facility] Killman hospital death, discussed this with Angelica Strickland and also discussed the uncertainty of how much time she has left at this time.  If things change, discussed possibility of inpatient hospice.  Angelica Strickland is going to reach out to family and let those who want to visit, do so (expressed importance of urgency given her illness).   Comfort measures  Acute on Chronic Systolic Heart Failure  Low Output Heart Failure  Hypotension CT with markedly enlarged pulm arteries with reflux of contrast into IVC and hepatic veins, c/w right heart dysfunction, small R pleural effusion CXR with marked enlargement of cardiopericardial silhouette c/w cardiomegaly and possibly pericardial effusion Echo 5/22 with EF 20-25%, global hypokinesis Extremities are cold Cardiology c/s, appreciate recommendations - note she's appropriate for comfort, did not recommend pressors/inotropes given overall poor state of health Hold digoxin, beta blocker, ace  Acute Metabolic Encephalopathy  Hx CVA with Residual Expressive Aphasia Unclear baseline, aphasia from prior stroke contributing - ? Worsening stroke - subacute, infection, etc Head CT with moderate sized L MCA territory cortical/subcortical infarct within L temporoparietal junction, increased in extent as compared to prior brain MRI of 10/10/20 - subacute to chronic in appearance.  Additional large chronic L MCA territory infarct within L frontal and temporal lobes as well as L insula and L basal  ganglia, no significantly changed in extent as compared to prior MRI.  Remdemonstration of multiple chronic infarcts within bilateral cerebellar hemispheres.  Redemonstrated chronic lacunar infarcts within the pons.  (See report)  Acute Kidney Injury   Hyperkalemia Baseline 1, presented with creatinine 1.48, worsening today with hyperkalemia Related to HF, contrast Comfort measures as noted above  Pyuria UA with >50 WBC's, large LE, rare bacteria Follow cultures.  Procal not that impressive. 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   Difficult to place patient No       Consultants:  cardiology  Procedures:  none  Antimicrobials:  Anti-infectives (From admission, onward)    Start     Dose/Rate Route Frequency Ordered Stop   12/19/2020 0000  cefTRIAXone (ROCEPHIN) 1 g in sodium chloride 0.9 % 100 mL IVPB        1 g 200 mL/hr over 30 Minutes Intravenous Every 24 hours 11/30/2020 2041     11/21/2020 1630  cefTRIAXone (ROCEPHIN) 1 g in sodium chloride 0.9 % 100 mL IVPB        1 g 200 mL/hr over 30 Minutes Intravenous  Once 11/21/2020 1622 12/04/2020 1719          Subjective: Nonverbal, moans at times  Objective: Vitals:   12/09/2020 0100 12/14/2020 0300 12/10/2020 0400 12/17/2020 0900  BP: (!) 84/74 (!) 77/56 (!) 82/67 (!) 85/69  Pulse: (!) 115     Resp: (!) 34     Temp:      TempSrc:      SpO2: 95%     Weight:      Height:        Intake/Output Summary (Last 24 hours) at 12/16/2020 0937 Last data filed at  12/09/2020 0409 Gross per 24 hour  Intake 1565.89 ml  Output 250 ml  Net 1315.89 ml   Filed Weights   12/16/2020 1358 11/28/2020 0004  Weight: 98.1 kg 77.6 kg    Examination:  General exam: Appears calm and comfortable - occasional moaning, but does not appear in discomfort Respiratory system: Clear to auscultation. Respiratory effort normal. Cardiovascular system: RRR Gastrointestinal system: Abdomen is nondistended, soft and nontender. Central nervous system: nonverbal, attends when I speak to her, but no meaningful speech Extremities:  cold extremities  Data Reviewed: I have personally reviewed following labs and imaging studies  CBC: Recent Labs  Lab 11/30/2020 1336  WBC 6.1  NEUTROABS 3.0  HGB 11.0*  HCT 35.3*  MCV 103.2*  PLT 989    Basic Metabolic Panel: Recent Labs  Lab 12/18/2020 1528 12/12/2020 0630  NA 136 134*  K 4.2 6.1*  CL 103 103  CO2 24 16*  GLUCOSE 96 82  BUN 51*  55*  CREATININE 1.48* 1.72*  CALCIUM 8.7* 8.7*    GFR: Estimated Creatinine Clearance: 34.1 mL/min (Montey Ebel) (by C-G formula based on SCr of 1.72 mg/dL (H)).  Liver Function Tests: Recent Labs  Lab 11/28/2020 1528  AST 28  ALT 22  ALKPHOS 79  BILITOT 1.6*  PROT 6.5  ALBUMIN 3.2*    CBG: No results for input(s): GLUCAP in the last 168 hours.   Recent Results (from the past 240 hour(s))  Resp Panel by RT-PCR (Flu Lonni Dirden&B, Covid) Nasopharyngeal Swab     Status: None   Collection Time: 12/13/2020  4:27 PM   Specimen: Nasopharyngeal Swab; Nasopharyngeal(NP) swabs in vial transport medium  Result Value Ref Range Status   SARS Coronavirus 2 by RT PCR NEGATIVE NEGATIVE Final    Comment: (NOTE) SARS-CoV-2 target nucleic acids are NOT DETECTED.  The SARS-CoV-2 RNA is generally detectable in upper respiratory specimens during the acute phase of infection. The lowest concentration of SARS-CoV-2 viral copies this assay can detect is 138 copies/mL. Eugene Isadore negative result does not preclude  SARS-Cov-2 infection and should not be used as the sole basis for treatment or other patient management decisions. Jull Harral negative result may occur with  improper specimen collection/handling, submission of specimen other than nasopharyngeal swab, presence of viral mutation(s) within the areas targeted by this assay, and inadequate number of viral copies(<138 copies/mL). Mairyn Lenahan negative result must be combined with clinical observations, patient history, and epidemiological information. The expected result is Negative.  Fact Sheet for Patients:  EntrepreneurPulse.com.au  Fact Sheet for Healthcare Providers:  IncredibleEmployment.be  This test is no t yet approved or cleared by the [place] FDA and  has been authorized for detection and/or diagnosis of SARS-CoV-2 by FDA under an Emergency Use Authorization (EUA). This EUA will remain  in effect (meaning this test can be used) for the duration of the COVID-19 declaration under Section 564(b)(1) of the Act, 21 U.S.C.section 360bbb-3(b)(1), unless the authorization is terminated  or revoked sooner.       Influenza Dwaine Pringle by PCR NEGATIVE NEGATIVE Final   Influenza B by PCR NEGATIVE NEGATIVE Final    Comment: (NOTE) The Xpert Xpress SARS-CoV-2/FLU/RSV plus assay is intended as an aid in the diagnosis of influenza from Nasopharyngeal swab specimens and should not be used as Myasia Sinatra sole basis for treatment. Nasal washings and aspirates are unacceptable for Xpert Xpress SARS-CoV-2/FLU/RSV testing.  Fact Sheet for Patients: EntrepreneurPulse.com.au  Fact Sheet for Healthcare Providers: IncredibleEmployment.be  This test is not yet approved or cleared by the [place] FDA and has been authorized for detection and/or diagnosis of SARS-CoV-2 by FDA under an Emergency Use Authorization (EUA). This EUA will remain in effect (meaning this test can be used) for the duration of  the COVID-19 declaration under Section 564(b)(1) of the Act, 21 U.S.C. section 360bbb-3(b)(1), unless the authorization is terminated or revoked.  Performed at [facility], [place] [address], [place], [place] [address]          Radiology Studies: CT Head Wo Contrast  Result Date: 12/11/2020 CLINICAL DATA:  Provided history: Delirium. Additional history provided: Aphasia, uneven pupils. EXAM: CT HEAD WITHOUT CONTRAST TECHNIQUE: Contiguous axial images were obtained from the base of the skull through the vertex without intravenous contrast. COMPARISON:  Noncontrast head CT 10/17/2020. MRI/MRA head 10/18/2020. CT angiogram head/neck 10/18/2020. FINDINGS: Brain: Mild generalized cerebral and cerebellar atrophy. Redemonstrated large chronic infarct within the left frontal lobe, superior left temporal lobe, left insula and left basal ganglia.  An additional moderate-sized acute cortical/subcortical left MCA territory infarct within the left temporoparietal junction has increased in extent as compared to prior examinations, and appears subacute or chronic. Background mild patchy and ill-defined hypoattenuation within the cerebral white matter, nonspecific but compatible chronic small vessel ischemic disease. Redemonstrated chronic lacunar infarcts within the pons. Redemonstration of multiple chronic infarcts within the bilateral cerebellar hemispheres. There is no acute intracranial hemorrhage. No extra-axial fluid collection. No evidence of an intracranial mass. No midline shift. Vascular: No hyperdense vessel.  Atherosclerotic calcifications. Skull: Normal. Negative for fracture or focal lesion. Sinuses/Orbits: Visualized orbits show no acute finding. Redemonstrated chronic bilateral medially displaced fracture deformities of the lamina papyracea. Redemonstrated chronic nasal bone fracture deformities. No significant paranasal sinus disease at the imaged levels. IMPRESSION: Moderate-sized left MCA  territory cortical/subcortical infarct within the left temporoparietal junction, increased in extent as compared to the prior brain MRI of 10/10/2020, but subacute to chronic in appearance. Additional large chronic left MCA territory infarct within the left frontal and temporal lobes as well as left insula and left basal ganglia, not significantly changed in extent as compared to the prior MRI. Background mild generalized parenchymal atrophy and cerebral white matter chronic small vessel ischemic disease. Redemonstrated chronic lacunar infarcts within the pons. Redemonstration of multiple chronic infarcts within bilateral cerebellar hemispheres. Electronically Signed   By: Kellie Simmering DO   On: 12/16/2020 14:51   CT Angio Chest PE W and/or Wo Contrast  Result Date: 12/19/2020 CLINICAL DATA:  Altered mental status.  Shortness of breath. EXAM: CT ANGIOGRAPHY CHEST CT ABDOMEN AND PELVIS WITH CONTRAST TECHNIQUE: Multidetector CT imaging of the chest was performed using the standard protocol during bolus administration of intravenous contrast. Multiplanar CT image reconstructions and MIPs were obtained to evaluate the vascular anatomy. Multidetector CT imaging of the abdomen and pelvis was performed using the standard protocol during bolus administration of intravenous contrast. CONTRAST:  22mL OMNIPAQUE IOHEXOL 350 MG/ML SOLN COMPARISON:  CT abdomen pelvis dated Sep 25, 2013. CT chest dated November 20, 2010. FINDINGS: CTA CHEST FINDINGS Cardiovascular: Satisfactory opacification of the pulmonary arteries to the segmental level. There is some mixing artifact within the left and right pulmonary arteries, but there is also some chronic appearing wall adherent thrombus in the mid right pulmonary artery (series 7, image 34), distal left pulmonary artery (series 4, image 37; series 7, image 49), and Margaretann Abate right lower lobe segmental pulmonary artery (series 4, image 56). Markedly enlarged pulmonary arteries. Marked cardiomegaly.  No pericardial effusion. No thoracic aortic aneurysm. Coronary, aortic arch, and branch vessel atherosclerotic vascular disease. Reflux of contrast into the IVC and hepatic veins. Mediastinum/Nodes: No enlarged mediastinal, hilar, or axillary lymph nodes. Thyroid gland, trachea, and esophagus demonstrate no significant findings. Lungs/Pleura: Small right pleural effusion. Subsegmental atelectasis in the right lower lobe. No consolidation or pneumothorax. Musculoskeletal: No chest wall abnormality. No acute or significant osseous findings. Review of the MIP images confirms the above findings. CT ABDOMEN AND PELVIS FINDINGS Hepatobiliary: No focal liver abnormality. Multiple small gallstones again noted. No gallbladder wall thickening or biliary dilatation. Pancreas: Unremarkable. No pancreatic ductal dilatation or surrounding inflammatory changes. Spleen: Normal in size without focal abnormality. Adrenals/Urinary Tract: Adrenal glands are unremarkable. Mild bilateral renal atrophy. No renal lesion, calculi, or hydronephrosis. The bladder is unremarkable. Stomach/Bowel: Stomach is within normal limits. Appendix appears normal. No evidence of bowel wall thickening, distention, or inflammatory changes. Left-sided colonic diverticulosis. Large stool ball in the rectum. Vascular/Lymphatic: Aortic atherosclerosis. No enlarged abdominal or  pelvic lymph nodes. Reproductive: Multiple uterine fibroids, some calcified. No adnexal mass. Other: No free fluid or pneumoperitoneum. Moderate fat containing paraumbilical and infraumbilical ventral abdominal hernias. Musculoskeletal: No acute or significant osseous findings. Review of the MIP images confirms the above findings. IMPRESSION: CHEST: 1. No evidence of acute pulmonary embolism. Small volume chronic appearing wall adherent thrombus in the mid right pulmonary artery, distal left pulmonary artery, and Cathie Bonnell right lower lobe segmental pulmonary artery. 2. Markedly enlarged  pulmonary arteries with reflux of contrast into the IVC and hepatic veins, consistent with right heart dysfunction. 3. Marked cardiomegaly. 4. Small right pleural effusion. 5.  Aortic atherosclerosis (ICD10-I70.0). ABDOMEN AND PELVIS: 1. No acute intra-abdominal process. 2. Cholelithiasis. 3. Large stool ball in the rectum. Correlate for fecal impaction. 4. Fibroid uterus. Electronically Signed   By: Titus Dubin M.D.   On: 12/18/2020 19:08   CT ABDOMEN PELVIS W CONTRAST  Result Date: 12/15/2020 CLINICAL DATA:  Altered mental status.  Shortness of breath. EXAM: CT ANGIOGRAPHY CHEST CT ABDOMEN AND PELVIS WITH CONTRAST TECHNIQUE: Multidetector CT imaging of the chest was performed using the standard protocol during bolus administration of intravenous contrast. Multiplanar CT image reconstructions and MIPs were obtained to evaluate the vascular anatomy. Multidetector CT imaging of the abdomen and pelvis was performed using the standard protocol during bolus administration of intravenous contrast. CONTRAST:  23mL OMNIPAQUE IOHEXOL 350 MG/ML SOLN COMPARISON:  CT abdomen pelvis dated Sep 25, 2013. CT chest dated November 20, 2010. FINDINGS: CTA CHEST FINDINGS Cardiovascular: Satisfactory opacification of the pulmonary arteries to the segmental level. There is some mixing artifact within the left and right pulmonary arteries, but there is also some chronic appearing wall adherent thrombus in the mid right pulmonary artery (series 7, image 34), distal left pulmonary artery (series 4, image 37; series 7, image 49), and Dorissa Stinnette right lower lobe segmental pulmonary artery (series 4, image 56). Markedly enlarged pulmonary arteries. Marked cardiomegaly. No pericardial effusion. No thoracic aortic aneurysm. Coronary, aortic arch, and branch vessel atherosclerotic vascular disease. Reflux of contrast into the IVC and hepatic veins. Mediastinum/Nodes: No enlarged mediastinal, hilar, or axillary lymph nodes. Thyroid gland, trachea, and  esophagus demonstrate no significant findings. Lungs/Pleura: Small right pleural effusion. Subsegmental atelectasis in the right lower lobe. No consolidation or pneumothorax. Musculoskeletal: No chest wall abnormality. No acute or significant osseous findings. Review of the MIP images confirms the above findings. CT ABDOMEN AND PELVIS FINDINGS Hepatobiliary: No focal liver abnormality. Multiple small gallstones again noted. No gallbladder wall thickening or biliary dilatation. Pancreas: Unremarkable. No pancreatic ductal dilatation or surrounding inflammatory changes. Spleen: Normal in size without focal abnormality. Adrenals/Urinary Tract: Adrenal glands are unremarkable. Mild bilateral renal atrophy. No renal lesion, calculi, or hydronephrosis. The bladder is unremarkable. Stomach/Bowel: Stomach is within normal limits. Appendix appears normal. No evidence of bowel wall thickening, distention, or inflammatory changes. Left-sided colonic diverticulosis. Large stool ball in the rectum. Vascular/Lymphatic: Aortic atherosclerosis. No enlarged abdominal or pelvic lymph nodes. Reproductive: Multiple uterine fibroids, some calcified. No adnexal mass. Other: No free fluid or pneumoperitoneum. Moderate fat containing paraumbilical and infraumbilical ventral abdominal hernias. Musculoskeletal: No acute or significant osseous findings. Review of the MIP images confirms the above findings. IMPRESSION: CHEST: 1. No evidence of acute pulmonary embolism. Small volume chronic appearing wall adherent thrombus in the mid right pulmonary artery, distal left pulmonary artery, and Reginald Mangels right lower lobe segmental pulmonary artery. 2. Markedly enlarged pulmonary arteries with reflux of contrast into the IVC and hepatic veins, consistent with  right heart dysfunction. 3. Marked cardiomegaly. 4. Small right pleural effusion. 5.  Aortic atherosclerosis (ICD10-I70.0). ABDOMEN AND PELVIS: 1. No acute intra-abdominal process. 2. Cholelithiasis.  3. Large stool ball in the rectum. Correlate for fecal impaction. 4. Fibroid uterus. Electronically Signed   By: Titus Dubin M.D.   On: 11/28/2020 19:08   DG Chest Port 1 View  Result Date: 12/04/2020 CLINICAL DATA:  Shortness of breath. EXAM: PORTABLE CHEST 1 VIEW COMPARISON:  Single-view of the chest 10/29/2020. PA and lateral chest 03/02/2013. FINDINGS: There is marked enlargement of the cardiopericardial silhouette consistent with cardiomegaly and possible pericardial effusion. Lungs are clear. Aortic atherosclerosis. No pneumothorax or pleural effusion. IMPRESSION: Marked enlargement of the cardiopericardial silhouette consistent with cardiomegaly and possibly Avin Upperman pericardial effusion. Lungs clear. Aortic Atherosclerosis (ICD10-I70.0). Electronically Signed   By: Inge Rise M.D.   On: 11/27/2020 14:30        Scheduled Meds:  furosemide  80 mg Intravenous BID   Continuous Infusions:  amiodarone 30 mg/hr (11/28/2020 0831)   cefTRIAXone (ROCEPHIN)  IV Stopped (12/10/2020 0056)   heparin 1,200 Units/hr (12/18/2020 0409)     LOS: 0 days    Time spent: over 30 min    Fayrene Helper, MD [facility]   To contact the attending provider between 7A-7P or the covering provider during after hours 7P-7A, please log into the web site [email or web address] and access using universal [place] password for that web site. If you do not have the password, please call the hospital operator.  12/03/2020, 9:37 AM

## 2020-12-06 NOTE — Progress Notes (Signed)
Critical aptt  Md notified  Pharmacy notified

## 2020-12-06 NOTE — Progress Notes (Signed)
Cardiology Progress Note  Patient ID: Angelica Strickland MRN: [id number] DOB: 1956/01/14 Date of Encounter: 12/20/2020  Primary Cardiologist: None  Subjective   Chief Complaint: Not able to communicate.  Tachypnea noted.  HPI: CT scan shows volume overload and likely chronic pulmonary emboli.  Started on heparin.  Creatinine is rising.  Remains hypotensive.  Diminished urine output.  She is likely in low output heart failure without any options.  Discussed this with the family yesterday.  Really needs to transition to comfort measures.  ROS:  All other ROS reviewed and negative. Pertinent positives noted in the HPI.     Inpatient Medications  Scheduled Meds:  furosemide  80 mg Intravenous BID   Continuous Infusions:  amiodarone 30 mg/hr (11/22/2020 0831)   cefTRIAXone (ROCEPHIN)  IV Stopped (12/20/2020 0056)   heparin 1,200 Units/hr (12/16/2020 0409)   PRN Meds:    Vital Signs   Vitals:   12/02/2020 0100 12/02/2020 0300 11/29/2020 0400 12/21/2020 0900  BP: (!) 84/74 (!) 77/56 (!) 82/67 (!) 85/69  Pulse: (!) 115     Resp: (!) 34     Temp:      TempSrc:      SpO2: 95%     Weight:      Height:        Intake/Output Summary (Last 24 hours) at 12/09/2020 0910 Last data filed at 12/03/2020 0409 Gross per 24 hour  Intake 1565.89 ml  Output 250 ml  Net 1315.89 ml   Last 3 Weights 12/17/2020 12/11/2020 10/26/2020  Weight (lbs) 171 lb 1.2 oz 216 lb 4.3 oz 216 lb 4.3 oz  Weight (kg) 77.6 kg 98.1 kg 98.1 kg      Telemetry  Overnight telemetry shows A. fib heart rates in the 120s, which I personally reviewed.   Physical Exam   Vitals:   11/23/2020 0100 12/04/2020 0300 11/25/2020 0400 11/23/2020 0900  BP: (!) 84/74 (!) 77/56 (!) 82/67 (!) 85/69  Pulse: (!) 115     Resp: (!) 34     Temp:      TempSrc:      SpO2: 95%     Weight:      Height:        Intake/Output Summary (Last 24 hours) at 11/30/2020 0910 Last data filed at 12/19/2020 0409 Gross per 24 hour  Intake 1565.89 ml  Output 250 ml   Net 1315.89 ml    Last 3 Weights 12/17/2020 11/28/2020 10/26/2020  Weight (lbs) 171 lb 1.2 oz 216 lb 4.3 oz 216 lb 4.3 oz  Weight (kg) 77.6 kg 98.1 kg 98.1 kg    Body mass index is 26.79 kg/m.   General: Ill-appearing Head: Atraumatic, normal size  Eyes: PEERLA, EOMI  Neck: Supple, JVD 15 to 17 cm of water Endocrine: No thryomegaly Cardiac: Normal S1, S2; irregular rhythm, tachycardia Lungs: Crackles of the lung bases Abd: Distended abdomen Ext: Trace edema Musculoskeletal: No deformities Skin: Warm and dry, no rashes   Neuro: Alert, awake, unable to communicate, right-sided weakness noted  Labs  High Sensitivity Troponin:   Recent Labs  Lab 12/08/2020 1528  TROPONINIHS 35*     Cardiac EnzymesNo results for input(s): TROPONINI in the last 168 hours. No results for input(s): TROPIPOC in the last 168 hours.  Chemistry Recent Labs  Lab 11/22/2020 1528 11/22/2020 0630  NA 136 134*  K 4.2 6.1*  CL 103 103  CO2 24 16*  GLUCOSE 96 82  BUN 51* 55*  CREATININE 1.48* 1.72*  CALCIUM  8.7* 8.7*  PROT 6.5  --   ALBUMIN 3.2*  --   AST 28  --   ALT 22  --   ALKPHOS 79  --   BILITOT 1.6*  --   GFRNONAA 39* 32*  ANIONGAP 9 15    Hematology Recent Labs  Lab 11/23/2020 1336  WBC 6.1  RBC 3.42*  HGB 11.0*  HCT 35.3*  MCV 103.2*  MCH 32.2  MCHC 31.2  RDW 16.7*  PLT 168   BNP Recent Labs  Lab 11/28/2020 1528  BNP 1,947.9*    DDimer No results for input(s): DDIMER in the last 168 hours.   Radiology  CT Head Wo Contrast  Result Date: 12/01/2020 CLINICAL DATA:  Provided history: Delirium. Additional history provided: Aphasia, uneven pupils. EXAM: CT HEAD WITHOUT CONTRAST TECHNIQUE: Contiguous axial images were obtained from the base of the skull through the vertex without intravenous contrast. COMPARISON:  Noncontrast head CT 10/17/2020. MRI/MRA head 10/18/2020. CT angiogram head/neck 10/18/2020. FINDINGS: Brain: Mild generalized cerebral and cerebellar atrophy. Redemonstrated  large chronic infarct within the left frontal lobe, superior left temporal lobe, left insula and left basal ganglia. An additional moderate-sized acute cortical/subcortical left MCA territory infarct within the left temporoparietal junction has increased in extent as compared to prior examinations, and appears subacute or chronic. Background mild patchy and ill-defined hypoattenuation within the cerebral white matter, nonspecific but compatible chronic small vessel ischemic disease. Redemonstrated chronic lacunar infarcts within the pons. Redemonstration of multiple chronic infarcts within the bilateral cerebellar hemispheres. There is no acute intracranial hemorrhage. No extra-axial fluid collection. No evidence of an intracranial mass. No midline shift. Vascular: No hyperdense vessel.  Atherosclerotic calcifications. Skull: Normal. Negative for fracture or focal lesion. Sinuses/Orbits: Visualized orbits show no acute finding. Redemonstrated chronic bilateral medially displaced fracture deformities of the lamina papyracea. Redemonstrated chronic nasal bone fracture deformities. No significant paranasal sinus disease at the imaged levels. IMPRESSION: Moderate-sized left MCA territory cortical/subcortical infarct within the left temporoparietal junction, increased in extent as compared to the prior brain MRI of 10/10/2020, but subacute to chronic in appearance. Additional large chronic left MCA territory infarct within the left frontal and temporal lobes as well as left insula and left basal ganglia, not significantly changed in extent as compared to the prior MRI. Background mild generalized parenchymal atrophy and cerebral white matter chronic small vessel ischemic disease. Redemonstrated chronic lacunar infarcts within the pons. Redemonstration of multiple chronic infarcts within bilateral cerebellar hemispheres. Electronically Signed   By: Kellie Simmering DO   On: 12/12/2020 14:51   CT Angio Chest PE W and/or Wo  Contrast  Result Date: 12/17/2020 CLINICAL DATA:  Altered mental status.  Shortness of breath. EXAM: CT ANGIOGRAPHY CHEST CT ABDOMEN AND PELVIS WITH CONTRAST TECHNIQUE: Multidetector CT imaging of the chest was performed using the standard protocol during bolus administration of intravenous contrast. Multiplanar CT image reconstructions and MIPs were obtained to evaluate the vascular anatomy. Multidetector CT imaging of the abdomen and pelvis was performed using the standard protocol during bolus administration of intravenous contrast. CONTRAST:  63mL OMNIPAQUE IOHEXOL 350 MG/ML SOLN COMPARISON:  CT abdomen pelvis dated Sep 25, 2013. CT chest dated November 20, 2010. FINDINGS: CTA CHEST FINDINGS Cardiovascular: Satisfactory opacification of the pulmonary arteries to the segmental level. There is some mixing artifact within the left and right pulmonary arteries, but there is also some chronic appearing wall adherent thrombus in the mid right pulmonary artery (series 7, image 34), distal left pulmonary artery (series 4, image  40; series 7, image 64), and a right lower lobe segmental pulmonary artery (series 4, image 56). Markedly enlarged pulmonary arteries. Marked cardiomegaly. No pericardial effusion. No thoracic aortic aneurysm. Coronary, aortic arch, and branch vessel atherosclerotic vascular disease. Reflux of contrast into the IVC and hepatic veins. Mediastinum/Nodes: No enlarged mediastinal, hilar, or axillary lymph nodes. Thyroid gland, trachea, and esophagus demonstrate no significant findings. Lungs/Pleura: Small right pleural effusion. Subsegmental atelectasis in the right lower lobe. No consolidation or pneumothorax. Musculoskeletal: No chest wall abnormality. No acute or significant osseous findings. Review of the MIP images confirms the above findings. CT ABDOMEN AND PELVIS FINDINGS Hepatobiliary: No focal liver abnormality. Multiple small gallstones again noted. No gallbladder wall thickening or biliary  dilatation. Pancreas: Unremarkable. No pancreatic ductal dilatation or surrounding inflammatory changes. Spleen: Normal in size without focal abnormality. Adrenals/Urinary Tract: Adrenal glands are unremarkable. Mild bilateral renal atrophy. No renal lesion, calculi, or hydronephrosis. The bladder is unremarkable. Stomach/Bowel: Stomach is within normal limits. Appendix appears normal. No evidence of bowel wall thickening, distention, or inflammatory changes. Left-sided colonic diverticulosis. Large stool ball in the rectum. Vascular/Lymphatic: Aortic atherosclerosis. No enlarged abdominal or pelvic lymph nodes. Reproductive: Multiple uterine fibroids, some calcified. No adnexal mass. Other: No free fluid or pneumoperitoneum. Moderate fat containing paraumbilical and infraumbilical ventral abdominal hernias. Musculoskeletal: No acute or significant osseous findings. Review of the MIP images confirms the above findings. IMPRESSION: CHEST: 1. No evidence of acute pulmonary embolism. Small volume chronic appearing wall adherent thrombus in the mid right pulmonary artery, distal left pulmonary artery, and a right lower lobe segmental pulmonary artery. 2. Markedly enlarged pulmonary arteries with reflux of contrast into the IVC and hepatic veins, consistent with right heart dysfunction. 3. Marked cardiomegaly. 4. Small right pleural effusion. 5.  Aortic atherosclerosis (ICD10-I70.0). ABDOMEN AND PELVIS: 1. No acute intra-abdominal process. 2. Cholelithiasis. 3. Large stool ball in the rectum. Correlate for fecal impaction. 4. Fibroid uterus. Electronically Signed   By: Titus Dubin M.D.   On: 12/03/2020 19:08   CT ABDOMEN PELVIS W CONTRAST  Result Date: 11/21/2020 CLINICAL DATA:  Altered mental status.  Shortness of breath. EXAM: CT ANGIOGRAPHY CHEST CT ABDOMEN AND PELVIS WITH CONTRAST TECHNIQUE: Multidetector CT imaging of the chest was performed using the standard protocol during bolus administration of  intravenous contrast. Multiplanar CT image reconstructions and MIPs were obtained to evaluate the vascular anatomy. Multidetector CT imaging of the abdomen and pelvis was performed using the standard protocol during bolus administration of intravenous contrast. CONTRAST:  39mL OMNIPAQUE IOHEXOL 350 MG/ML SOLN COMPARISON:  CT abdomen pelvis dated Sep 25, 2013. CT chest dated November 20, 2010. FINDINGS: CTA CHEST FINDINGS Cardiovascular: Satisfactory opacification of the pulmonary arteries to the segmental level. There is some mixing artifact within the left and right pulmonary arteries, but there is also some chronic appearing wall adherent thrombus in the mid right pulmonary artery (series 7, image 34), distal left pulmonary artery (series 4, image 37; series 7, image 49), and a right lower lobe segmental pulmonary artery (series 4, image 56). Markedly enlarged pulmonary arteries. Marked cardiomegaly. No pericardial effusion. No thoracic aortic aneurysm. Coronary, aortic arch, and branch vessel atherosclerotic vascular disease. Reflux of contrast into the IVC and hepatic veins. Mediastinum/Nodes: No enlarged mediastinal, hilar, or axillary lymph nodes. Thyroid gland, trachea, and esophagus demonstrate no significant findings. Lungs/Pleura: Small right pleural effusion. Subsegmental atelectasis in the right lower lobe. No consolidation or pneumothorax. Musculoskeletal: No chest wall abnormality. No acute or significant osseous findings. Review  of the MIP images confirms the above findings. CT ABDOMEN AND PELVIS FINDINGS Hepatobiliary: No focal liver abnormality. Multiple small gallstones again noted. No gallbladder wall thickening or biliary dilatation. Pancreas: Unremarkable. No pancreatic ductal dilatation or surrounding inflammatory changes. Spleen: Normal in size without focal abnormality. Adrenals/Urinary Tract: Adrenal glands are unremarkable. Mild bilateral renal atrophy. No renal lesion, calculi, or  hydronephrosis. The bladder is unremarkable. Stomach/Bowel: Stomach is within normal limits. Appendix appears normal. No evidence of bowel wall thickening, distention, or inflammatory changes. Left-sided colonic diverticulosis. Large stool ball in the rectum. Vascular/Lymphatic: Aortic atherosclerosis. No enlarged abdominal or pelvic lymph nodes. Reproductive: Multiple uterine fibroids, some calcified. No adnexal mass. Other: No free fluid or pneumoperitoneum. Moderate fat containing paraumbilical and infraumbilical ventral abdominal hernias. Musculoskeletal: No acute or significant osseous findings. Review of the MIP images confirms the above findings. IMPRESSION: CHEST: 1. No evidence of acute pulmonary embolism. Small volume chronic appearing wall adherent thrombus in the mid right pulmonary artery, distal left pulmonary artery, and a right lower lobe segmental pulmonary artery. 2. Markedly enlarged pulmonary arteries with reflux of contrast into the IVC and hepatic veins, consistent with right heart dysfunction. 3. Marked cardiomegaly. 4. Small right pleural effusion. 5.  Aortic atherosclerosis (ICD10-I70.0). ABDOMEN AND PELVIS: 1. No acute intra-abdominal process. 2. Cholelithiasis. 3. Large stool ball in the rectum. Correlate for fecal impaction. 4. Fibroid uterus. Electronically Signed   By: Titus Dubin M.D.   On: 11/22/2020 19:08   DG Chest Port 1 View  Result Date: 12/12/2020 CLINICAL DATA:  Shortness of breath. EXAM: PORTABLE CHEST 1 VIEW COMPARISON:  Single-view of the chest 10/29/2020. PA and lateral chest 03/02/2013. FINDINGS: There is marked enlargement of the cardiopericardial silhouette consistent with cardiomegaly and possible pericardial effusion. Lungs are clear. Aortic atherosclerosis. No pneumothorax or pleural effusion. IMPRESSION: Marked enlargement of the cardiopericardial silhouette consistent with cardiomegaly and possibly a pericardial effusion. Lungs clear. Aortic Atherosclerosis  (ICD10-I70.0). Electronically Signed   By: Inge Rise M.D.   On: 12/21/2020 14:30    Cardiac Studies  TTE 10/19/2020  1. Left ventricular ejection fraction, by estimation, is 20 to 25%. The  left ventricle has severely decreased function. The left ventricle  demonstrates global hypokinesis. The left ventricular internal cavity size  was severely dilated. Left ventricular  diastolic function could not be evaluated. Elevated left ventricular  end-diastolic pressure.   2. Right ventricular systolic function is mildly reduced. The right  ventricular size is mildly enlarged. There is moderately elevated  pulmonary artery systolic pressure.   3. Left atrial size was severely dilated.   4. Right atrial size was severely dilated.   5. The mitral valve is abnormal. Severe mitral valve regurgitation.   6. Tricuspid valve regurgitation is moderate.   7. The aortic valve is tricuspid. Aortic valve regurgitation is moderate  to severe.   8. The inferior vena cava is normal in size with <50% respiratory  variability, suggesting right atrial pressure of 8 mmHg.  Patient Profile  Angelica Strickland is a 65 y.o. 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  She is bedridden.  She is nonverbal.  She was unable to progress  with physical therapy.  She is currently DNR/DNI. -Admitted with worsening shortness of breath and volume overload.  Also with A. fib with RVR.  Blood pressures are marginal.  Lactic acid was normal.  Does not appear to be in shock but is clearly in low  output congestive heart failure. -She has a rising creatinine with reduced urine output.  AKI likely related to systolic heart failure and contrast load from CT scan yesterday.  Under normal circumstances would recommend pressors as well as likely inotropes.  However given her overall poor state of health I recommended against this yesterday.  I did discuss with her Sister [name] that she is critically ill and likely will not survive this.  She informed me that she is not interested in aggressive care for her.  She is DNR/DNI.  Yesterday we made a recommendation for hospice care.  That recommendation remains the same today. -For A. fib with RVR she can just continue amiodarone drip.  Unfortunately we will have to hold digoxin due to rising creatinine.  She is on heparin for chronic pulmonary emboli.  Again this does not need to be aggressively treated.  If we are going to hospice care would focus on comfort measures. -No beta-blocker.  No ACE/ARB/Arni/MRA given AKI and end-stage cardiomyopathy. -All we can offer is supportive care. -Can continue to attempt diuresis however I suspect with her AKI she will not respond.  2.  A. fib with RVR -This is secondary to her low output cardiomyopathy.  Can continue amiodarone for now.  Stop digoxin given AKI.  On heparin drip.  3.  AKI -Multifactorial in the setting of decompensated systolic heart failure and contrast load from CT scans. -Can continue to attempt diuresis however unclear if this will be beneficial in the setting of severe AKI.  [facility] will sign off.   Medication Recommendations: Amiodarone for comfort.  Attempt diuresis for comfort.  All of her cardiac medications are comfort.  She is appropriate for comfort care. Other recommendations (labs, testing, etc): None. Follow up as an outpatient: None needed.  Anticipate transition to hospice.  For questions or updates, please contact [place] Please consult [email or web address] for  contact info under   Time Spent with Patient: I have spent a total of 25 minutes with patient reviewing hospital notes, telemetry, EKGs, labs and examining the patient as well as establishing an assessment and plan that was discussed with the patient.  > 50% of time was spent in direct patient care.    Signed, Addison Naegeli. Audie Box, MD, [place]  12/19/2020 9:10 AM

## 2020-12-07 LAB — URINE CULTURE

## 2020-12-10 LAB — CULTURE, BLOOD (ROUTINE X 2): Culture: NO GROWTH

## 2020-12-18 ENCOUNTER — Inpatient Hospital Stay: Payer: Medicare Other | Admitting: Adult Health

## 2020-12-22 NOTE — Death Summary Note (Signed)
DEATH SUMMARY   Patient Details  Name: Angelica Strickland MRN: [id number] DOB: 08-Oct-1955  Admission/Discharge Information   Admit Date:  Dec 10, 2020  Date of Death: Date of Death: 12-11-20  Time of Death: Time of Death: 14-Aug-2248  Length of Stay: 1  Referring Physician: Birdie Sons, MD   Reason(s) for Hospitalization  Acute on chronic systolic heart failure  Diagnoses  Preliminary cause of death: acute on chronic systolic heart failure Secondary Diagnoses (including complications and co-morbidities):  Principal Problem:   Hypotension Active Problems:   Acute CHF (congestive heart failure) (HCC)   Atrial fibrillation with RVR ([place])   Acute-on-chronic kidney injury ([place])   History of CVA (cerebrovascular accident)   Acute metabolic encephalopathy   Acute lower UTI   DNR (do not resuscitate)   Heart failure [facility])   Brief Hospital Course (including significant findings, care, treatment, and services provided and events leading to death)   NIREL BABLER is Liley Rake 65 y.o. female with medical history significant for hx of recent CVA (05/8297), systolic heart failure (EF 20%, paroxysmal atrial fibrillation on Eliquis, hypertension, thrombocytopenia, COPD who presents from skilled nursing facility for concerns of altered mental status.  She was discharged in June for Maryanna Stuber stroke with resultant expressive aphasia.  She presented during this admission with acute metabolic encephalopathy with hypotension and tachycardia.   She was diagnosed with low output heart failure.  Cardiology was c/s, she was started on lasix, amiodarone, and heparin.  Discussions regarding hospice/comfort measures were undergoing on admission given her critical illness and comorbidities.  On 12-12-2022, after discussion with her sister [name], she was transitioned to comfort measures.  She passed away at 2250 on 12/12/2022.    See previous notes for additional details   Pertinent Labs and Studies  Significant Diagnostic Studies CT Head  Wo Contrast  Result Date: 2020-12-10 CLINICAL DATA:  Provided history: Delirium. Additional history provided: Aphasia, uneven pupils. EXAM: CT HEAD WITHOUT CONTRAST TECHNIQUE: Contiguous axial images were obtained from the base of the skull through the vertex without intravenous contrast. COMPARISON:  Noncontrast head CT 10/17/2020. MRI/MRA head 10/18/2020. CT angiogram head/neck 10/18/2020. FINDINGS: Brain: Mild generalized cerebral and cerebellar atrophy. Redemonstrated large chronic infarct within the left frontal lobe, superior left temporal lobe, left insula and left basal ganglia. An additional moderate-sized acute cortical/subcortical left MCA territory infarct within the left temporoparietal junction has increased in extent as compared to prior examinations, and appears subacute or chronic. Background mild patchy and ill-defined hypoattenuation within the cerebral white matter, nonspecific but compatible chronic small vessel ischemic disease. Redemonstrated chronic lacunar infarcts within the pons. Redemonstration of multiple chronic infarcts within the bilateral cerebellar hemispheres. There is no acute intracranial hemorrhage. No extra-axial fluid collection. No evidence of an intracranial mass. No midline shift. Vascular: No hyperdense vessel.  Atherosclerotic calcifications. Skull: Normal. Negative for fracture or focal lesion. Sinuses/Orbits: Visualized orbits show no acute finding. Redemonstrated chronic bilateral medially displaced fracture deformities of the lamina papyracea. Redemonstrated chronic nasal bone fracture deformities. No significant paranasal sinus disease at the imaged levels. IMPRESSION: Moderate-sized left MCA territory cortical/subcortical infarct within the left temporoparietal junction, increased in extent as compared to the prior brain MRI of 10/10/2020, but subacute to chronic in appearance. Additional large chronic left MCA territory infarct within the left frontal and  temporal lobes as well as left insula and left basal ganglia, not significantly changed in extent as compared to the prior MRI. Background mild generalized parenchymal atrophy and cerebral white matter chronic small  vessel ischemic disease. Redemonstrated chronic lacunar infarcts within the pons. Redemonstration of multiple chronic infarcts within bilateral cerebellar hemispheres. Electronically Signed   By: Kellie Simmering DO   On: 11/21/2020 14:51   CT Angio Chest PE W and/or Wo Contrast  Result Date: 12/17/2020 CLINICAL DATA:  Altered mental status.  Shortness of breath. EXAM: CT ANGIOGRAPHY CHEST CT ABDOMEN AND PELVIS WITH CONTRAST TECHNIQUE: Multidetector CT imaging of the chest was performed using the standard protocol during bolus administration of intravenous contrast. Multiplanar CT image reconstructions and MIPs were obtained to evaluate the vascular anatomy. Multidetector CT imaging of the abdomen and pelvis was performed using the standard protocol during bolus administration of intravenous contrast. CONTRAST:  23mL OMNIPAQUE IOHEXOL 350 MG/ML SOLN COMPARISON:  CT abdomen pelvis dated Sep 25, 2013. CT chest dated November 20, 2010. FINDINGS: CTA CHEST FINDINGS Cardiovascular: Satisfactory opacification of the pulmonary arteries to the segmental level. There is some mixing artifact within the left and right pulmonary arteries, but there is also some chronic appearing wall adherent thrombus in the mid right pulmonary artery (series 7, image 34), distal left pulmonary artery (series 4, image 37; series 7, image 49), and Sanvi Ehler right lower lobe segmental pulmonary artery (series 4, image 56). Markedly enlarged pulmonary arteries. Marked cardiomegaly. No pericardial effusion. No thoracic aortic aneurysm. Coronary, aortic arch, and branch vessel atherosclerotic vascular disease. Reflux of contrast into the IVC and hepatic veins. Mediastinum/Nodes: No enlarged mediastinal, hilar, or axillary lymph nodes. Thyroid gland,  trachea, and esophagus demonstrate no significant findings. Lungs/Pleura: Small right pleural effusion. Subsegmental atelectasis in the right lower lobe. No consolidation or pneumothorax. Musculoskeletal: No chest wall abnormality. No acute or significant osseous findings. Review of the MIP images confirms the above findings. CT ABDOMEN AND PELVIS FINDINGS Hepatobiliary: No focal liver abnormality. Multiple small gallstones again noted. No gallbladder wall thickening or biliary dilatation. Pancreas: Unremarkable. No pancreatic ductal dilatation or surrounding inflammatory changes. Spleen: Normal in size without focal abnormality. Adrenals/Urinary Tract: Adrenal glands are unremarkable. Mild bilateral renal atrophy. No renal lesion, calculi, or hydronephrosis. The bladder is unremarkable. Stomach/Bowel: Stomach is within normal limits. Appendix appears normal. No evidence of bowel wall thickening, distention, or inflammatory changes. Left-sided colonic diverticulosis. Large stool ball in the rectum. Vascular/Lymphatic: Aortic atherosclerosis. No enlarged abdominal or pelvic lymph nodes. Reproductive: Multiple uterine fibroids, some calcified. No adnexal mass. Other: No free fluid or pneumoperitoneum. Moderate fat containing paraumbilical and infraumbilical ventral abdominal hernias. Musculoskeletal: No acute or significant osseous findings. Review of the MIP images confirms the above findings. IMPRESSION: CHEST: 1. No evidence of acute pulmonary embolism. Small volume chronic appearing wall adherent thrombus in the mid right pulmonary artery, distal left pulmonary artery, and Yaasir Menken right lower lobe segmental pulmonary artery. 2. Markedly enlarged pulmonary arteries with reflux of contrast into the IVC and hepatic veins, consistent with right heart dysfunction. 3. Marked cardiomegaly. 4. Small right pleural effusion. 5.  Aortic atherosclerosis (ICD10-I70.0). ABDOMEN AND PELVIS: 1. No acute intra-abdominal process. 2.  Cholelithiasis. 3. Large stool ball in the rectum. Correlate for fecal impaction. 4. Fibroid uterus. Electronically Signed   By: Titus Dubin M.D.   On: 11/27/2020 19:08   CT ABDOMEN PELVIS W CONTRAST  Result Date: 12/14/2020 CLINICAL DATA:  Altered mental status.  Shortness of breath. EXAM: CT ANGIOGRAPHY CHEST CT ABDOMEN AND PELVIS WITH CONTRAST TECHNIQUE: Multidetector CT imaging of the chest was performed using the standard protocol during bolus administration of intravenous contrast. Multiplanar CT image reconstructions and MIPs were obtained  to evaluate the vascular anatomy. Multidetector CT imaging of the abdomen and pelvis was performed using the standard protocol during bolus administration of intravenous contrast. CONTRAST:  29mL OMNIPAQUE IOHEXOL 350 MG/ML SOLN COMPARISON:  CT abdomen pelvis dated Sep 25, 2013. CT chest dated November 20, 2010. FINDINGS: CTA CHEST FINDINGS Cardiovascular: Satisfactory opacification of the pulmonary arteries to the segmental level. There is some mixing artifact within the left and right pulmonary arteries, but there is also some chronic appearing wall adherent thrombus in the mid right pulmonary artery (series 7, image 34), distal left pulmonary artery (series 4, image 37; series 7, image 49), and Luz Burcher right lower lobe segmental pulmonary artery (series 4, image 56). Markedly enlarged pulmonary arteries. Marked cardiomegaly. No pericardial effusion. No thoracic aortic aneurysm. Coronary, aortic arch, and branch vessel atherosclerotic vascular disease. Reflux of contrast into the IVC and hepatic veins. Mediastinum/Nodes: No enlarged mediastinal, hilar, or axillary lymph nodes. Thyroid gland, trachea, and esophagus demonstrate no significant findings. Lungs/Pleura: Small right pleural effusion. Subsegmental atelectasis in the right lower lobe. No consolidation or pneumothorax. Musculoskeletal: No chest wall abnormality. No acute or significant osseous findings. Review of the  MIP images confirms the above findings. CT ABDOMEN AND PELVIS FINDINGS Hepatobiliary: No focal liver abnormality. Multiple small gallstones again noted. No gallbladder wall thickening or biliary dilatation. Pancreas: Unremarkable. No pancreatic ductal dilatation or surrounding inflammatory changes. Spleen: Normal in size without focal abnormality. Adrenals/Urinary Tract: Adrenal glands are unremarkable. Mild bilateral renal atrophy. No renal lesion, calculi, or hydronephrosis. The bladder is unremarkable. Stomach/Bowel: Stomach is within normal limits. Appendix appears normal. No evidence of bowel wall thickening, distention, or inflammatory changes. Left-sided colonic diverticulosis. Large stool ball in the rectum. Vascular/Lymphatic: Aortic atherosclerosis. No enlarged abdominal or pelvic lymph nodes. Reproductive: Multiple uterine fibroids, some calcified. No adnexal mass. Other: No free fluid or pneumoperitoneum. Moderate fat containing paraumbilical and infraumbilical ventral abdominal hernias. Musculoskeletal: No acute or significant osseous findings. Review of the MIP images confirms the above findings. IMPRESSION: CHEST: 1. No evidence of acute pulmonary embolism. Small volume chronic appearing wall adherent thrombus in the mid right pulmonary artery, distal left pulmonary artery, and Sophiea Ueda right lower lobe segmental pulmonary artery. 2. Markedly enlarged pulmonary arteries with reflux of contrast into the IVC and hepatic veins, consistent with right heart dysfunction. 3. Marked cardiomegaly. 4. Small right pleural effusion. 5.  Aortic atherosclerosis (ICD10-I70.0). ABDOMEN AND PELVIS: 1. No acute intra-abdominal process. 2. Cholelithiasis. 3. Large stool ball in the rectum. Correlate for fecal impaction. 4. Fibroid uterus. Electronically Signed   By: Titus Dubin M.D.   On: 12/08/2020 19:08   DG Chest Port 1 View  Result Date: 12/02/2020 CLINICAL DATA:  Shortness of breath. EXAM: PORTABLE CHEST 1 VIEW  COMPARISON:  Single-view of the chest 10/29/2020. PA and lateral chest 03/02/2013. FINDINGS: There is marked enlargement of the cardiopericardial silhouette consistent with cardiomegaly and possible pericardial effusion. Lungs are clear. Aortic atherosclerosis. No pneumothorax or pleural effusion. IMPRESSION: Marked enlargement of the cardiopericardial silhouette consistent with cardiomegaly and possibly Rual Vermeer pericardial effusion. Lungs clear. Aortic Atherosclerosis (ICD10-I70.0). Electronically Signed   By: Inge Rise M.D.   On: 11/28/2020 14:30    Microbiology Recent Results (from the past 240 hour(s))  Blood Culture (routine x 2)     Status: None (Preliminary result)   Collection Time: 12/21/2020  1:43 PM   Specimen: BLOOD  Result Value Ref Range Status   Specimen Description BLOOD BLOOD RIGHT HAND  Final   Special Requests  Final    BOTTLES DRAWN AEROBIC AND ANAEROBIC Blood Culture results may not be optimal due to an inadequate volume of blood received in culture bottles   Culture   Final    NO GROWTH 1 DAY Performed at [facility] [address] [place], [place] [address]    Report Status PENDING  Incomplete  Blood Culture (routine x 2)     Status: None (Preliminary result)   Collection Time: 12/13/2020  1:48 PM   Specimen: BLOOD  Result Value Ref Range Status   Specimen Description BLOOD LEFT ANTECUBITAL  Final   Special Requests   Final    AEROBIC BOTTLE ONLY Blood Culture results may not be optimal due to an inadequate volume of blood received in culture bottles   Culture  Setup Time   Final    GRAM POSITIVE COCCI AEROBIC BOTTLE ONLY CRITICAL RESULT CALLED TO, READ BACK BY AND VERIFIED WITH: M,BITONTI PHARMD @1218  12/04/2020 EB Performed at [facility], [place] [address], [place], [place] [address]    Culture GRAM POSITIVE COCCI  Final   Report Status PENDING  Incomplete  Blood Culture ID Panel (Reflexed)     Status: Abnormal   Collection Time: 12/13/2020  1:48 PM   Result Value Ref Range Status   Enterococcus faecalis NOT DETECTED NOT DETECTED Final   Enterococcus Faecium NOT DETECTED NOT DETECTED Final   Listeria monocytogenes NOT DETECTED NOT DETECTED Final   Staphylococcus species DETECTED (Myrikal Messmer) NOT DETECTED Final    Comment: CRITICAL RESULT CALLED TO, READ BACK BY AND VERIFIED WITH: M,BITONTI PHARMD @1218  11/26/2020 EB    Staphylococcus aureus (BCID) NOT DETECTED NOT DETECTED Final   Staphylococcus epidermidis DETECTED (Aluna Whiston) NOT DETECTED Final    Comment: Methicillin (oxacillin) resistant coagulase negative staphylococcus. Possible blood culture contaminant (unless isolated from more than one blood culture draw or clinical case suggests pathogenicity). No antibiotic treatment is indicated for blood  culture contaminants. CRITICAL RESULT CALLED TO, READ BACK BY AND VERIFIED WITH: M,BITONTI PHARMD @1218  12/18/2020 EB    Staphylococcus lugdunensis NOT DETECTED NOT DETECTED Final   Streptococcus species NOT DETECTED NOT DETECTED Final   Streptococcus agalactiae NOT DETECTED NOT DETECTED Final   Streptococcus pneumoniae NOT DETECTED NOT DETECTED Final   Streptococcus pyogenes NOT DETECTED NOT DETECTED Final   Voncile Schwarz.calcoaceticus-baumannii NOT DETECTED NOT DETECTED Final   Bacteroides fragilis NOT DETECTED NOT DETECTED Final   Enterobacterales NOT DETECTED NOT DETECTED Final   Enterobacter cloacae complex NOT DETECTED NOT DETECTED Final   Escherichia coli NOT DETECTED NOT DETECTED Final   Klebsiella aerogenes NOT DETECTED NOT DETECTED Final   Klebsiella oxytoca NOT DETECTED NOT DETECTED Final   Klebsiella pneumoniae NOT DETECTED NOT DETECTED Final   Proteus species NOT DETECTED NOT DETECTED Final   Salmonella species NOT DETECTED NOT DETECTED Final   Serratia marcescens NOT DETECTED NOT DETECTED Final   Haemophilus influenzae NOT DETECTED NOT DETECTED Final   Neisseria meningitidis NOT DETECTED NOT DETECTED Final   Pseudomonas aeruginosa NOT DETECTED NOT  DETECTED Final   Stenotrophomonas maltophilia NOT DETECTED NOT DETECTED Final   Candida albicans NOT DETECTED NOT DETECTED Final   Candida auris NOT DETECTED NOT DETECTED Final   Candida glabrata NOT DETECTED NOT DETECTED Final   Candida krusei NOT DETECTED NOT DETECTED Final   Candida parapsilosis NOT DETECTED NOT DETECTED Final   Candida tropicalis NOT DETECTED NOT DETECTED Final   Cryptococcus neoformans/gattii NOT DETECTED NOT DETECTED Final   Methicillin resistance mecA/C DETECTED (  Merik Mignano) NOT DETECTED Final    Comment: CRITICAL RESULT CALLED TO, READ BACK BY AND VERIFIED WITH: M,BITONTI PHARMD @1218  12/12/2020 EB Performed at [facility], [address] [place], [place] [address]   Resp Panel by RT-PCR (Flu Rasheka Denard&B, Covid) Nasopharyngeal Swab     Status: None   Collection Time: 12/03/2020  4:27 PM   Specimen: Nasopharyngeal Swab; Nasopharyngeal(NP) swabs in vial transport medium  Result Value Ref Range Status   SARS Coronavirus 2 by RT PCR NEGATIVE NEGATIVE Final    Comment: (NOTE) SARS-CoV-2 target nucleic acids are NOT DETECTED.  The SARS-CoV-2 RNA is generally detectable in upper respiratory specimens during the acute phase of infection. The lowest concentration of SARS-CoV-2 viral copies this assay can detect is 138 copies/mL. Terrilynn Postell negative result does not preclude SARS-Cov-2 infection and should not be used as the sole basis for treatment or other patient management decisions. Jemimah Cressy negative result may occur with  improper specimen collection/handling, submission of specimen other than nasopharyngeal swab, presence of viral mutation(s) within the areas targeted by this assay, and inadequate number of viral copies(<138 copies/mL). Alexandrina Fiorini negative result must be combined with clinical observations, patient history, and epidemiological information. The expected result is Negative.  Fact Sheet for Patients:  EntrepreneurPulse.com.au  Fact Sheet for Healthcare Providers:   IncredibleEmployment.be  This test is no t yet approved or cleared by the [place] FDA and  has been authorized for detection and/or diagnosis of SARS-CoV-2 by FDA under an Emergency Use Authorization (EUA). This EUA will remain  in effect (meaning this test can be used) for the duration of the COVID-19 declaration under Section 564(b)(1) of the Act, 21 U.S.C.section 360bbb-3(b)(1), unless the authorization is terminated  or revoked sooner.       Influenza Hussam Muniz by PCR NEGATIVE NEGATIVE Final   Influenza B by PCR NEGATIVE NEGATIVE Final    Comment: (NOTE) The Xpert Xpress SARS-CoV-2/FLU/RSV plus assay is intended as an aid in the diagnosis of influenza from Nasopharyngeal swab specimens and should not be used as Shakeyla Giebler sole basis for treatment. Nasal washings and aspirates are unacceptable for Xpert Xpress SARS-CoV-2/FLU/RSV testing.  Fact Sheet for Patients: EntrepreneurPulse.com.au  Fact Sheet for Healthcare Providers: IncredibleEmployment.be  This test is not yet approved or cleared by the [place] FDA and has been authorized for detection and/or diagnosis of SARS-CoV-2 by FDA under an Emergency Use Authorization (EUA). This EUA will remain in effect (meaning this test can be used) for the duration of the COVID-19 declaration under Section 564(b)(1) of the Act, 21 U.S.C. section 360bbb-3(b)(1), unless the authorization is terminated or revoked.  Performed at [facility], [place] [address], [place], [place] [address]     Lab Basic Metabolic Panel: Recent Labs  Lab 12/20/2020 1528 11/23/2020 0630  NA 136 134*  K 4.2 6.1*  CL 103 103  CO2 24 16*  GLUCOSE 96 82  BUN 51* 55*  CREATININE 1.48* 1.72*  CALCIUM 8.7* 8.7*   Liver Function Tests: Recent Labs  Lab 12/04/2020 1528  AST 28  ALT 22  ALKPHOS 79  BILITOT 1.6*  PROT 6.5  ALBUMIN 3.2*   No results for input(s): LIPASE, AMYLASE in the last 168  hours. No results for input(s): AMMONIA in the last 168 hours. CBC: Recent Labs  Lab 12/04/2020 1336  WBC 6.1  NEUTROABS 3.0  HGB 11.0*  HCT 35.3*  MCV 103.2*  PLT 168   Cardiac Enzymes: No results for input(s): CKTOTAL, CKMB, CKMBINDEX, TROPONINI  in the last 168 hours. Sepsis Labs: Recent Labs  Lab 12/16/2020 1336 12/12/2020 1746  PROCALCITON  --  0.14  WBC 6.1  --   LATICACIDVEN 1.9  --     Procedures/Operations  See previous notes   Uriyah Massimo 12/23/2020, 7:46 AM

## 2020-12-22 DEATH — deceased

## 2021-08-05 IMAGING — CT CT ANGIO CHEST
2 of 7 series · 17 of 46 positions shown · IV contrast (APPLIED)
Comparison: CT abdomen pelvis dated September 25, 2013. CT chest dated
November 20, 2010.

CLINICAL DATA: Altered mental status.  Shortness of breath.

EXAM:
CT ANGIOGRAPHY CHEST
CT ABDOMEN AND PELVIS WITH CONTRAST
TECHNIQUE: Multidetector CT imaging of the chest was performed using the
standard protocol during bolus administration of intravenous
contrast. Multiplanar CT image reconstructions and MIPs were
obtained to evaluate the vascular anatomy. Multidetector CT imaging
of the abdomen and pelvis was performed using the standard protocol
during bolus administration of intravenous contrast.
CONTRAST:  80mL OMNIPAQUE IOHEXOL 350 MG/ML SOLN

[Series 5: thins · axial · 0.68mm/px · z∈[-306,-48]mm · 15 of 289 slices shown]
[im 16/289  lung]
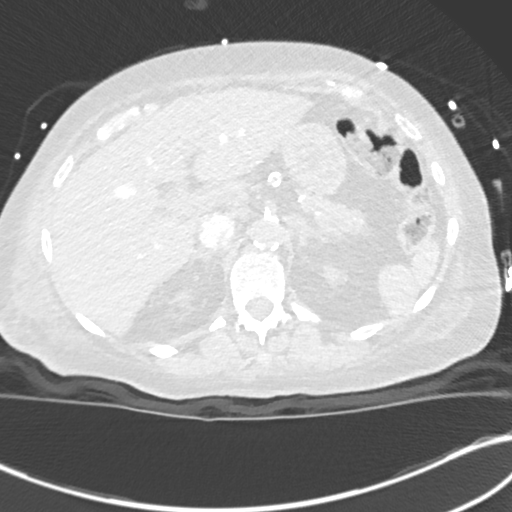
[im 31/289  soft-tissue]
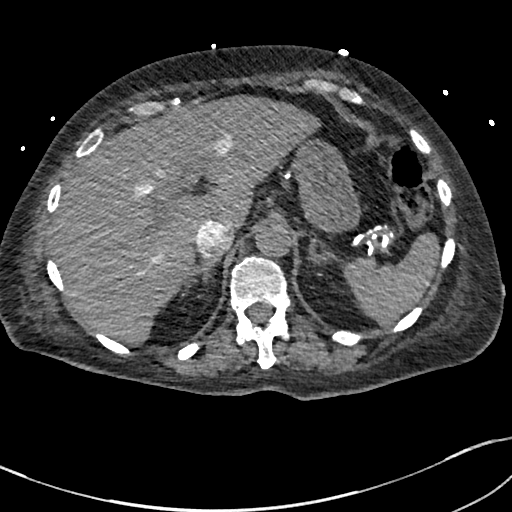
[im 61/289  lung]
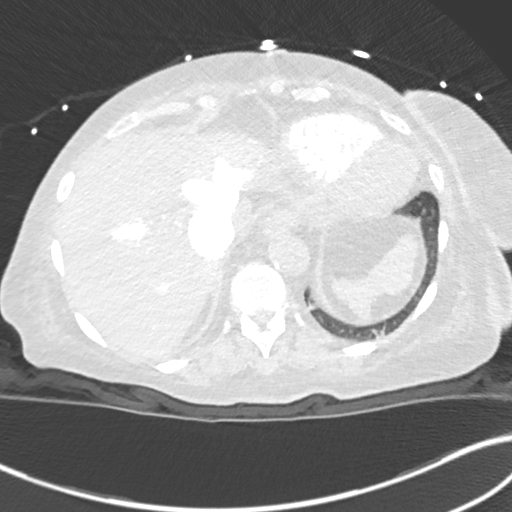
[im 76/289  soft-tissue]
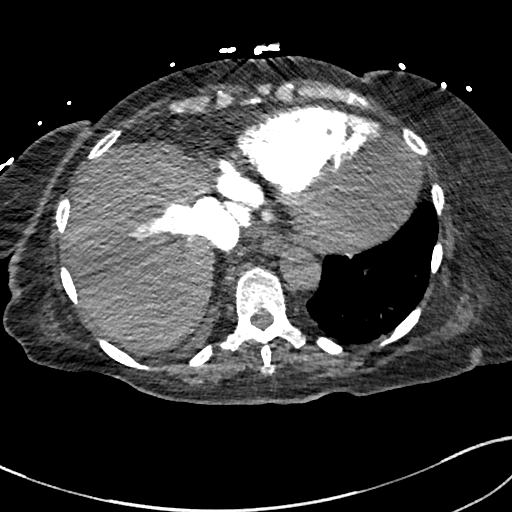
[im 91/289  lung]
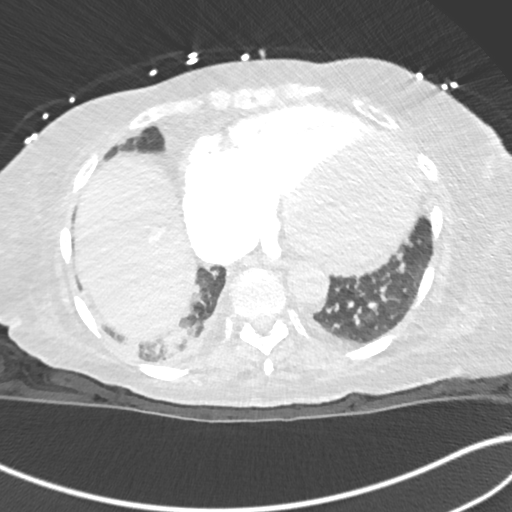
[im 107/289  soft-tissue]
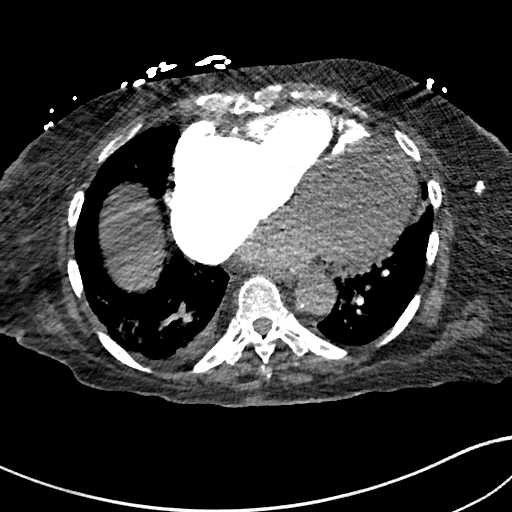
[im 122/289  lung]
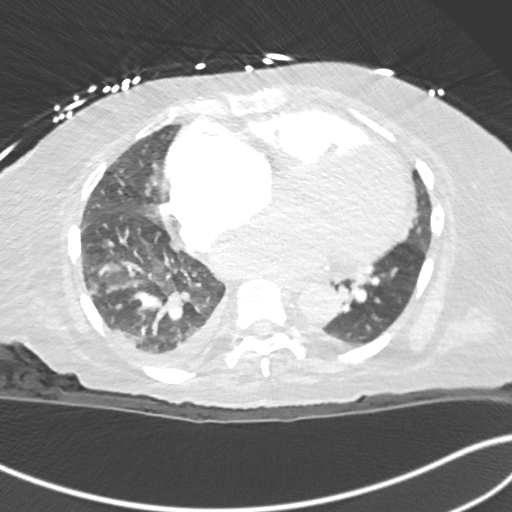
[im 152/289  soft-tissue]
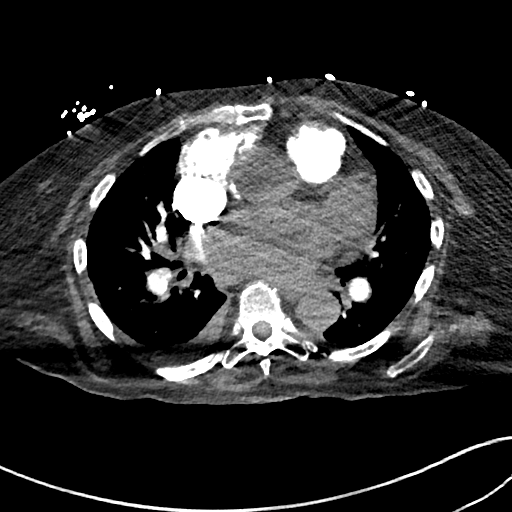
[im 167/289  lung]
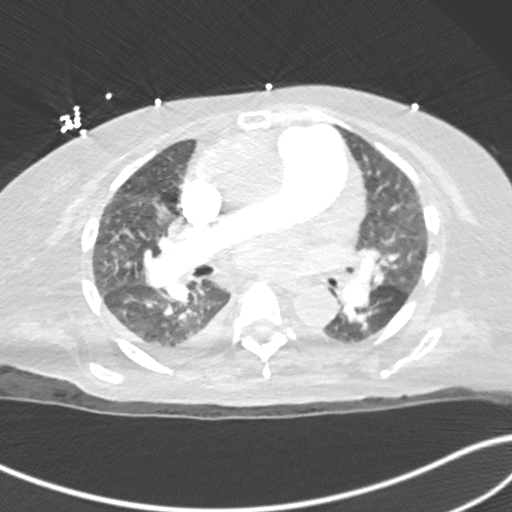
[im 182/289  soft-tissue]
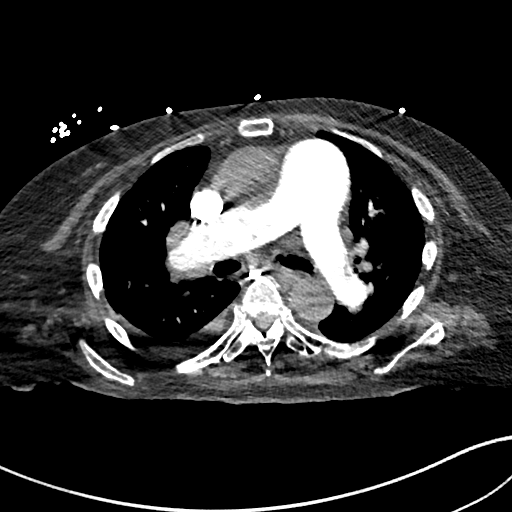
[im 198/289  lung]
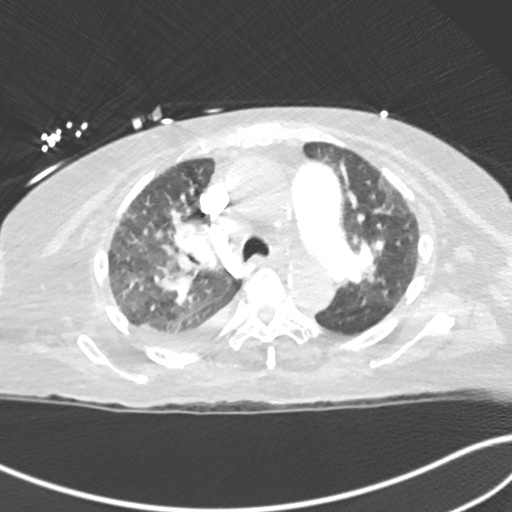
[im 213/289  soft-tissue]
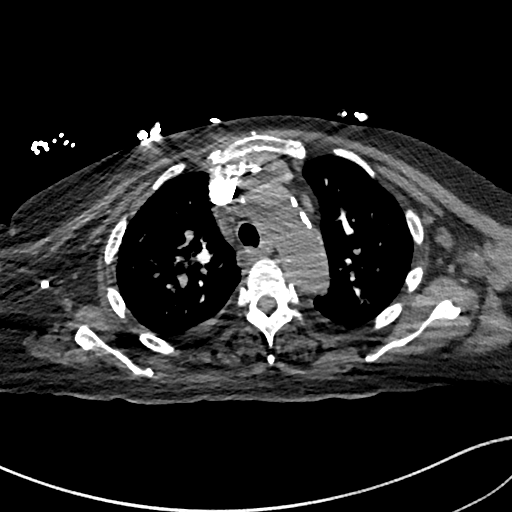
[im 243/289  lung]
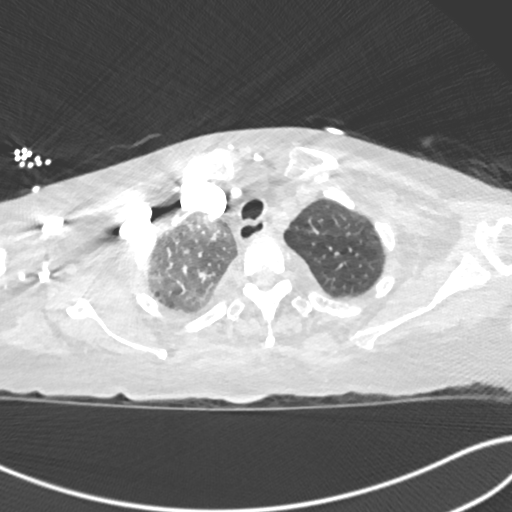
[im 258/289  soft-tissue]
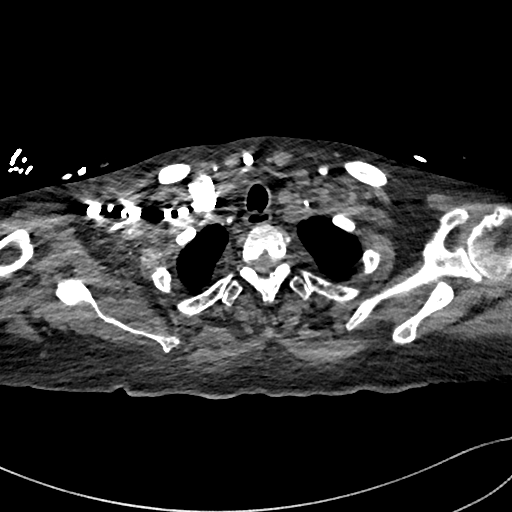
[im 273/289  lung]
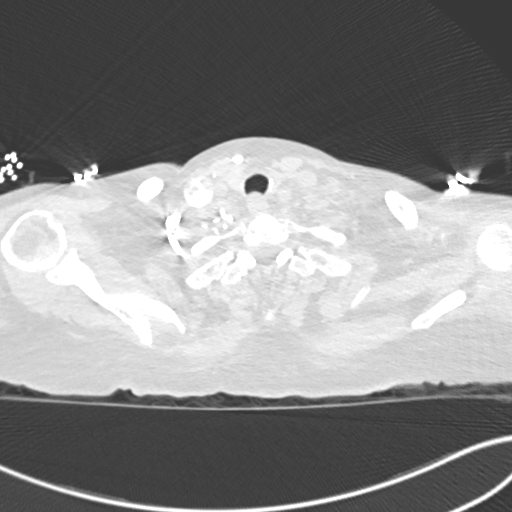

[Series 7: coronal mpr · coronal · 0.59mm/px · 2 of 81 slices shown]
[im 27/81  soft-tissue]
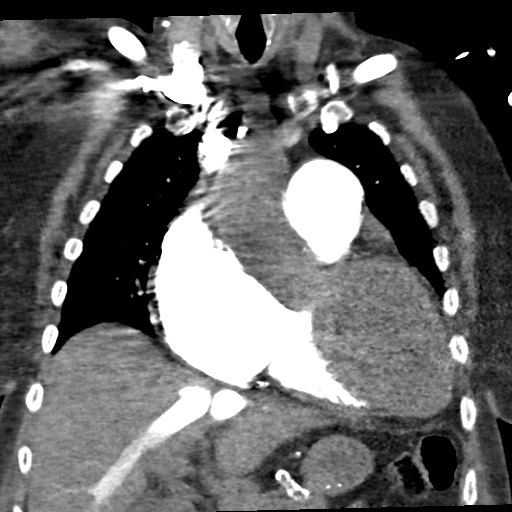
[im 54/81  soft-tissue]
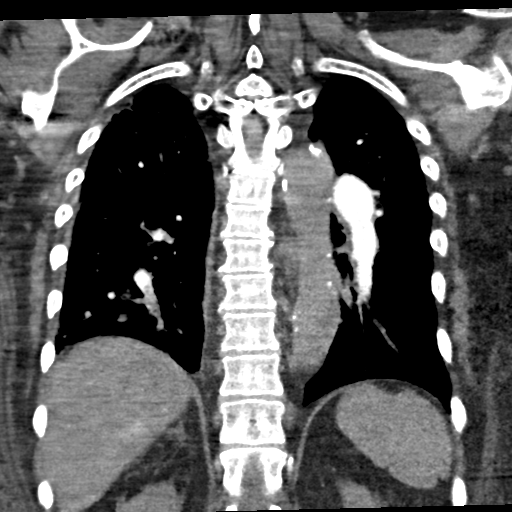

[17 of 46 positions shown; findings below may reference images not displayed]

FINDINGS: CTA CHEST FINDINGS

Cardiovascular: Satisfactory opacification of the pulmonary arteries
to the segmental level. There is some mixing artifact within the
left and right pulmonary arteries, but there is also some chronic
appearing wall adherent thrombus in the mid right pulmonary artery
(series 7, image 34), distal left pulmonary artery (series 4, image
37; series 7, image 49), and a right lower lobe segmental pulmonary
artery (series 4, image 56). Markedly enlarged pulmonary arteries.
Marked cardiomegaly. No pericardial effusion. No thoracic aortic
aneurysm. Coronary, aortic arch, and branch vessel atherosclerotic
vascular disease. Reflux of contrast into the IVC and hepatic veins.

Mediastinum/Nodes: No enlarged mediastinal, hilar, or axillary lymph
nodes. Thyroid gland, trachea, and esophagus demonstrate no
significant findings.

Lungs/Pleura: Small right pleural effusion. Subsegmental atelectasis
in the right lower lobe. No consolidation or pneumothorax.

Musculoskeletal: No chest wall abnormality. No acute or significant
osseous findings.

Review of the MIP images confirms the above findings.

CT ABDOMEN AND PELVIS FINDINGS

Hepatobiliary: No focal liver abnormality. Multiple small gallstones
again noted. No gallbladder wall thickening or biliary dilatation.

Pancreas: Unremarkable. No pancreatic ductal dilatation or
surrounding inflammatory changes.

Spleen: Normal in size without focal abnormality.

Adrenals/Urinary Tract: Adrenal glands are unremarkable. Mild
bilateral renal atrophy. No renal lesion, calculi, or
hydronephrosis. The bladder is unremarkable.

Stomach/Bowel: Stomach is within normal limits. Appendix appears
normal. No evidence of bowel wall thickening, distention, or
inflammatory changes. Left-sided colonic diverticulosis. Large stool
ball in the rectum.

Vascular/Lymphatic: Aortic atherosclerosis. No enlarged abdominal or
pelvic lymph nodes.

Reproductive: Multiple uterine fibroids, some calcified. No adnexal
mass.

Other: No free fluid or pneumoperitoneum. Moderate fat containing
paraumbilical and infraumbilical ventral abdominal hernias.

Musculoskeletal: No acute or significant osseous findings.

Review of the MIP images confirms the above findings.
IMPRESSION: CHEST:

1. No evidence of acute pulmonary embolism. Small volume chronic
appearing wall adherent thrombus in the mid right pulmonary artery,
distal left pulmonary artery, and a right lower lobe segmental
pulmonary artery.
2. Markedly enlarged pulmonary arteries with reflux of contrast into
the IVC and hepatic veins, consistent with right heart dysfunction.
3. Marked cardiomegaly.
4. Small right pleural effusion.
5.  Aortic atherosclerosis ([id number]).

ABDOMEN AND PELVIS:

1. No acute intra-abdominal process.
2. Cholelithiasis.
3. Large stool ball in the rectum. Correlate for fecal impaction.
4. Fibroid uterus.
